# Patient Record
Sex: Male | Born: 1950 | Race: Black or African American | Hispanic: No | Marital: Single | Smoking: Never smoker
Health system: Southern US, Community
[De-identification: ages and names within clinical notes are randomized; demographics above are authoritative.]

## PROBLEM LIST (undated history)

## (undated) DIAGNOSIS — F79 Unspecified intellectual disabilities: Secondary | ICD-10-CM

## (undated) DIAGNOSIS — F209 Schizophrenia, unspecified: Secondary | ICD-10-CM

---

## 2011-04-20 DIAGNOSIS — J209 Acute bronchitis, unspecified: Secondary | ICD-10-CM | POA: Diagnosis not present

## 2011-04-24 DIAGNOSIS — F259 Schizoaffective disorder, unspecified: Secondary | ICD-10-CM | POA: Diagnosis not present

## 2011-05-08 DIAGNOSIS — J209 Acute bronchitis, unspecified: Secondary | ICD-10-CM | POA: Diagnosis not present

## 2011-05-18 DIAGNOSIS — H1130 Conjunctival hemorrhage, unspecified eye: Secondary | ICD-10-CM | POA: Diagnosis not present

## 2011-05-29 ENCOUNTER — Encounter (HOSPITAL_BASED_OUTPATIENT_CLINIC_OR_DEPARTMENT_OTHER): Payer: Self-pay | Admitting: Emergency Medicine

## 2011-05-29 ENCOUNTER — Emergency Department (HOSPITAL_BASED_OUTPATIENT_CLINIC_OR_DEPARTMENT_OTHER)
Admission: EM | Admit: 2011-05-29 | Discharge: 2011-05-29 | Disposition: A | Discharge status: Home / Self Care | Payer: Medicare Other | Attending: Emergency Medicine | Admitting: Emergency Medicine

## 2011-05-29 ENCOUNTER — Emergency Department (INDEPENDENT_AMBULATORY_CARE_PROVIDER_SITE_OTHER): Payer: Medicare Other

## 2011-05-29 DIAGNOSIS — R197 Diarrhea, unspecified: Secondary | ICD-10-CM | POA: Diagnosis not present

## 2011-05-29 DIAGNOSIS — E860 Dehydration: Secondary | ICD-10-CM

## 2011-05-29 DIAGNOSIS — R630 Anorexia: Secondary | ICD-10-CM | POA: Insufficient documentation

## 2011-05-29 DIAGNOSIS — F79 Unspecified intellectual disabilities: Secondary | ICD-10-CM | POA: Insufficient documentation

## 2011-05-29 DIAGNOSIS — F209 Schizophrenia, unspecified: Secondary | ICD-10-CM | POA: Diagnosis not present

## 2011-05-29 HISTORY — DX: Schizophrenia, unspecified: F20.9

## 2011-05-29 HISTORY — DX: Unspecified intellectual disabilities: F79

## 2011-05-29 LAB — COMPREHENSIVE METABOLIC PANEL
Albumin: 3.4 g/dL — ABNORMAL LOW (ref 3.5–5.2)
Alkaline Phosphatase: 80 U/L (ref 39–117)
BUN: 20 mg/dL (ref 6–23)
Potassium: 3.6 mEq/L (ref 3.5–5.1)
Sodium: 137 mEq/L (ref 135–145)
Total Protein: 7.1 g/dL (ref 6.0–8.3)

## 2011-05-29 LAB — URINE MICROSCOPIC-ADD ON

## 2011-05-29 LAB — URINALYSIS, ROUTINE W REFLEX MICROSCOPIC
Glucose, UA: NEGATIVE mg/dL
Ketones, ur: 15 mg/dL — AB
Leukocytes, UA: NEGATIVE
Specific Gravity, Urine: 1.034 — ABNORMAL HIGH (ref 1.005–1.030)
pH: 5.5 (ref 5.0–8.0)

## 2011-05-29 LAB — DIFFERENTIAL
Basophils Relative: 0 % (ref 0–1)
Eosinophils Absolute: 0.1 10*3/uL (ref 0.0–0.7)
Monocytes Relative: 7 % (ref 3–12)
Neutrophils Relative %: 83 % — ABNORMAL HIGH (ref 43–77)

## 2011-05-29 LAB — CBC
MCH: 32.3 pg (ref 26.0–34.0)
MCHC: 35.3 g/dL (ref 30.0–36.0)
Platelets: 154 10*3/uL (ref 150–400)

## 2011-05-29 MED ORDER — HALOPERIDOL LACTATE 5 MG/ML IJ SOLN
10.0000 mg | Freq: Once | INTRAMUSCULAR | Status: AC
  Administered 2011-05-29: 10 mg via INTRAMUSCULAR
  Filled 2011-05-29: qty 2

## 2011-05-29 MED ORDER — SODIUM CHLORIDE 0.9 % IV BOLUS (SEPSIS)
1000.0000 mL | Freq: Once | INTRAVENOUS | Status: AC
  Administered 2011-05-29: 1000 mL via INTRAVENOUS

## 2011-05-29 MED ORDER — LORAZEPAM 2 MG/ML IJ SOLN
2.0000 mg | Freq: Once | INTRAMUSCULAR | Status: AC
  Administered 2011-05-29: 2 mg via INTRAMUSCULAR
  Filled 2011-05-29: qty 1

## 2011-05-29 NOTE — ED Notes (Signed)
Eating mac and cheese. Drinking sprite

## 2011-05-29 NOTE — ED Notes (Signed)
Pt's caregiver reports pt has had a decreased appetite since yesterday; pt uncooperative in triage, but per caregiver, this is his baseline

## 2011-05-29 NOTE — Discharge Instructions (Signed)
Dehydration, Adult Dehydration is when you lose more fluids from the body than you take in. Vital organs like the kidneys, brain, and heart cannot function without a proper amount of fluids and salt. Any loss of fluids from the body can cause dehydration.  CAUSES   Vomiting.   Diarrhea.   Excessive sweating.   Excessive urine output.   Fever.  SYMPTOMS  Mild dehydration  Thirst.   Dry lips.   Slightly dry mouth.  Moderate dehydration  Very dry mouth.   Sunken eyes.   Skin does not bounce back quickly when lightly pinched and released.   Dark urine and decreased urine production.   Decreased tear production.   Headache.  Severe dehydration  Very dry mouth.   Extreme thirst.   Rapid, weak pulse (more than 100 beats per minute at rest).   Cold hands and feet.   Not able to sweat in spite of heat and temperature.   Rapid breathing.   Blue lips.   Confusion and lethargy.   Difficulty being awakened.   Minimal urine production.   No tears.  DIAGNOSIS  Your caregiver will diagnose dehydration based on your symptoms and your exam. Blood and urine tests will help confirm the diagnosis. The diagnostic evaluation should also identify the cause of dehydration. TREATMENT  Treatment of mild or moderate dehydration can often be done at home by increasing the amount of fluids that you drink. It is best to drink small amounts of fluid more often. Drinking too much at one time can make vomiting worse. Refer to the home care instructions below. Severe dehydration needs to be treated at the hospital where you will probably be given intravenous (IV) fluids that contain water and electrolytes. HOME CARE INSTRUCTIONS   Ask your caregiver about specific rehydration instructions.   Drink enough fluids to keep your urine clear or pale yellow.   Drink small amounts frequently if you have nausea and vomiting.   Eat as you normally do.   Avoid:   Foods or drinks high in  sugar.   Carbonated drinks.   Juice.   Extremely hot or cold fluids.   Drinks with caffeine.   Fatty, greasy foods.   Alcohol.   Tobacco.   Overeating.   Gelatin desserts.   Wash your hands well to avoid spreading bacteria and viruses.   Only take over-the-counter or prescription medicines for pain, discomfort, or fever as directed by your caregiver.   Ask your caregiver if you should continue all prescribed and over-the-counter medicines.   Keep all follow-up appointments with your caregiver.  SEEK MEDICAL CARE IF:  You have abdominal pain and it increases or stays in one area (localizes).   You have a rash, stiff neck, or severe headache.   You are irritable, sleepy, or difficult to awaken.   You are weak, dizzy, or extremely thirsty.  SEEK IMMEDIATE MEDICAL CARE IF:   You are unable to keep fluids down or you get worse despite treatment.   You have frequent episodes of vomiting or diarrhea.   You have blood or green matter (bile) in your vomit.   You have blood in your stool or your stool looks black and tarry.   You have not urinated in 6 to 8 hours, or you have only urinated a small amount of very dark urine.   You have a fever.   You faint.  MAKE SURE YOU:   Understand these instructions.   Will watch your condition.     Will get help right away if you are not doing well or get worse.  Document Released: 02/26/2005 Document Revised: 02/15/2011 Document Reviewed: 10/16/2010 [employer] Patient Information 2012 [employer]. 

## 2011-05-29 NOTE — ED Provider Notes (Signed)
History     CSN: [id number]  Arrival date & time 05/29/11  1613   First MD Initiated Contact with Patient 05/29/11 1707      Chief Complaint  Patient presents with  . Anorexia   Level 5 caveat baseline cognitive disfunction,   (Consider location/radiation/quality/duration/timing/severity/associated sxs/prior treatment) HPI  61yoM h/o schizophrenia, MR pw dec appetite. Per caregiver pt refusing to eat since yesterday. Only intermittently taking his medications. No other abnormal behavior, does not appear to be in pain. States that he has experienced 2-3 episodes of loose stool/diarrhea today. No vomiting. Does not appear to be in pain. No documented fever but "felt warm" earlier today per caregiver.  ED Notes, ED Provider Notes from 05/29/11 0000 to 05/29/11 16:32:07       Estelle June, RN 05/29/2011 16:30      Attempt to obtain VS by different staff members x 2 unsuccessful- charge nurse made aware         Estelle June, RN 05/29/2011 16:23      Pt's caregiver reports pt has had a decreased appetite since yesterday; pt uncooperative in triage, but per caregiver, this is his baseline    Past Medical History  Diagnosis Date  . Schizophrenia   . MR (mental retardation)     History reviewed. No pertinent past surgical history.  No family history on file.  History  Substance Use Topics  . Smoking status: Never Smoker   . Smokeless tobacco: Not on file  . Alcohol Use: No      Review of Systems  Unable to perform ROS: Psychiatric disorder   Unable to obtain   Allergies  Review of patient's allergies indicates no known allergies.  Home Medications   Current Outpatient Rx  Name Route Sig Dispense Refill  . BENZTROPINE MESYLATE 2 MG PO TABS Oral Take 2 mg by mouth daily.    Marland Kitchen HALOPERIDOL 5 MG PO TABS Oral Take 25 mg by mouth at bedtime.    Marland Kitchen LAMOTRIGINE 100 MG PO TABS Oral Take 100 mg by mouth 2 (two) times daily.    Marland Kitchen LAMOTRIGINE 25 MG PO TABS Oral Take  25-50 mg by mouth 2 (two) times daily. 1 in the morning and 2 at bedtime    . OMEPRAZOLE 20 MG PO CPDR Oral Take 20 mg by mouth daily.    Marland Kitchen OXCARBAZEPINE 300 MG PO TABS Oral Take 300-600 mg by mouth 2 (two) times daily. 1 tablet in the morning and 2 in the evening    . CALCIUM POLYCARBOPHIL 625 MG PO TABS Oral Take 625 mg by mouth daily.    Marland Kitchen POTASSIUM CHLORIDE CRYS ER 10 MEQ PO TBCR Oral Take 10 mEq by mouth every morning.    Marland Kitchen RISPERIDONE 2 MG PO TABS Oral Take 2 mg by mouth 2 (two) times daily. For agitation    . RISPERIDONE 3 MG PO TABS Oral Take 3 mg by mouth 2 (two) times daily.    . TRAZODONE HCL 100 MG PO TABS Oral Take 100 mg by mouth at bedtime.      BP 138/70  Pulse 91  Resp 20  SpO2 100%  Physical Exam  Constitutional: He appears well-developed.  HENT:       Mm dry, refusing to open mouth, clenching down No stridor No neck LAD  Eyes: Pupils are equal, round, and reactive to light.  Neck: Neck supple.  Cardiovascular: Normal rate, regular rhythm and normal heart sounds.   Pulmonary/Chest: Effort normal.  No respiratory distress. He has no wheezes. He has no rales.  Abdominal: Soft. He exhibits no distension. There is no tenderness. There is no rebound and no guarding.  Musculoskeletal: He exhibits no edema and no tenderness.  Lymphadenopathy:    He has no cervical adenopathy.  Skin: No rash noted.    ED Course  Procedures (including critical care time)  Labs Reviewed  CBC - Abnormal; Notable for the following:    RBC 3.93 (*)    Hemoglobin 12.7 (*)    HCT 36.0 (*)    All other components within normal limits  DIFFERENTIAL - Abnormal; Notable for the following:    Neutrophils Relative 83 (*)    Lymphocytes Relative 9 (*)    Lymphs Abs 0.5 (*)    All other components within normal limits  COMPREHENSIVE METABOLIC PANEL - Abnormal; Notable for the following:    Albumin 3.4 (*)    AST 40 (*)    GFR calc non Af Amer 65 (*)    GFR calc Af Amer 75 (*)    All other  components within normal limits  URINALYSIS, ROUTINE W REFLEX MICROSCOPIC - Abnormal; Notable for the following:    Color, Urine AMBER (*) BIOCHEMICALS MAY BE AFFECTED BY COLOR   Specific Gravity, Urine 1.034 (*)    Hgb urine dipstick MODERATE (*)    Bilirubin Urine SMALL (*)    Ketones, ur 15 (*)    Protein, ur 30 (*)    All other components within normal limits  URINE MICROSCOPIC-ADD ON   Dg Chest Portable 1 View  05/29/2011  *RADIOLOGY REPORT*  Clinical Data: Decreased appetite.  Diarrhea.  PORTABLE CHEST - 1 VIEW  Comparison: 10/09/2003  Findings: Exam detail diminished due to rotational artifact.  Heart size and mediastinal contours are normal.  No pleural effusion or pulmonary edema.  Diminished lung volumes.  IMPRESSION:  1.  No acute findings identified. 2.  Rotational artifact.  Original Report Authenticated By: Rosealee Albee, M.D.    1. Poor appetite   2. Diarrhea   3. Dehydration     MDM  Suboptimal ED evaluation. Pt agitated and hostile, not allowing for complete vitals. Given  Haldol IM and subsequent Ativan for sedation, which was minimal. Did allow for Labs to be drawn and for I/O cath urine, IVF. Labs reviewed and generally unremarkable. Hgb likely 2/2 I/O cath. Small bili but total bili unremarkable. Unable to obtain adequate view of posterior OP or TM b/l but pt in NAD. Tolerating PO prior to discharge. I do not feel that further sedation would benefit the patient at this time. Caregiver advised to f/u with his PMD. Given CXR results as well as lab results. Pt stable for discharge home.        Forbes Cellar, MD 05/30/11 [phone]

## 2011-05-29 NOTE — ED Notes (Signed)
Was able to clean stool from pt after he was incontinent. He is still too combative to start an IV and draw blood.

## 2011-05-29 NOTE — ED Notes (Signed)
Attempt to obtain VS by different staff members x 2 unsuccessful- charge nurse made aware

## 2011-05-29 NOTE — ED Notes (Signed)
Pt was able to take 2 sips of his crystal light drink. Pt is aggressive towards staff and only follows some direction. Removed his iv himself

## 2011-05-29 NOTE — ED Notes (Signed)
Pt is ambulatory to treatment area with caregiver. Very active. Becomes agitated if anyone moves toward him to attempt VS or other care.

## 2011-06-17 ENCOUNTER — Encounter (HOSPITAL_BASED_OUTPATIENT_CLINIC_OR_DEPARTMENT_OTHER): Payer: Self-pay | Admitting: Emergency Medicine

## 2011-06-17 ENCOUNTER — Emergency Department (INDEPENDENT_AMBULATORY_CARE_PROVIDER_SITE_OTHER): Payer: Medicare Other

## 2011-06-17 ENCOUNTER — Emergency Department (HOSPITAL_BASED_OUTPATIENT_CLINIC_OR_DEPARTMENT_OTHER)
Admission: EM | Admit: 2011-06-17 | Discharge: 2011-06-17 | Disposition: A | Discharge status: Home / Self Care | Payer: Medicare Other | Attending: Emergency Medicine | Admitting: Emergency Medicine

## 2011-06-17 DIAGNOSIS — F209 Schizophrenia, unspecified: Secondary | ICD-10-CM | POA: Insufficient documentation

## 2011-06-17 DIAGNOSIS — R05 Cough: Secondary | ICD-10-CM

## 2011-06-17 DIAGNOSIS — Z79899 Other long term (current) drug therapy: Secondary | ICD-10-CM | POA: Diagnosis not present

## 2011-06-17 DIAGNOSIS — R197 Diarrhea, unspecified: Secondary | ICD-10-CM | POA: Diagnosis not present

## 2011-06-17 DIAGNOSIS — F79 Unspecified intellectual disabilities: Secondary | ICD-10-CM | POA: Insufficient documentation

## 2011-06-17 DIAGNOSIS — J40 Bronchitis, not specified as acute or chronic: Secondary | ICD-10-CM

## 2011-06-17 DIAGNOSIS — R059 Cough, unspecified: Secondary | ICD-10-CM

## 2011-06-17 DIAGNOSIS — J209 Acute bronchitis, unspecified: Secondary | ICD-10-CM | POA: Diagnosis not present

## 2011-06-17 MED ORDER — AZITHROMYCIN 250 MG PO TABS: 250.0000 mg | ORAL_TABLET | Freq: Every day | ORAL | Status: AC

## 2011-06-17 MED ORDER — DIPHENOXYLATE-ATROPINE 2.5-0.025 MG PO TABS
1.0000 | ORAL_TABLET | Freq: Once | ORAL | Status: AC
  Administered 2011-06-17: 1 via ORAL
  Filled 2011-06-17: qty 1

## 2011-06-17 MED ORDER — DIPHENOXYLATE-ATROPINE 2.5-0.025 MG PO TABS: 1.0000 | ORAL_TABLET | Freq: Four times a day (QID) | ORAL | Status: AC | PRN

## 2011-06-17 NOTE — ED Notes (Signed)
Pt uncooperative, unable to obtain full set VS

## 2011-06-17 NOTE — Discharge Instructions (Signed)
Bronchitis Bronchitis is the body's way of reacting to injury and/or infection (inflammation) of the bronchi. Bronchi are the air tubes that extend from the windpipe into the lungs. If the inflammation becomes severe, it may cause shortness of breath. CAUSES  Inflammation may be caused by:  A virus.   Germs (bacteria).   Dust.   Allergens.   Pollutants and many other irritants.  The cells lining the bronchial tree are covered with tiny hairs (cilia). These constantly beat upward, away from the lungs, toward the mouth. This keeps the lungs free of pollutants. When these cells become too irritated and are unable to do their job, mucus begins to develop. This causes the characteristic cough of bronchitis. The cough clears the lungs when the cilia are unable to do their job. Without either of these protective mechanisms, the mucus would settle in the lungs. Then you would develop pneumonia. Smoking is a common cause of bronchitis and can contribute to pneumonia. Stopping this habit is the single most important thing you can do to help yourself. TREATMENT   Your caregiver may prescribe an antibiotic if the cough is caused by bacteria. Also, medicines that open up your airways make it easier to breathe. Your caregiver may also recommend or prescribe an expectorant. It will loosen the mucus to be coughed up. Only take over-the-counter or prescription medicines for pain, discomfort, or fever as directed by your caregiver.   Removing whatever causes the problem (smoking, for example) is critical to preventing the problem from getting worse.   Cough suppressants may be prescribed for relief of cough symptoms.   Inhaled medicines may be prescribed to help with symptoms now and to help prevent problems from returning.   For those with recurrent (chronic) bronchitis, there may be a need for steroid medicines.  SEEK IMMEDIATE MEDICAL CARE IF:   During treatment, you develop more pus-like mucus  (purulent sputum).   You have a fever.   Your baby is older than 3 months with a rectal temperature of 102 F (38.9 C) or higher.   Your baby is 3 months old or younger with a rectal temperature of 100.4 F (38 C) or higher.   You become progressively more ill.   You have increased difficulty breathing, wheezing, or shortness of breath.  It is necessary to seek immediate medical care if you are elderly or sick from any other disease. MAKE SURE YOU:   Understand these instructions.   Will watch your condition.   Will get help right away if you are not doing well or get worse.  Document Released: 02/26/2005 Document Revised: 02/15/2011 Document Reviewed: 01/06/2008 [employer] Patient Information 2012 [employer].Diarrhea Infections caused by germs (bacterial) or a virus commonly cause diarrhea. Your caregiver has determined that with time, rest and fluids, the diarrhea should improve. In general, eat normally while drinking more water than usual. Although water may prevent dehydration, it does not contain salt and minerals (electrolytes). Broths, weak tea without caffeine and oral rehydration solutions (ORS) replace fluids and electrolytes. Small amounts of fluids should be taken frequently. Large amounts at one time may not be tolerated. Plain water may be harmful in infants and the elderly. Oral rehydrating solutions (ORS) are available at pharmacies and grocery stores. ORS replace water and important electrolytes in proper proportions. Sports drinks are not as effective as ORS and may be harmful due to sugars worsening diarrhea.  ORS is especially recommended for use in children with diarrhea. As a general guideline for   children, replace any new fluid losses from diarrhea and/or vomiting with ORS as follows:   If your child weighs 22 pounds or under (10 kg or less), give 60-120 mL ( -  cup or 2 - 4 ounces) of ORS for each episode of diarrheal stool or vomiting episode.   If your  child weighs more than 22 pounds (more than 10 kgs), give 120-240 mL ( - 1 cup or 4 - 8 ounces) of ORS for each diarrheal stool or episode of vomiting.   While correcting for dehydration, children should eat normally. However, foods high in sugar should be avoided because this may worsen diarrhea. Large amounts of carbonated soft drinks, juice, gelatin desserts and other highly sugared drinks should be avoided.   After correction of dehydration, other liquids that are appealing to the child may be added. Children should drink small amounts of fluids frequently and fluids should be increased as tolerated. Children should drink enough fluids to keep urine clear or pale yellow.   Adults should eat normally while drinking more fluids than usual. Drink small amounts of fluids frequently and increase as tolerated. Drink enough fluids to keep urine clear or pale yellow. Broths, weak decaffeinated tea, lemon lime soft drinks (allowed to go flat) and ORS replace fluids and electrolytes.   Avoid:   Carbonated drinks.   Juice.   Extremely hot or cold fluids.   Caffeine drinks.   Fatty, greasy foods.   Alcohol.   Tobacco.   Too much intake of anything at one time.   Gelatin desserts.   Probiotics are active cultures of beneficial bacteria. They may lessen the amount and number of diarrheal stools in adults. Probiotics can be found in yogurt with active cultures and in supplements.   Wash hands well to avoid spreading bacteria and virus.   Anti-diarrheal medications are not recommended for infants and children.   Only take over-the-counter or prescription medicines for pain, discomfort or fever as directed by your caregiver. Do not give aspirin to children because it may cause Reye's Syndrome.   For adults, ask your caregiver if you should continue all prescribed and over-the-counter medicines.   If your caregiver has given you a follow-up appointment, it is very important to keep that  appointment. Not keeping the appointment could result in a chronic or permanent injury, and disability. If there is any problem keeping the appointment, you must call back to this facility for assistance.  SEEK IMMEDIATE MEDICAL CARE IF:   You or your child is unable to keep fluids down or other symptoms or problems become worse in spite of treatment.   Vomiting or diarrhea develops and becomes persistent.   There is vomiting of blood or bile (green material).   There is blood in the stool or the stools are black and tarry.   There is no urine output in 6-8 hours or there is only a small amount of very dark urine.   Abdominal pain develops, increases or localizes.   You have a fever.   Your baby is older than 3 months with a rectal temperature of 102 F (38.9 C) or higher.   Your baby is 3 months old or younger with a rectal temperature of 100.4 F (38 C) or higher.   You or your child develops excessive weakness, dizziness, fainting or extreme thirst.   You or your child develops a rash, stiff neck, severe headache or become irritable or sleepy and difficult to awaken.  MAKE SURE YOU:     Understand these instructions.   Will watch your condition.   Will get help right away if you are not doing well or get worse.  Document Released: 02/16/2002 Document Revised: 02/15/2011 Document Reviewed: 01/03/2009 [employer] Patient Information 2012 [employer]. 

## 2011-06-17 NOTE — ED Notes (Signed)
Pt caregiver reports pt has had 2 episodes of diarrhea this am

## 2011-06-17 NOTE — ED Provider Notes (Signed)
History     CSN: [id number]  Arrival date & time 06/17/11  0857   First MD Initiated Contact with Patient 06/17/11 0901      Chief Complaint  Patient presents with  . Diarrhea    (Consider location/radiation/quality/duration/timing/severity/associated sxs/prior treatment) HPI Comments: Has been on Lomotil in the past which has helped. Has also had a cold for the past 4 days. Level V caveat secondary to MR. History was obtained from the patient's caregiver.  Patient is a 61 y.o. male presenting with diarrhea. The history is provided by a caregiver. No language interpreter was used.  Diarrhea The primary symptoms include diarrhea. Primary symptoms do not include fever, fatigue or abdominal pain. The illness began today. The onset was gradual. The problem has been gradually worsening.  The diarrhea began today. The diarrhea is watery. The diarrhea occurs 2 to 4 times per day.  The illness does not include anorexia, constipation or back pain.    Past Medical History  Diagnosis Date  . Schizophrenia   . MR (mental retardation)     History reviewed. No pertinent past surgical history.  No family history on file.  History  Substance Use Topics  . Smoking status: Never Smoker   . Smokeless tobacco: Not on file  . Alcohol Use: No      Review of Systems  Unable to perform ROS: Psychiatric disorder  Constitutional: Negative for fever and fatigue.  HENT: Positive for congestion and rhinorrhea.   Respiratory: Positive for cough.   Gastrointestinal: Positive for diarrhea. Negative for abdominal pain, constipation and anorexia.  Musculoskeletal: Negative for back pain.    Allergies  Review of patient's allergies indicates no known allergies.  Home Medications   Current Outpatient Rx  Name Route Sig Dispense Refill  . AZITHROMYCIN 250 MG PO TABS Oral Take 1 tablet (250 mg total) by mouth daily. Take first 2 tablets together, then 1 every day until finished. 6 tablet 0  .  BENZTROPINE MESYLATE 2 MG PO TABS Oral Take 2 mg by mouth daily.    Marland Kitchen DIPHENOXYLATE-ATROPINE 2.5-0.025 MG PO TABS Oral Take 1 tablet by mouth 4 (four) times daily as needed for diarrhea or loose stools. 30 tablet 0  . HALOPERIDOL 5 MG PO TABS Oral Take 25 mg by mouth at bedtime.    Marland Kitchen LAMOTRIGINE 100 MG PO TABS Oral Take 100 mg by mouth 2 (two) times daily.    Marland Kitchen LAMOTRIGINE 25 MG PO TABS Oral Take 25-50 mg by mouth 2 (two) times daily. 1 in the morning and 2 at bedtime    . OMEPRAZOLE 20 MG PO CPDR Oral Take 20 mg by mouth daily.    Marland Kitchen OXCARBAZEPINE 300 MG PO TABS Oral Take 300-600 mg by mouth 2 (two) times daily. 1 tablet in the morning and 2 in the evening    . CALCIUM POLYCARBOPHIL 625 MG PO TABS Oral Take 625 mg by mouth daily.    Marland Kitchen POTASSIUM CHLORIDE CRYS ER 10 MEQ PO TBCR Oral Take 10 mEq by mouth every morning.    Marland Kitchen RISPERIDONE 2 MG PO TABS Oral Take 2 mg by mouth 2 (two) times daily. For agitation    . RISPERIDONE 3 MG PO TABS Oral Take 3 mg by mouth 2 (two) times daily.    . TRAZODONE HCL 100 MG PO TABS Oral Take 100 mg by mouth at bedtime.      BP 146/81  Resp 20  Physical Exam  Constitutional: He appears well-developed and  well-nourished. No distress.  HENT:  Head: Normocephalic and atraumatic.  Mouth/Throat: Oropharynx is clear and moist.  Eyes: Conjunctivae and EOM are normal. Pupils are equal, round, and reactive to light.  Neck: Normal range of motion. Neck supple.  Cardiovascular: Normal rate, regular rhythm, normal heart sounds and intact distal pulses.  Exam reveals no gallop and no friction rub.   No murmur heard. Pulmonary/Chest: Effort normal and breath sounds normal. No respiratory distress. He exhibits no tenderness.  Abdominal: Soft. Bowel sounds are normal. There is no tenderness.  Musculoskeletal: Normal range of motion. He exhibits no edema and no tenderness.  Neurological:       MR unable to obtain history.  Little cooperation  Skin: Skin is warm and dry. No  rash noted.    ED Course  Procedures (including critical care time)  Labs Reviewed - No data to display Dg Chest 1 View  06/17/2011  *RADIOLOGY REPORT*  Clinical Data: Cough, diarrhea  CHEST - 1 VIEW  Comparison: 05/29/2011  Findings: Cardiomediastinal silhouette is unremarkable.  No acute infiltrate or pleural effusion.  Mild perihilar and infrahilar increased bronchial markings suspicious for mild bronchitic changes.  IMPRESSION: No acute infiltrate or pulmonary edema.  Mild perihilar and infrahilar increased bronchial markings suspicious for bronchitic changes.  Original Report Authenticated By: Natasha Mead, M.D.     1. Bronchitis   2. Diarrhea       MDM  Bronchitis and diarrhea. Is no indication for laboratory testing at this time. Chest x-ray with no evidence of pneumonia. Will be placed on azithromycin per the caregivers request. Will be prescribed Lomotil for diarrhea. Instructed to followup with his primary care physician.        Dayton Bailiff, MD 06/17/11 [phone]

## 2011-07-31 DIAGNOSIS — K529 Noninfective gastroenteritis and colitis, unspecified: Secondary | ICD-10-CM | POA: Diagnosis not present

## 2011-08-07 DIAGNOSIS — F259 Schizoaffective disorder, unspecified: Secondary | ICD-10-CM | POA: Diagnosis not present

## 2011-08-08 DIAGNOSIS — K529 Noninfective gastroenteritis and colitis, unspecified: Secondary | ICD-10-CM | POA: Diagnosis not present

## 2011-10-22 DIAGNOSIS — F259 Schizoaffective disorder, unspecified: Secondary | ICD-10-CM | POA: Diagnosis not present

## 2011-11-08 DIAGNOSIS — H1130 Conjunctival hemorrhage, unspecified eye: Secondary | ICD-10-CM | POA: Diagnosis not present

## 2011-11-08 DIAGNOSIS — S01119A Laceration without foreign body of unspecified eyelid and periocular area, initial encounter: Secondary | ICD-10-CM | POA: Diagnosis not present

## 2011-11-23 DIAGNOSIS — A09 Infectious gastroenteritis and colitis, unspecified: Secondary | ICD-10-CM | POA: Diagnosis not present

## 2012-01-09 DIAGNOSIS — R5381 Other malaise: Secondary | ICD-10-CM | POA: Diagnosis not present

## 2012-01-09 DIAGNOSIS — R5383 Other fatigue: Secondary | ICD-10-CM | POA: Diagnosis not present

## 2012-01-11 ENCOUNTER — Emergency Department (HOSPITAL_BASED_OUTPATIENT_CLINIC_OR_DEPARTMENT_OTHER)
Admission: EM | Admit: 2012-01-11 | Discharge: 2012-01-12 | Disposition: A | Discharge status: Home / Self Care | Payer: Medicare Other | Attending: Emergency Medicine | Admitting: Emergency Medicine

## 2012-01-11 ENCOUNTER — Encounter (HOSPITAL_BASED_OUTPATIENT_CLINIC_OR_DEPARTMENT_OTHER): Payer: Self-pay | Admitting: *Deleted

## 2012-01-11 DIAGNOSIS — Z79899 Other long term (current) drug therapy: Secondary | ICD-10-CM | POA: Diagnosis not present

## 2012-01-11 DIAGNOSIS — N390 Urinary tract infection, site not specified: Secondary | ICD-10-CM | POA: Diagnosis not present

## 2012-01-11 DIAGNOSIS — R3911 Hesitancy of micturition: Secondary | ICD-10-CM | POA: Insufficient documentation

## 2012-01-11 DIAGNOSIS — R338 Other retention of urine: Secondary | ICD-10-CM | POA: Insufficient documentation

## 2012-01-11 DIAGNOSIS — R319 Hematuria, unspecified: Secondary | ICD-10-CM | POA: Insufficient documentation

## 2012-01-11 DIAGNOSIS — F209 Schizophrenia, unspecified: Secondary | ICD-10-CM | POA: Insufficient documentation

## 2012-01-11 DIAGNOSIS — F79 Unspecified intellectual disabilities: Secondary | ICD-10-CM | POA: Diagnosis not present

## 2012-01-11 DIAGNOSIS — R339 Retention of urine, unspecified: Secondary | ICD-10-CM | POA: Diagnosis not present

## 2012-01-11 MED ORDER — LORAZEPAM 2 MG/ML IJ SOLN
1.0000 mg | Freq: Once | INTRAMUSCULAR | Status: AC
  Administered 2012-01-11: 1 mg via INTRAMUSCULAR

## 2012-01-11 MED ORDER — LORAZEPAM 2 MG/ML IJ SOLN
INTRAMUSCULAR | Status: AC
  Filled 2012-01-11: qty 1

## 2012-01-11 NOTE — ED Provider Notes (Signed)
History     CSN: [id number]  Arrival date & time 01/11/12  2314   First MD Initiated Contact with Patient 01/11/12 2336      Chief Complaint  Patient presents with  . Urinary Retention    (Consider location/radiation/quality/duration/timing/severity/associated sxs/prior treatment) Patient is a 61 y.o. male presenting with dysuria. The history is provided by a caregiver. The history is limited by the condition of the patient. No language interpreter was used.  Dysuria  This is a new problem. The current episode started 12 to 24 hours ago. Episode frequency: unable to pass urine today. The problem has not changed since onset.There has been no fever. He is not sexually active. There is no history of pyelonephritis. Associated symptoms include hematuria and hesitancy. Associated symptoms comments: retention. He has tried nothing for the symptoms. His past medical history does not include kidney stones.  Caregiver states he seems in pain when he attempts to urinate and has not been urinating today  Past Medical History  Diagnosis Date  . Schizophrenia   . MR (mental retardation)     History reviewed. No pertinent past surgical history.  History reviewed. No pertinent family history.  History  Substance Use Topics  . Smoking status: Never Smoker   . Smokeless tobacco: Not on file  . Alcohol Use: No      Review of Systems  Unable to perform ROS Genitourinary: Positive for dysuria, hesitancy and hematuria.    Allergies  Review of patient's allergies indicates no known allergies.  Home Medications   Current Outpatient Rx  Name Route Sig Dispense Refill  . BENZTROPINE MESYLATE 2 MG PO TABS Oral Take 2 mg by mouth daily.    Marland Kitchen HALOPERIDOL 5 MG PO TABS Oral Take 25 mg by mouth at bedtime.    Marland Kitchen LAMOTRIGINE 100 MG PO TABS Oral Take 100 mg by mouth 2 (two) times daily.    Marland Kitchen LAMOTRIGINE 25 MG PO TABS Oral Take 25-50 mg by mouth 2 (two) times daily. 1 in the morning and 2 at  bedtime    . OMEPRAZOLE 20 MG PO CPDR Oral Take 20 mg by mouth daily.    Marland Kitchen OXCARBAZEPINE 300 MG PO TABS Oral Take 300-600 mg by mouth 2 (two) times daily. 1 tablet in the morning and 2 in the evening    . CALCIUM POLYCARBOPHIL 625 MG PO TABS Oral Take 625 mg by mouth daily.    Marland Kitchen POTASSIUM CHLORIDE CRYS ER 10 MEQ PO TBCR Oral Take 10 mEq by mouth every morning.    Marland Kitchen RISPERIDONE 2 MG PO TABS Oral Take 2 mg by mouth 2 (two) times daily. For agitation    . RISPERIDONE 3 MG PO TABS Oral Take 3 mg by mouth 2 (two) times daily.    . TRAZODONE HCL 100 MG PO TABS Oral Take 100 mg by mouth at bedtime.      There were no vitals taken for this visit.  Physical Exam  Constitutional: He appears well-developed and well-nourished. No distress.  HENT:  Head: Normocephalic and atraumatic.  Right Ear: External ear normal.  Left Ear: External ear normal.  Eyes: Conjunctivae normal are normal. Pupils are equal, round, and reactive to light.  Neck: No tracheal deviation present.  Cardiovascular: Normal rate, regular rhythm and intact distal pulses.   Pulmonary/Chest: Effort normal and breath sounds normal. He has no wheezes. He has no rales.  Abdominal: Soft. Bowel sounds are normal. He exhibits no distension.  Musculoskeletal: Normal range of  motion.  Neurological: He is alert.  Skin: Skin is warm and dry.    ED Course  Procedures (including critical care time)   Labs Reviewed  CBC WITH DIFFERENTIAL  BASIC METABOLIC PANEL  URINALYSIS, MICROSCOPIC ONLY  URINALYSIS, ROUTINE W REFLEX MICROSCOPIC  URINE CULTURE   No results found.   No diagnosis found.    MDM  Urinary retention, suspect UTI as cause.  Follow up with urology for catheter removal.  Antibiotics started.  Culture sent       Larya Charpentier K Cason Luffman-Rasch, MD 01/12/12 [phone]

## 2012-01-11 NOTE — ED Notes (Signed)
Pt is from group home , care giver states pt unable to urinate

## 2012-01-12 ENCOUNTER — Encounter (HOSPITAL_BASED_OUTPATIENT_CLINIC_OR_DEPARTMENT_OTHER): Payer: Self-pay | Admitting: Emergency Medicine

## 2012-01-12 LAB — URINALYSIS, MICROSCOPIC ONLY
Nitrite: NEGATIVE
Specific Gravity, Urine: 1.012 (ref 1.005–1.030)
Urobilinogen, UA: 1 mg/dL (ref 0.0–1.0)
pH: 8 (ref 5.0–8.0)

## 2012-01-12 LAB — CBC WITH DIFFERENTIAL/PLATELET
Basophils Absolute: 0 10*3/uL (ref 0.0–0.1)
Basophils Relative: 0 % (ref 0–1)
Eosinophils Absolute: 0 10*3/uL (ref 0.0–0.7)
Hemoglobin: 12.9 g/dL — ABNORMAL LOW (ref 13.0–17.0)
MCHC: 36 g/dL (ref 30.0–36.0)
Neutro Abs: 11.5 10*3/uL — ABNORMAL HIGH (ref 1.7–7.7)
Neutrophils Relative %: 85 % — ABNORMAL HIGH (ref 43–77)
Platelets: 130 10*3/uL — ABNORMAL LOW (ref 150–400)
RDW: 11.2 % — ABNORMAL LOW (ref 11.5–15.5)

## 2012-01-12 LAB — BASIC METABOLIC PANEL
Chloride: 100 mEq/L (ref 96–112)
GFR calc Af Amer: 83 mL/min — ABNORMAL LOW (ref >90)
GFR calc non Af Amer: 72 mL/min — ABNORMAL LOW (ref >90)
Potassium: 3.7 mEq/L (ref 3.5–5.1)
Sodium: 139 mEq/L (ref 135–145)

## 2012-01-12 MED ORDER — CIPROFLOXACIN HCL 500 MG PO TABS: 500.0000 mg | ORAL_TABLET | Freq: Two times a day (BID) | ORAL | Status: DC

## 2012-01-12 MED ORDER — LORAZEPAM 2 MG/ML IJ SOLN
1.0000 mg | Freq: Once | INTRAMUSCULAR | Status: AC
  Administered 2012-01-12: 1 mg via INTRAMUSCULAR
  Filled 2012-01-12: qty 1

## 2012-01-12 MED ORDER — HALOPERIDOL LACTATE 5 MG/ML IJ SOLN
5.0000 mg | Freq: Once | INTRAMUSCULAR | Status: AC
  Administered 2012-01-12: 5 mg via INTRAMUSCULAR
  Filled 2012-01-12: qty 1

## 2012-01-12 MED ORDER — HALOPERIDOL LACTATE 5 MG/ML IJ SOLN
INTRAMUSCULAR | Status: AC
  Administered 2012-01-12: 5 mg
  Filled 2012-01-12: qty 1

## 2012-01-12 NOTE — ED Notes (Signed)
POA called via by the caregiver concerning placing a foley in pt.Verbalized consent.

## 2012-01-12 NOTE — ED Notes (Signed)
14 f foley placed without difficulty, 10cc used to inflate balloon, straw colored urine with foul smell returned,

## 2012-01-14 ENCOUNTER — Emergency Department (HOSPITAL_COMMUNITY)
Admission: EM | Admit: 2012-01-14 | Discharge: 2012-01-14 | Disposition: A | Discharge status: Home / Self Care | Payer: Medicare Other | Attending: Emergency Medicine | Admitting: Emergency Medicine

## 2012-01-14 DIAGNOSIS — Z466 Encounter for fitting and adjustment of urinary device: Secondary | ICD-10-CM | POA: Insufficient documentation

## 2012-01-14 DIAGNOSIS — T83091A Other mechanical complication of indwelling urethral catheter, initial encounter: Secondary | ICD-10-CM | POA: Diagnosis not present

## 2012-01-14 DIAGNOSIS — F209 Schizophrenia, unspecified: Secondary | ICD-10-CM | POA: Insufficient documentation

## 2012-01-14 DIAGNOSIS — IMO0002 Reserved for concepts with insufficient information to code with codable children: Secondary | ICD-10-CM | POA: Diagnosis not present

## 2012-01-14 DIAGNOSIS — Z79899 Other long term (current) drug therapy: Secondary | ICD-10-CM | POA: Diagnosis not present

## 2012-01-14 DIAGNOSIS — F79 Unspecified intellectual disabilities: Secondary | ICD-10-CM | POA: Insufficient documentation

## 2012-01-14 DIAGNOSIS — F919 Conduct disorder, unspecified: Secondary | ICD-10-CM | POA: Insufficient documentation

## 2012-01-14 NOTE — ED Provider Notes (Signed)
History     CSN: [id number]  Arrival date & time 01/14/12  1356   First MD Initiated Contact with Patient 01/14/12 1548      No chief complaint on file.   (Consider location/radiation/quality/duration/timing/severity/associated sxs/prior treatment) The history is provided by the patient. No language interpreter was used.   61yo male with MR/schizophrenia here with caregiver ? Whether the patient pulled his catheter out.  States that another caregiver saw him pulling on the tube. Patient was given antibiotics 3 days ago and had a indwelling catheter placed because he was having urinary retention.  Has appointment this week with urology. Patient was sedated at [facility] med center to have catheter placed.  Patient is not cooperative today.  Culture shows > 100,000 and gram - rods.    Past Medical History  Diagnosis Date  . Schizophrenia   . MR (mental retardation)     No past surgical history on file.  No family history on file.  History  Substance Use Topics  . Smoking status: Never Smoker   . Smokeless tobacco: Not on file  . Alcohol Use: No      Review of Systems  Constitutional: Negative.   HENT: Negative.   Eyes: Negative.   Respiratory: Negative.   Cardiovascular: Negative.   Gastrointestinal: Negative.   Genitourinary:       Indwelling catheter with leg bag  Neurological: Negative.   Psychiatric/Behavioral: Positive for behavioral problems, decreased concentration and agitation.  All other systems reviewed and are negative.    Allergies  Review of patient's allergies indicates no known allergies.  Home Medications   Current Outpatient Rx  Name  Route  Sig  Dispense  Refill  . BENZTROPINE MESYLATE 2 MG PO TABS   Oral   Take 2 mg by mouth 2 (two) times daily.          Marland Kitchen CIPROFLOXACIN HCL 500 MG PO TABS   Oral   Take 500 mg by mouth 2 (two) times daily. Started on 01-12-12 for 7 days pt is on day 1 of therapy         . HALOPERIDOL 5 MG PO  TABS   Oral   Take 25 mg by mouth at bedtime.         Marland Kitchen LAMOTRIGINE 100 MG PO TABS   Oral   Take 100 mg by mouth 2 (two) times daily.         Marland Kitchen LAMOTRIGINE 25 MG PO TABS   Oral   Take 25-50 mg by mouth 2 (two) times daily. 1 in the morning and 2 at bedtime         . OMEPRAZOLE 20 MG PO CPDR   Oral   Take 20 mg by mouth daily.         Marland Kitchen OXCARBAZEPINE 300 MG PO TABS   Oral   Take 300-600 mg by mouth 2 (two) times daily. 1 tablet in the morning and 2 in the evening         . CALCIUM POLYCARBOPHIL 625 MG PO TABS   Oral   Take 625 mg by mouth daily.         Marland Kitchen POTASSIUM CHLORIDE CRYS ER 10 MEQ PO TBCR   Oral   Take 10 mEq by mouth every morning.         Marland Kitchen RISPERIDONE 2 MG PO TABS   Oral   Take 2 mg by mouth 2 (two) times daily. For agitation         .  RISPERIDONE 3 MG PO TABS   Oral   Take 3 mg by mouth 2 (two) times daily.         . TRAZODONE HCL 100 MG PO TABS   Oral   Take 100 mg by mouth at bedtime.           There were no vitals taken for this visit.  Physical Exam  Nursing note and vitals reviewed. Constitutional: He is oriented to person, place, and time. He appears well-developed and well-nourished.  HENT:  Head: Normocephalic.  Eyes: Conjunctivae normal and EOM are normal. Pupils are equal, round, and reactive to light.  Neck: Normal range of motion. Neck supple.  Cardiovascular: Normal rate.   Pulmonary/Chest: Effort normal.  Abdominal: Soft.  Genitourinary: Penis normal. No penile tenderness.       Indwelling catheter with leg bag in tact.  Patient difficult to work with aggitated.  Did not feel balloon in his penis.  Catheter draining normally.  Reattached to patients leg in ER.  Did not take water out of balloon due to aggitation to risky he would pull it out without sedation.    Musculoskeletal: Normal range of motion.  Neurological: He is alert and oriented to person, place, and time.  Skin: Skin is warm and dry.  Psychiatric:  His mood appears anxious. He is agitated and aggressive. Cognition and memory are impaired. He expresses impulsivity. He expresses no homicidal and no suicidal ideation. He expresses no suicidal plans and no homicidal plans. He is noncommunicative.       Aggitative and combative at times.  Calmed down after caregiver and I talked with him and I was able to examine indwelling foley.    ED Course  Procedures (including critical care time)  Labs Reviewed - No data to display No results found.   1. Foley catheter in place       MDM  Indwelling catheter check in the ER to see if it had been dislodged.  Difficult assessment without sedation due to MR/schizophrenia but no palpable balloon in penis and catheter is draining urine without difficulty.  Follow up with urology tomorrow.  Return if not draining or passing blood clots.          Remi Haggard, NP 01/14/12 1645

## 2012-01-14 NOTE — ED Notes (Signed)
During assessment of foley catheter pt became highly agitated and combative. MD notified. Attempt to move pt to the back to a private ED room where pt can lay down.

## 2012-01-14 NOTE — ED Provider Notes (Signed)
Medical screening examination/treatment/procedure(s) were performed by non-physician practitioner and as supervising physician I was immediately available for consultation/collaboration.   Quintarius Ferns, MD 01/14/12 2301 

## 2012-01-15 LAB — URINE CULTURE

## 2012-01-16 ENCOUNTER — Emergency Department (HOSPITAL_COMMUNITY)
Admission: EM | Admit: 2012-01-16 | Discharge: 2012-01-16 | Disposition: A | Discharge status: Home / Self Care | Payer: Medicare Other | Attending: Emergency Medicine | Admitting: Emergency Medicine

## 2012-01-16 ENCOUNTER — Encounter (HOSPITAL_COMMUNITY): Payer: Self-pay | Admitting: *Deleted

## 2012-01-16 DIAGNOSIS — F209 Schizophrenia, unspecified: Secondary | ICD-10-CM | POA: Diagnosis not present

## 2012-01-16 DIAGNOSIS — F79 Unspecified intellectual disabilities: Secondary | ICD-10-CM | POA: Diagnosis not present

## 2012-01-16 DIAGNOSIS — R310 Gross hematuria: Secondary | ICD-10-CM | POA: Diagnosis not present

## 2012-01-16 DIAGNOSIS — R195 Other fecal abnormalities: Secondary | ICD-10-CM | POA: Diagnosis not present

## 2012-01-16 DIAGNOSIS — R339 Retention of urine, unspecified: Secondary | ICD-10-CM | POA: Diagnosis not present

## 2012-01-16 DIAGNOSIS — N139 Obstructive and reflux uropathy, unspecified: Secondary | ICD-10-CM | POA: Insufficient documentation

## 2012-01-16 DIAGNOSIS — Z79899 Other long term (current) drug therapy: Secondary | ICD-10-CM | POA: Insufficient documentation

## 2012-01-16 DIAGNOSIS — R319 Hematuria, unspecified: Secondary | ICD-10-CM | POA: Diagnosis not present

## 2012-01-16 LAB — URINE MICROSCOPIC-ADD ON

## 2012-01-16 LAB — URINALYSIS, ROUTINE W REFLEX MICROSCOPIC
Nitrite: POSITIVE — AB
Specific Gravity, Urine: 1.031 — ABNORMAL HIGH (ref 1.005–1.030)
Urobilinogen, UA: 1 mg/dL (ref 0.0–1.0)

## 2012-01-16 NOTE — ED Provider Notes (Signed)
History     CSN: [id number]  Arrival date & time 01/16/12  1605   First MD Initiated Contact with Patient 01/16/12 1722      Chief Complaint  Patient presents with  . Hematuria   level V caveat for MR  (Consider location/radiation/quality/duration/timing/severity/associated sxs/prior treatment) HPI Antonio Montoya is a 61 y.o. male history of schizophrenia, mental retardation and most recently urinary retention due to obstruction, Foley catheter was placed in med center [facility] 4 days ago, he proceeded and pulling that out 2 days ago, and was re\re catheterize. He saw his urologist today. Patient presents today for blood in the catheter bag. Caregiver is concerned about blood in the bag. Patient is still taking ciprofloxacin for culture confirmed urinary tract infection.   Past Medical History  Diagnosis Date  . Schizophrenia   . MR (mental retardation)     History reviewed. No pertinent past surgical history.  History reviewed. No pertinent family history.  History  Substance Use Topics  . Smoking status: Never Smoker   . Smokeless tobacco: Not on file  . Alcohol Use: No      Review of Systems  level V caveat for MR  Allergies  Review of patient's allergies indicates no known allergies.  Home Medications   Current Outpatient Rx  Name  Route  Sig  Dispense  Refill  . BENZTROPINE MESYLATE 2 MG PO TABS   Oral   Take 2 mg by mouth 2 (two) times daily.          Marland Kitchen CIPROFLOXACIN HCL 500 MG PO TABS   Oral   Take 500 mg by mouth 2 (two) times daily. Started on 01-12-12 for 7 days pt is on day 1 of therapy         . HALOPERIDOL 5 MG PO TABS   Oral   Take 25 mg by mouth at bedtime.         Marland Kitchen LAMOTRIGINE 100 MG PO TABS   Oral   Take 100 mg by mouth 2 (two) times daily.         Marland Kitchen LAMOTRIGINE 25 MG PO TABS   Oral   Take 25-50 mg by mouth 2 (two) times daily. 1 in the morning and 2 at bedtime         . OMEPRAZOLE 20 MG PO CPDR   Oral   Take 20 mg by  mouth daily.         Marland Kitchen OXCARBAZEPINE 300 MG PO TABS   Oral   Take 300-600 mg by mouth 2 (two) times daily. 1 tablet in the morning and 2 in the evening         . CALCIUM POLYCARBOPHIL 625 MG PO TABS   Oral   Take 625 mg by mouth daily.         Marland Kitchen POTASSIUM CHLORIDE CRYS ER 10 MEQ PO TBCR   Oral   Take 10 mEq by mouth every morning.         Marland Kitchen RISPERIDONE 3 MG PO TABS   Oral   Take 3 mg by mouth 2 (two) times daily.         . TRAZODONE HCL 100 MG PO TABS   Oral   Take 100 mg by mouth at bedtime.           BP 140/68  Pulse 80  Temp 97.6 F (36.4 C) (Axillary)  Resp 18  Physical Exam  Nursing notes reviewed.  Electronic medical record reviewed. VITAL  SIGNS:   Filed Vitals:   01/16/12 1635 01/16/12 1721  BP: 140/68   Pulse: 104 80  Temp:  97.6 F (36.4 C)  TempSrc:  Axillary  Resp: 18    CONSTITUTIONAL: Awake, oriented, appears non-toxic HENT: Atraumatic, normocephalic, oral mucosa pink and moist, airway patent. Nares patent without drainage. External ears normal. EYES: Conjunctiva clear, EOMI, PERRLA NECK: Trachea midline, non-tender, supple CARDIOVASCULAR: Normal heart rate, Normal rhythm, No murmurs, rubs, gallops PULMONARY/CHEST: Clear to auscultation, no rhonchi, wheezes, or rales. Symmetrical breath sounds. Non-tender. ABDOMINAL: Non-distended, soft, non-tender - no rebound or guarding.  BS normal. GU: Uncircumcised male with Foley catheter in place. Foley bag is draining light brown urine. NEUROLOGIC: Non-focal, moving all four extremities, no gross sensory or motor deficits. EXTREMITIES: No clubbing, cyanosis, or edema SKIN: Warm, Dry, No erythema, No rash  ED Course  Procedures (including critical care time)   Labs Reviewed  URINALYSIS, ROUTINE W REFLEX MICROSCOPIC   No results found.   1. Obstructive uropathy   2. Foley catheter in place   3. Hematuria   4. MR (mental retardation)       MDM  Antonio Montoya is a 61 y.o. male  presenting with Foley bag in place for prostatic hypertrophy and obstructive uropathy. Patient has severe mental retardation history uncooperative with exam-he has been pulling at his Foley as well. I suspect that he's been dimpling his Foley causing some intermittent bleeding. There is no blood at the meatus at this time, there is no bright red blood in the Foley bag.  I told his caregiver to keep an eye on the Foley catheter drainage to make sure it is not stay persistently red the is not bleeding persistently and he is acting like himself. Patient may followup with urology as instructed. I did tell him that he may expect intermittent bleeding. She is to continue on ciprofloxacin.  I explained the diagnosis and have given explicit precautions to return to the ER including signs of illness such as fever, chills, not acting right, bright red blood that does not stop per Foley or any other new or worsening symptoms. The patient understands and accepts the medical plan as it's been dictated and I have answered their questions. Discharge instructions concerning home care and prescriptions have been given.  The patient is STABLE and is discharged to home in good condition.          Jones Skene, MD 01/16/12 2007

## 2012-01-16 NOTE — ED Notes (Signed)
Pt presents with caregiver. Pt has MR, has blood in foley. Caregiver noticed blood around 1pm today.

## 2012-01-16 NOTE — ED Notes (Signed)
Pt refused VS  

## 2012-01-16 NOTE — Progress Notes (Signed)
caregiver states pcp is in [place] [address] Myrene Galas & urologist mark ottelin Caregiver provided urology information These forms provided to EDP, [place]

## 2012-01-16 NOTE — ED Notes (Addendum)
Caregiver states that he does not drink adequate amounts of fluids. The redness in urine started yesterday doctor saw pt this morning but urine has gotten redder since then. This is his normal behavior and he has seen no change in that.

## 2012-01-16 NOTE — ED Notes (Signed)
Pt w/ severe MR, unable to sit without gross motor movement for d/c VS to be done. Pt alert and active, in no acute distress. D/c instructions reviewed w/ caregiver at bedside, caregiver denies any further questions or concerns at present.

## 2012-01-16 NOTE — ED Notes (Signed)
+  Urine. Patient treated with Cipro. Sensitive to same. Per protocol MD. °

## 2012-01-17 ENCOUNTER — Emergency Department (HOSPITAL_COMMUNITY)
Admission: EM | Admit: 2012-01-17 | Discharge: 2012-01-17 | Disposition: A | Discharge status: Home / Self Care | Payer: Medicare Other | Attending: Emergency Medicine | Admitting: Emergency Medicine

## 2012-01-17 ENCOUNTER — Encounter (HOSPITAL_COMMUNITY): Payer: Self-pay | Admitting: Emergency Medicine

## 2012-01-17 DIAGNOSIS — R195 Other fecal abnormalities: Secondary | ICD-10-CM

## 2012-01-17 DIAGNOSIS — F209 Schizophrenia, unspecified: Secondary | ICD-10-CM | POA: Insufficient documentation

## 2012-01-17 DIAGNOSIS — K921 Melena: Secondary | ICD-10-CM | POA: Insufficient documentation

## 2012-01-17 DIAGNOSIS — F79 Unspecified intellectual disabilities: Secondary | ICD-10-CM | POA: Insufficient documentation

## 2012-01-17 DIAGNOSIS — R58 Hemorrhage, not elsewhere classified: Secondary | ICD-10-CM | POA: Diagnosis not present

## 2012-01-17 DIAGNOSIS — R319 Hematuria, unspecified: Secondary | ICD-10-CM | POA: Diagnosis not present

## 2012-01-17 LAB — CBC WITH DIFFERENTIAL/PLATELET
Basophils Absolute: 0.1 10*3/uL (ref 0.0–0.1)
Eosinophils Absolute: 0.2 10*3/uL (ref 0.0–0.7)
Hemoglobin: 12 g/dL — ABNORMAL LOW (ref 13.0–17.0)
Lymphocytes Relative: 18 % (ref 12–46)
MCHC: 35.3 g/dL (ref 30.0–36.0)
Neutrophils Relative %: 72 % (ref 43–77)
Platelets: 176 10*3/uL (ref 150–400)
RDW: 11.7 % (ref 11.5–15.5)

## 2012-01-17 NOTE — ED Notes (Addendum)
Pt is highly agitated. Unable to get VS. Pt's caregiver at bedside for safety.

## 2012-01-17 NOTE — ED Provider Notes (Signed)
History     CSN: [id number]  Arrival date & time 01/17/12  0111   First MD Initiated Contact with Patient 01/17/12 0129      Chief Complaint  Patient presents with  . Hematuria    (Consider location/radiation/quality/duration/timing/severity/associated sxs/prior treatment) HPI Comments: 61 year old male with severe mental retardation who presents with recurrent hematuria. The caregiver who is with him at home states that he had to have a bowel movement, on the toilet he had some blood coming from around the Foley catheter. There was some blood in the toilet, this is now resolved. The patient is at his baseline with agitation and this is normal for him. According to the caregiver the patient was started on occasion for his prostate today at the urologist office in hopes of being able to take a Foley catheter out in several days. The patient has a leg bag.  Patient is a 61 y.o. male presenting with hematuria. The history is provided by the patient. History Limited By: Level V caveat applied secondary to severe mental retardation.  Hematuria    Past Medical History  Diagnosis Date  . Schizophrenia   . MR (mental retardation)     History reviewed. No pertinent past surgical history.  No family history on file.  History  Substance Use Topics  . Smoking status: Never Smoker   . Smokeless tobacco: Not on file  . Alcohol Use: No      Review of Systems  Unable to perform ROS: Psychiatric disorder  Genitourinary: Positive for hematuria.    Allergies  Review of patient's allergies indicates no known allergies.  Home Medications   Current Outpatient Rx  Name  Route  Sig  Dispense  Refill  . BENZTROPINE MESYLATE 2 MG PO TABS   Oral   Take 2 mg by mouth 2 (two) times daily.          Marland Kitchen CIPROFLOXACIN HCL 500 MG PO TABS   Oral   Take 500 mg by mouth 2 (two) times daily. Started on 01-12-12 for 7 days pt is on day 1 of therapy         . HALOPERIDOL 5 MG PO TABS   Oral   Take 25 mg by mouth at bedtime.         Marland Kitchen LAMOTRIGINE 100 MG PO TABS   Oral   Take 100 mg by mouth 2 (two) times daily.         Marland Kitchen LAMOTRIGINE 25 MG PO TABS   Oral   Take 25-50 mg by mouth 2 (two) times daily. 1 in the morning and 2 at bedtime         . OMEPRAZOLE 20 MG PO CPDR   Oral   Take 20 mg by mouth daily.         Marland Kitchen OXCARBAZEPINE 300 MG PO TABS   Oral   Take 300-600 mg by mouth 2 (two) times daily. 1 tablet in the morning and 2 in the evening         . CALCIUM POLYCARBOPHIL 625 MG PO TABS   Oral   Take 625 mg by mouth daily.         Marland Kitchen POTASSIUM CHLORIDE CRYS ER 10 MEQ PO TBCR   Oral   Take 10 mEq by mouth every morning.         Marland Kitchen RISPERIDONE 3 MG PO TABS   Oral   Take 3 mg by mouth 2 (two) times daily.         Marland Kitchen  TRAZODONE HCL 100 MG PO TABS   Oral   Take 100 mg by mouth at bedtime.           There were no vitals taken for this visit.  Physical Exam  Nursing note and vitals reviewed. Constitutional: He appears well-developed and well-nourished.  HENT:  Head: Normocephalic and atraumatic.  Mouth/Throat: Oropharynx is clear and moist. No oropharyngeal exudate.  Eyes: Conjunctivae normal and EOM are normal. Pupils are equal, round, and reactive to light. Right eye exhibits no discharge. Left eye exhibits no discharge. No scleral icterus.  Neck: Normal range of motion. Neck supple. No JVD present. No thyromegaly present.  Cardiovascular: Normal rate, regular rhythm, normal heart sounds and intact distal pulses.  Exam reveals no gallop and no friction rub.   No murmur heard. Pulmonary/Chest: Effort normal and breath sounds normal. No respiratory distress. He has no wheezes. He has no rales.  Abdominal: Soft. Bowel sounds are normal. He exhibits no distension and no mass. There is no tenderness.  Genitourinary:       Uncircumcised penis, Foley catheter in place, clear urine in the Foley bag. No blood at the urethral meatus. Rectal exam performed  with chaperone present, no stool in the rectal vault, no blood, no fissures.  Musculoskeletal: Normal range of motion. He exhibits no edema and no tenderness.  Lymphadenopathy:    He has no cervical adenopathy.  Neurological: He is alert. Coordination normal.  Skin: Skin is warm and dry. No rash noted. No erythema.  Psychiatric: He has a normal mood and affect. His behavior is normal.    ED Course  Procedures (including critical care time)  Labs Reviewed  CBC WITH DIFFERENTIAL - Abnormal; Notable for the following:    RBC 3.65 (*)     Hemoglobin 12.0 (*)     HCT 34.0 (*)     All other components within normal limits   No results found.   1. Occult blood positive stool       MDM  The patient is at his baseline mental status and agitation according to the caregiver. I have performed a Hemoccult on the rectal exam material which was a small amount of mucus in the rectal vault which was faint positive. He has no evidence of hematuria or blood at urethral meatus or trauma to the urethra. Urine is flowing without difficulty into the Foley catheter bag. Will recheck a CBC and compared to the labs drawn several days ago which at that time had an essentially normal hemoglobin. Otherwise patient still appears stable and can followup as outpatient    Hemoglobin level similar to prior, no indication for further workup tonight, referred back to primary care Dr. for repeat fecal occult testing, patient appears stable for discharge.     Vida Roller, MD 01/17/12 [phone]

## 2012-01-17 NOTE — ED Notes (Signed)
[id number]<AV> Expected date:01/17/12<BR> Expected time:12:48 AM<BR> Means of arrival:Ambulance<BR> Comments:<BR> Catheter problems; severe MR

## 2012-01-17 NOTE — ED Notes (Signed)
Per EMS, pt has hx of severe MR and lives in a group home with a full-time caregiver. Pt was seen in [facility] on 11/1 and 11/6 for UTI and hematuria. Pt's caregiver called EMS concerned that there appeared to be more blood in the urine and was concerned that the pt may have pulled his foley causing damage. Pt is highly agitated, kicking and pushing and unable to follow commands. Pt has tried to leave his room twice and was redirected. Sitter at bedside for safety

## 2012-01-18 LAB — URINE CULTURE

## 2012-01-19 NOTE — ED Notes (Signed)
+   Urine Chart sent to EDP office for review. 

## 2012-01-20 ENCOUNTER — Encounter (HOSPITAL_COMMUNITY): Payer: Self-pay | Admitting: Emergency Medicine

## 2012-01-20 ENCOUNTER — Emergency Department (HOSPITAL_COMMUNITY)
Admission: EM | Admit: 2012-01-20 | Discharge: 2012-01-20 | Disposition: A | Discharge status: Home / Self Care | Payer: Medicare Other | Attending: Emergency Medicine | Admitting: Emergency Medicine

## 2012-01-20 DIAGNOSIS — T83091A Other mechanical complication of indwelling urethral catheter, initial encounter: Secondary | ICD-10-CM | POA: Insufficient documentation

## 2012-01-20 DIAGNOSIS — T839XXA Unspecified complication of genitourinary prosthetic device, implant and graft, initial encounter: Secondary | ICD-10-CM

## 2012-01-20 DIAGNOSIS — Y849 Medical procedure, unspecified as the cause of abnormal reaction of the patient, or of later complication, without mention of misadventure at the time of the procedure: Secondary | ICD-10-CM | POA: Insufficient documentation

## 2012-01-20 DIAGNOSIS — T8389XA Other specified complication of genitourinary prosthetic devices, implants and grafts, initial encounter: Secondary | ICD-10-CM | POA: Diagnosis not present

## 2012-01-20 DIAGNOSIS — F209 Schizophrenia, unspecified: Secondary | ICD-10-CM | POA: Diagnosis not present

## 2012-01-20 DIAGNOSIS — Z79899 Other long term (current) drug therapy: Secondary | ICD-10-CM | POA: Diagnosis not present

## 2012-01-20 DIAGNOSIS — F79 Unspecified intellectual disabilities: Secondary | ICD-10-CM | POA: Diagnosis not present

## 2012-01-20 NOTE — ED Notes (Signed)
Pt will not allow vital signs to be taken

## 2012-01-20 NOTE — ED Provider Notes (Signed)
History     CSN: [id number]  Arrival date & time 01/20/12  1631   First MD Initiated Contact with Patient 01/20/12 1730      Chief Complaint  Patient presents with  . Urinary Retention    (Consider location/radiation/quality/duration/timing/severity/associated sxs/prior treatment) HPI Comments: [age] y.o. 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 Patient has recent history of urinary retention.  He has seen a urologist who started him on flomax.  Patient was to continue using the foley cath and leg bag for 7 days. Today is day 5/7.  His cartaker noticed that the leg bag had disconnected from the catheter and brought him in to have it reconnected. Patient has been making urine normally.  The caretaker denies that the patient has been running fevers or acting ill.  He has not noticed a foul odor of urine.      The history is provided by a caregiver. No language interpreter was used.    Past Medical History  Diagnosis Date  . Schizophrenia   . MR (mental retardation)     History reviewed. No pertinent past surgical history.  History reviewed. No pertinent family history.  History  Substance Use Topics  . Smoking status: Never Smoker   . Smokeless tobacco: Not on file  . Alcohol Use: No      Review of Systems  Unable to perform ROS   Allergies  Review of patient's allergies indicates no known allergies.  Home Medications   Current Outpatient Rx  Name  Route  Sig  Dispense  Refill  . BENZTROPINE MESYLATE 2 MG PO TABS   Oral   Take 2 mg by mouth 2 (two) times daily.          Marland Kitchen CIPROFLOXACIN HCL 500 MG PO TABS   Oral   Take 500 mg by mouth 2 (two) times daily. Started on 01-12-12 for 7 days pt is on day 1 of therapy         . HALOPERIDOL 5 MG PO TABS   Oral   Take 25 mg by mouth at bedtime.         Marland Kitchen LAMOTRIGINE 100 MG PO TABS  Oral   Take 100 mg by mouth 2 (two) times daily.         Marland Kitchen LAMOTRIGINE 25 MG PO TABS   Oral   Take 25-50 mg by mouth 2 (two) times daily. 1 in the morning and 2 at bedtime         . OMEPRAZOLE 20 MG PO CPDR   Oral   Take 20 mg by mouth daily.         Marland Kitchen OXCARBAZEPINE 300 MG PO TABS   Oral   Take 300-600 mg by mouth 2 (two) times daily. 1 tablet in the morning and 2 in the evening         . CALCIUM POLYCARBOPHIL 625 MG PO TABS   Oral   Take 625 mg by mouth daily.         Marland Kitchen POTASSIUM CHLORIDE CRYS ER 10 MEQ PO TBCR   Oral   Take 10 mEq by mouth every morning.         Marland Kitchen RISPERIDONE 3 MG PO TABS   Oral   Take 3 mg by mouth 2 (two) times daily.         Marland Kitchen  TAMSULOSIN HCL 0.4 MG PO CAPS   Oral   Take 0.4 mg by mouth daily after supper.         . TRAZODONE HCL 100 MG PO TABS   Oral   Take 100 mg by mouth at bedtime.           There were no vitals taken for this visit.  Physical Exam  Nursing note and vitals reviewed. Constitutional: No distress.       Patient with profound MR.  He is edentulous and appears to have tardive dyskinesia.   He does not communicate and appears agitated.  He tries to run out of the room several times and moves around the room frantically. He will not allow nursing staff to touch him.  HENT:  Head: Atraumatic.       Microcephalic   Eyes: Conjunctivae normal are normal.  Cardiovascular: Normal rate, regular rhythm and normal heart sounds.   Abdominal: Soft. Bowel sounds are normal. He exhibits no distension.  Genitourinary: Penis normal.       Foley catheter is in place.  Neurological: He is alert.  Skin: Skin is warm. He is not diaphoretic.  Psychiatric: He is agitated, aggressive, is hyperactive and combative. Cognition and memory are impaired. He is noncommunicative.    ED Course  Procedures (including critical care time)  Labs Reviewed - No data to display No results found.   1. Foley catheter problem       MDM    The patient's leg bag was successfully reattached.  He does not appear ill.  VSS.  He may be discharged.  Caregiver will follow instructions given by the urologist.  Patient may return if urinary retention persists or developes infection. FU with urology. BP 150/81  Pulse 88  Temp 97.8 F (36.6 C)  Resp 18         Arthor Captain, PA-C 01/21/12 1423

## 2012-01-20 NOTE — ED Notes (Addendum)
Pt pushing me away when trying to obtain vital signs and will not hold still. He is not following directions. Keeps taking off blood pressure cuff. He jumped off the bed and ran towards the wall.

## 2012-01-20 NOTE — ED Notes (Signed)
Patient has MR and the patient possibley pulled out cather and needs it replaced - leg bag is off

## 2012-01-20 NOTE — ED Provider Notes (Signed)
6:15 PM patient alert no distress after treatment in the emergency department. Sitting in bed resting comfortably  Doug Sou, MD 01/20/12 1820

## 2012-01-20 NOTE — ED Notes (Signed)
Chart returned from EDP office. Per [facility] PA-C, patient to continue current antibiotic regimen. Follow-up with urology as instructed. Return for worsening symptoms.

## 2012-01-21 DIAGNOSIS — K625 Hemorrhage of anus and rectum: Secondary | ICD-10-CM | POA: Diagnosis not present

## 2012-01-21 DIAGNOSIS — R319 Hematuria, unspecified: Secondary | ICD-10-CM | POA: Diagnosis not present

## 2012-01-21 DIAGNOSIS — F72 Severe intellectual disabilities: Secondary | ICD-10-CM | POA: Diagnosis not present

## 2012-01-22 NOTE — ED Provider Notes (Signed)
Medical screening examination/treatment/procedure(s) were conducted as a shared visit with non-physician practitioner(s) and myself.  I personally evaluated the patient during the encounter  Doug Sou, MD 01/22/12 [phone]

## 2012-01-23 ENCOUNTER — Encounter (HOSPITAL_COMMUNITY): Payer: Self-pay | Admitting: Emergency Medicine

## 2012-01-23 ENCOUNTER — Emergency Department (HOSPITAL_COMMUNITY)
Admission: EM | Admit: 2012-01-23 | Discharge: 2012-01-23 | Disposition: A | Discharge status: Home / Self Care | Payer: Medicare Other | Attending: Emergency Medicine | Admitting: Emergency Medicine

## 2012-01-23 DIAGNOSIS — Z79899 Other long term (current) drug therapy: Secondary | ICD-10-CM | POA: Diagnosis not present

## 2012-01-23 DIAGNOSIS — F209 Schizophrenia, unspecified: Secondary | ICD-10-CM | POA: Diagnosis not present

## 2012-01-23 DIAGNOSIS — Z466 Encounter for fitting and adjustment of urinary device: Secondary | ICD-10-CM | POA: Insufficient documentation

## 2012-01-23 DIAGNOSIS — F79 Unspecified intellectual disabilities: Secondary | ICD-10-CM | POA: Diagnosis not present

## 2012-01-23 DIAGNOSIS — T83091A Other mechanical complication of indwelling urethral catheter, initial encounter: Secondary | ICD-10-CM | POA: Diagnosis not present

## 2012-01-23 NOTE — ED Notes (Addendum)
Pt was seen at [facility] urology and was told that he could have his catheter removed.  Was sent here to have it removed.  Pt is MR and will not allow any vitals to be taken.  Pt is "aggressively loving".  When this writer gets near the pt, he reaches out, grabs my arm, and rubs his head all over my side.  Pt rips BP cuff off, refuses to let me put on pulse ox.

## 2012-01-23 NOTE — ED Provider Notes (Signed)
History     CSN: [id number]  Arrival date & time 01/23/12  1121   First MD Initiated Contact with Patient 01/23/12 1133      Chief Complaint  Patient presents with  . Foley Catheter Removal    Level 5 caveat due to mental retardation  (Consider location/radiation/quality/duration/timing/severity/associated sxs/prior treatment) The history is provided by the patient.  patient has a history of schizophrenia and mental retardation. He was seen in the ER a few days ago and had a Foley catheter placed for urinary obstruction. He comes back in for removal. Caregivers with the patient states that they talk to his urologist is told that they could remove it at his group home. The mother did not feel comfortable doing that and came in to have it removed here. he is on Flomax  Past Medical History  Diagnosis Date  . Schizophrenia   . MR (mental retardation)     No past surgical history on file.  No family history on file.  History  Substance Use Topics  . Smoking status: Never Smoker   . Smokeless tobacco: Not on file  . Alcohol Use: No      Review of Systems  Unable to perform ROS: Psychiatric disorder    Allergies  Review of patient's allergies indicates no known allergies.  Home Medications   Current Outpatient Rx  Name  Route  Sig  Dispense  Refill  . BENZTROPINE MESYLATE 2 MG PO TABS   Oral   Take 2 mg by mouth 2 (two) times daily.          Marland Kitchen CIPROFLOXACIN HCL 500 MG PO TABS   Oral   Take 500 mg by mouth 2 (two) times daily. 7 day course completed Friday         . HALOPERIDOL 5 MG PO TABS   Oral   Take 25 mg by mouth at bedtime.         Marland Kitchen LAMOTRIGINE 100 MG PO TABS   Oral   Take 100 mg by mouth 2 (two) times daily. 125mg  (100mg  tab and 25mg  tab) in the morning and 150mg  (100mg  tab and 2x 25mg  tab) in the evening         . LAMOTRIGINE 25 MG PO TABS   Oral   Take 25-50 mg by mouth 2 (two) times daily. 125mg  (100mg  tab and 25mg  tab) in the morning  and 150mg  (100mg  tab and 2x 25mg  tab) in the evening         . OMEPRAZOLE 20 MG PO CPDR   Oral   Take 20 mg by mouth every morning.          Marland Kitchen OXCARBAZEPINE 300 MG PO TABS   Oral   Take 300-600 mg by mouth 2 (two) times daily. 1 tablet in the morning and 2 in the evening         . CALCIUM POLYCARBOPHIL 625 MG PO TABS   Oral   Take 625 mg by mouth every morning.          Marland Kitchen POTASSIUM CHLORIDE CRYS ER 10 MEQ PO TBCR   Oral   Take 10 mEq by mouth every morning.         Marland Kitchen RISPERIDONE 3 MG PO TABS   Oral   Take 3 mg by mouth 2 (two) times daily.         Marland Kitchen TAMSULOSIN HCL 0.4 MG PO CAPS   Oral   Take 0.8 mg by mouth daily after  supper.          . TRAZODONE HCL 100 MG PO TABS   Oral   Take 100 mg by mouth at bedtime.           There were no vitals taken for this visit.  Physical Exam  Constitutional: He appears well-developed.       Patient would not allow vitals to be taken he is well-appearing  HENT:  Head: Normocephalic.  Cardiovascular: Normal rate.   Pulmonary/Chest: Effort normal.  Abdominal: There is no tenderness.  Genitourinary:       Foley catheter in place  Neurological: He is alert.       Patient is at his reported baseline  Skin: Skin is warm.    ED Course  Procedures (including critical care time)  Labs Reviewed - No data to display No results found.   1. Encounter for Foley catheter removal       MDM  Patient has some mental retardation and his baseline nonverbal. He came in for removal of his Foley catheter that was placed a few days ago. Foley catheters been removed. He has urinated on his own. He will followup with his urologist as needed.        Juliet Rude. Rubin Payor, MD 01/23/12 1430

## 2012-01-23 NOTE — ED Notes (Signed)
Unable to obtain pt's vitals due to combativeness and aggression from pt.  Dr pickering aware.  Pt was placed in soft restraints for the duration of the removal of his foley catheter.  Foley catheter and restraints removed.  Pt is alert and oriented and in no distress. Pt is calm.

## 2012-01-28 DIAGNOSIS — F259 Schizoaffective disorder, unspecified: Secondary | ICD-10-CM | POA: Diagnosis not present

## 2012-04-29 DIAGNOSIS — F259 Schizoaffective disorder, unspecified: Secondary | ICD-10-CM | POA: Diagnosis not present

## 2012-07-21 DIAGNOSIS — F259 Schizoaffective disorder, unspecified: Secondary | ICD-10-CM | POA: Diagnosis not present

## 2012-12-08 DIAGNOSIS — F259 Schizoaffective disorder, unspecified: Secondary | ICD-10-CM | POA: Diagnosis not present

## 2013-03-09 DIAGNOSIS — Z23 Encounter for immunization: Secondary | ICD-10-CM | POA: Diagnosis not present

## 2013-03-09 DIAGNOSIS — F259 Schizoaffective disorder, unspecified: Secondary | ICD-10-CM | POA: Diagnosis not present

## 2013-06-08 DIAGNOSIS — Z0000 Encounter for general adult medical examination without abnormal findings: Secondary | ICD-10-CM | POA: Diagnosis not present

## 2013-06-08 DIAGNOSIS — F259 Schizoaffective disorder, unspecified: Secondary | ICD-10-CM | POA: Diagnosis not present

## 2013-09-07 DIAGNOSIS — F259 Schizoaffective disorder, unspecified: Secondary | ICD-10-CM | POA: Diagnosis not present

## 2013-12-08 DIAGNOSIS — F259 Schizoaffective disorder, unspecified: Secondary | ICD-10-CM | POA: Diagnosis not present

## 2014-03-01 DIAGNOSIS — F250 Schizoaffective disorder, bipolar type: Secondary | ICD-10-CM | POA: Diagnosis not present

## 2014-06-07 DIAGNOSIS — F250 Schizoaffective disorder, bipolar type: Secondary | ICD-10-CM | POA: Diagnosis not present

## 2014-09-06 DIAGNOSIS — F250 Schizoaffective disorder, bipolar type: Secondary | ICD-10-CM | POA: Diagnosis not present

## 2014-11-17 DIAGNOSIS — Z0000 Encounter for general adult medical examination without abnormal findings: Secondary | ICD-10-CM | POA: Diagnosis not present

## 2014-11-17 DIAGNOSIS — Z6826 Body mass index (BMI) 26.0-26.9, adult: Secondary | ICD-10-CM | POA: Diagnosis not present

## 2014-12-07 DIAGNOSIS — F250 Schizoaffective disorder, bipolar type: Secondary | ICD-10-CM | POA: Diagnosis not present

## 2015-03-01 DIAGNOSIS — F250 Schizoaffective disorder, bipolar type: Secondary | ICD-10-CM | POA: Diagnosis not present

## 2015-05-31 DIAGNOSIS — F72 Severe intellectual disabilities: Secondary | ICD-10-CM | POA: Diagnosis not present

## 2015-05-31 DIAGNOSIS — R4589 Other symptoms and signs involving emotional state: Secondary | ICD-10-CM | POA: Diagnosis not present

## 2015-05-31 DIAGNOSIS — J019 Acute sinusitis, unspecified: Secondary | ICD-10-CM | POA: Diagnosis not present

## 2015-06-21 DIAGNOSIS — F250 Schizoaffective disorder, bipolar type: Secondary | ICD-10-CM | POA: Diagnosis not present

## 2015-08-12 DIAGNOSIS — R2232 Localized swelling, mass and lump, left upper limb: Secondary | ICD-10-CM | POA: Diagnosis not present

## 2015-08-22 DIAGNOSIS — F250 Schizoaffective disorder, bipolar type: Secondary | ICD-10-CM | POA: Diagnosis not present

## 2015-11-22 DIAGNOSIS — Z1389 Encounter for screening for other disorder: Secondary | ICD-10-CM | POA: Diagnosis not present

## 2015-11-22 DIAGNOSIS — F250 Schizoaffective disorder, bipolar type: Secondary | ICD-10-CM | POA: Diagnosis not present

## 2015-11-22 DIAGNOSIS — Z79899 Other long term (current) drug therapy: Secondary | ICD-10-CM | POA: Diagnosis not present

## 2015-11-22 DIAGNOSIS — F72 Severe intellectual disabilities: Secondary | ICD-10-CM | POA: Diagnosis not present

## 2015-11-22 DIAGNOSIS — I10 Essential (primary) hypertension: Secondary | ICD-10-CM | POA: Diagnosis not present

## 2015-11-22 DIAGNOSIS — R4589 Other symptoms and signs involving emotional state: Secondary | ICD-10-CM | POA: Diagnosis not present

## 2015-11-22 DIAGNOSIS — Z139 Encounter for screening, unspecified: Secondary | ICD-10-CM | POA: Diagnosis not present

## 2016-02-09 DIAGNOSIS — N400 Enlarged prostate without lower urinary tract symptoms: Secondary | ICD-10-CM | POA: Diagnosis not present

## 2016-03-08 DIAGNOSIS — S0083XA Contusion of other part of head, initial encounter: Secondary | ICD-10-CM | POA: Diagnosis not present

## 2016-04-01 ENCOUNTER — Emergency Department (HOSPITAL_BASED_OUTPATIENT_CLINIC_OR_DEPARTMENT_OTHER): Payer: Medicare Other

## 2016-04-01 ENCOUNTER — Encounter (HOSPITAL_BASED_OUTPATIENT_CLINIC_OR_DEPARTMENT_OTHER): Payer: Self-pay | Admitting: Emergency Medicine

## 2016-04-01 ENCOUNTER — Emergency Department (HOSPITAL_BASED_OUTPATIENT_CLINIC_OR_DEPARTMENT_OTHER)
Admission: EM | Admit: 2016-04-01 | Discharge: 2016-04-01 | Disposition: A | Discharge status: Home / Self Care | Payer: Medicare Other | Attending: Emergency Medicine | Admitting: Emergency Medicine

## 2016-04-01 DIAGNOSIS — Y999 Unspecified external cause status: Secondary | ICD-10-CM | POA: Diagnosis not present

## 2016-04-01 DIAGNOSIS — Y939 Activity, unspecified: Secondary | ICD-10-CM | POA: Insufficient documentation

## 2016-04-01 DIAGNOSIS — S0990XA Unspecified injury of head, initial encounter: Secondary | ICD-10-CM | POA: Diagnosis not present

## 2016-04-01 DIAGNOSIS — R7989 Other specified abnormal findings of blood chemistry: Secondary | ICD-10-CM

## 2016-04-01 DIAGNOSIS — W1809XA Striking against other object with subsequent fall, initial encounter: Secondary | ICD-10-CM | POA: Diagnosis not present

## 2016-04-01 DIAGNOSIS — F79 Unspecified intellectual disabilities: Secondary | ICD-10-CM | POA: Insufficient documentation

## 2016-04-01 DIAGNOSIS — Y929 Unspecified place or not applicable: Secondary | ICD-10-CM | POA: Diagnosis not present

## 2016-04-01 DIAGNOSIS — W19XXXA Unspecified fall, initial encounter: Secondary | ICD-10-CM

## 2016-04-01 LAB — COMPREHENSIVE METABOLIC PANEL
ALK PHOS: 112 U/L (ref 38–126)
ALT: 64 U/L — AB (ref 17–63)
ANION GAP: 5 (ref 5–15)
AST: 73 U/L — ABNORMAL HIGH (ref 15–41)
Albumin: 4.4 g/dL (ref 3.5–5.0)
BILIRUBIN TOTAL: 0.4 mg/dL (ref 0.3–1.2)
BUN: 32 mg/dL — ABNORMAL HIGH (ref 6–20)
CALCIUM: 10 mg/dL (ref 8.9–10.3)
CO2: 32 mmol/L (ref 22–32)
CREATININE: 1.41 mg/dL — AB (ref 0.61–1.24)
Chloride: 101 mmol/L (ref 101–111)
GFR, EST AFRICAN AMERICAN: 59 mL/min — AB (ref >60)
GFR, EST NON AFRICAN AMERICAN: 51 mL/min — AB (ref >60)
Glucose, Bld: 135 mg/dL — ABNORMAL HIGH (ref 65–99)
Potassium: 4.8 mmol/L (ref 3.5–5.1)
Sodium: 138 mmol/L (ref 135–145)
TOTAL PROTEIN: 7.6 g/dL (ref 6.5–8.1)

## 2016-04-01 LAB — CBC WITH DIFFERENTIAL/PLATELET
BASOS ABS: 0 10*3/uL (ref 0.0–0.1)
BASOS PCT: 0 %
Eosinophils Absolute: 0 10*3/uL (ref 0.0–0.7)
Eosinophils Relative: 0 %
HCT: 38.6 % — ABNORMAL LOW (ref 39.0–52.0)
HEMOGLOBIN: 13.6 g/dL (ref 13.0–17.0)
Lymphocytes Relative: 14 %
Lymphs Abs: 1 10*3/uL (ref 0.7–4.0)
MCH: 32.9 pg (ref 26.0–34.0)
MCHC: 35.2 g/dL (ref 30.0–36.0)
MCV: 93.2 fL (ref 78.0–100.0)
MONOS PCT: 5 %
Monocytes Absolute: 0.3 10*3/uL (ref 0.1–1.0)
NEUTROS ABS: 6 10*3/uL (ref 1.7–7.7)
NEUTROS PCT: 81 %
Platelets: 173 10*3/uL (ref 150–400)
RBC: 4.14 MIL/uL — ABNORMAL LOW (ref 4.22–5.81)
RDW: 11.5 % (ref 11.5–15.5)
WBC: 7.4 10*3/uL (ref 4.0–10.5)

## 2016-04-01 LAB — ETHANOL: Alcohol, Ethyl (B): <5 mg/dL (ref <5)

## 2016-04-01 MED ORDER — HALOPERIDOL LACTATE 5 MG/ML IJ SOLN
5.0000 mg | Freq: Once | INTRAMUSCULAR | Status: AC
  Administered 2016-04-01: 5 mg via INTRAMUSCULAR
  Filled 2016-04-01: qty 1

## 2016-04-01 MED ORDER — ZIPRASIDONE MESYLATE 20 MG IM SOLR
10.0000 mg | Freq: Once | INTRAMUSCULAR | Status: AC
  Administered 2016-04-01: 10 mg via INTRAMUSCULAR
  Filled 2016-04-01: qty 20

## 2016-04-01 NOTE — ED Notes (Signed)
Personal group home staff offering pt food/drink.

## 2016-04-01 NOTE — ED Notes (Signed)
No sedation noted, 4 person assist for blood draw d/t resistance. No changes.

## 2016-04-01 NOTE — ED Provider Notes (Addendum)
[facility] Provider Note   CSN: [id number] Arrival date & time: 04/01/16  1705  By signing my name below, I, [name], attest that this documentation has been prepared under the direction and in the presence of Rolan BuccoMelanie Chip Canepa, MD. Electronically Signed: [name], ED Scribe. 04/01/16. 5:51 PM.  History   Chief Complaint Chief Complaint  Patient presents with  . Fall   LEVEL V CAVEAT: HPI and ROS limited due to mental retardation  The history is provided by a caregiver. History limited by: mental retardation.    HPI Comments: Antonio Montoya is a 66 y.o. male with a h/o MR, DM (unmedicated) and Schizophrenia who presents to the Emergency Department brought in by caregiver because he had an episode this am where he stumbled and bumped his head on a door.  No LOC. Caretaker states that he hasn't noticed any more stumbling since that episode. He's been acting at baseline. He is agitated in the ED but the caretaker states he's always agitated when he is in healthcare centers. Per caregiver, he has not been sleeping well over the past several days and he has had increased bilateral ankle swelling as well.  No recent change in medications. He has not recently been c/o pain, per caregiver. Caregiver denies vomiting, diarrhea, cough, or any other associated symptoms.   Past Medical History:  Diagnosis Date  . MR (mental retardation)   . Schizophrenia (HCC)    There are no active problems to display for this patient.  History reviewed. No pertinent surgical history.  Home Medications    Prior to Admission medications   Medication Sig Start Date End Date Taking? Authorizing Provider  benztropine (COGENTIN) 2 MG tablet Take 2 mg by mouth 2 (two) times daily.    Yes Historical Provider, MD  haloperidol (HALDOL) 5 MG tablet Take 25 mg by mouth at bedtime.   Yes Historical Provider, MD  lamoTRIgine (LAMICTAL) 100 MG tablet Take 100 mg by mouth 2 (two) times  daily. 125mg  (100mg  tab and 25mg  tab) in the morning and 150mg  (100mg  tab and 2x 25mg  tab) in the evening   Yes Historical Provider, MD  lamoTRIgine (LAMICTAL) 25 MG tablet Take 25-50 mg by mouth 2 (two) times daily. 125mg  (100mg  tab and 25mg  tab) in the morning and 150mg  (100mg  tab and 2x 25mg  tab) in the evening   Yes Historical Provider, MD  omeprazole (PRILOSEC) 20 MG capsule Take 20 mg by mouth every morning.    Yes Historical Provider, MD  Oxcarbazepine (TRILEPTAL) 300 MG tablet Take 300-600 mg by mouth 2 (two) times daily. 1 tablet in the morning and 2 in the evening   Yes Historical Provider, MD  potassium chloride (K-DUR,KLOR-CON) 10 MEQ tablet Take 10 mEq by mouth every morning.   Yes Historical Provider, MD  risperiDONE (RISPERDAL) 3 MG tablet Take 3 mg by mouth 2 (two) times daily.   Yes Historical Provider, MD  traZODone (DESYREL) 100 MG tablet Take 100 mg by mouth at bedtime.   Yes Historical Provider, MD  ciprofloxacin (CIPRO) 500 MG tablet Take 500 mg by mouth 2 (two) times daily. 7 day course completed Friday 01/12/12   April Palumbo, MD  polycarbophil (FIBERCON) 625 MG tablet Take 625 mg by mouth every morning.     Historical Provider, MD  Tamsulosin HCl (FLOMAX) 0.4 MG CAPS Take 0.8 mg by mouth daily after supper.     Historical Provider, MD   Family History No family history on file.  Social History Social History  Substance Use Topics  . Smoking status: Never Smoker  . Smokeless tobacco: Never Used  . Alcohol use No   Allergies   Patient has no known allergies.  Review of Systems Review of Systems  Unable to perform ROS: Other (mental retardation)   Physical Exam Updated Vital Signs BP 115/70 (BP Location: Right Arm)   Physical Exam  Constitutional: He appears well-developed and well-nourished.  HENT:  Head: Normocephalic and atraumatic.  Eyes: Pupils are equal, round, and reactive to light.  Neck: Normal range of motion. Neck supple.  No pain to the cervical  thoracic or lumbosacral spine  Cardiovascular: Normal rate, regular rhythm and normal heart sounds.   Pulmonary/Chest: Effort normal and breath sounds normal. No respiratory distress. He has no wheezes. He has no rales. He exhibits no tenderness.  Abdominal: Soft. Bowel sounds are normal. There is no tenderness. There is no rebound and no guarding.  Musculoskeletal: Normal range of motion. He exhibits edema (Trace edema to the feet and ankles bilaterally. No erythema or warmth. No pain on palpation of the lower extremities.).  Lymphadenopathy:    He has no cervical adenopathy.  Neurological: He is alert.  Patient is nonverbal at baseline. He is very agitated and is wandering around the room. He seems to be ambulating normally. He's moving all extremities symmetrically. He has good grip strength bilaterally.  Skin: Skin is warm and dry. No rash noted.  Psychiatric: He has a normal mood and affect.   ED Treatments / Results  COORDINATION OF CARE: 5:50 PM-Discussed next steps with caregiver including CT Head, UA, and blood work. Caregiver verbalized understanding and is agreeable with the plan.   Labs (all labs ordered are listed, but only abnormal results are displayed) Labs Reviewed  COMPREHENSIVE METABOLIC PANEL - Abnormal; Notable for the following:       Result Value   Glucose, Bld 135 (*)    BUN 32 (*)    Creatinine, Ser 1.41 (*)    AST 73 (*)    ALT 64 (*)    GFR calc non Af Amer 51 (*)    GFR calc Af Amer 59 (*)    All other components within normal limits  CBC WITH DIFFERENTIAL/PLATELET - Abnormal; Notable for the following:    RBC 4.14 (*)    HCT 38.6 (*)    All other components within normal limits  ETHANOL  LAMOTRIGINE LEVEL   EKG  EKG Interpretation None      Radiology No results found.  Procedures Procedures   Medications Ordered in ED Medications  haloperidol lactate (HALDOL) injection 5 mg (5 mg Intramuscular Given 04/01/16 1756)  ziprasidone (GEODON)  injection 10 mg (10 mg Intramuscular Given 04/01/16 2020)    Initial Impression / Assessment and Plan / ED Course  I have reviewed the triage vital signs and the nursing notes.  Pertinent labs & imaging results that were available during my care of the patient were reviewed by me and considered in my medical decision making (see chart for details).     Patient presents after he had an episode where he stumbled this morning and hit his head on the door frame. The caretaker was worried that his sugar might be elevated or he might have some other medical issue going on as he is also has some slight swelling in his ankles. He is very agitated but his caretaker states this is baseline for him. He states he's at his normal mental  status. He hasn't noted any further episodes of stumbling. I don't see any obvious neurologic deficits. His labs are non-concerning other than he has mildly elevated glucose at 135. There is mild elevation in his LFTs and a mild elevation in his creatinine. He's afebrile. I initially ordered CT scans of his head and neck given his recent fall. However given his agitation we haven't been able to accomplish this. He was given some Haldol and Geodon with no change in behavior. I discussed this with the caretaker. I did advise him that we can try additional sedation but the caretaker doesn't feel that he needs the CT scans. I did advise him that we can't for sure rule out an intracranial hemorrhage. However it does seem that the fall was minor and he's at his baseline mental status.  The caretaker wants to take him back to his home and keep a close eye on him at home. He does advise that he will return if he has any change in behavior. I feel this is appropriate at this point. He is not on anticoagulants. I advised the caretaker to make sure the patient follows up with his primary care provider. Return precautions were given.  23:15 after discharge, I noted that a HR was not documented on  pt, but on listening to heart sounds during my initial physical exam, his HR appeared to be in the normal range.  Final Clinical Impressions(s) / ED Diagnoses   Final diagnoses:  Fall, initial encounter  Injury of head, initial encounter  Elevated serum creatinine   New Prescriptions New Prescriptions   No medications on file   I personally performed the services described in this documentation, which was scribed in my presence.  The recorded information has been reviewed and considered.     Rolan Bucco, MD 04/01/16 1610    Rolan Bucco, MD 04/01/16 9604

## 2016-04-01 NOTE — ED Notes (Signed)
EDP into room, pt restless, no changes.  

## 2016-04-01 NOTE — ED Notes (Addendum)
Pt remains alert, passively and reluctantly cooperative when not provoked, resistant to direction, medical care, interventions and touch. Multiple frequent attempts to force way out of room, tries to leave then returns to sitting on edge of bed. Aggression without malicious intent or follow thru.

## 2016-04-01 NOTE — ED Notes (Addendum)
Pt remains alert, no sedation effects noted with haldol, [facility], NAD, calm, personal group home staff remains with pt, updated on plan, given urinal, midline forehead wound/ scabbed abrasion, no active bleeding.

## 2016-04-01 NOTE — ED Notes (Addendum)
No changes, restless, sitting on edge of bed, watching TV.  Personal group home staff present, reports, "pt is at baseline, behaving per norm". Aware of need for urine, prompting initiated, pending void.

## 2016-04-01 NOTE — ED Notes (Addendum)
Pt agitated, aggressive with staff involvement, pushed EDP against wall with body, walked out of room, pt redirected by group home staff to return to room (successful).

## 2016-04-01 NOTE — ED Notes (Signed)
EDP into room, pt restless, no changes.

## 2016-04-01 NOTE — ED Triage Notes (Addendum)
Pt caretaker reports pt was having difficulty walking this morning. He advises he noticed an abrasion to the pt forehead so he may have fallen. Pt with hx of MR. Pt mental status is at baseline per caregiver. Pt becomes combative when anyone tries to touch him. Caregiver is also concerned that his feet are swollen.

## 2016-04-01 NOTE — ED Notes (Signed)
EDP discussed with pt and [facility] the results and d/c plan. No changes. Pt remains alert, active, NAD, calm & restless per norm ([facility] MAEx4, pacing & sitting.

## 2016-04-01 NOTE — ED Notes (Signed)
No changes, sedation NOT noted, sitting on edge of bed, watching TV, [facility]

## 2016-04-01 NOTE — ED Notes (Addendum)
Walked out of room down hall, re-directed back to room by personal group home staff.

## 2016-04-01 NOTE — ED Notes (Signed)
personal group home staff denies questions or needs

## 2016-04-01 NOTE — ED Notes (Signed)
Remains restless, sitting on edge of bed, watching TV, food eaten, remains resistant and reluctant.

## 2016-04-01 NOTE — ED Notes (Addendum)
Pt is still sitting on the side of the bed, becomes agitated when approached. MD made aware.

## 2016-04-02 ENCOUNTER — Other Ambulatory Visit (HOSPITAL_BASED_OUTPATIENT_CLINIC_OR_DEPARTMENT_OTHER): Payer: Medicare Other

## 2016-04-03 DIAGNOSIS — F250 Schizoaffective disorder, bipolar type: Secondary | ICD-10-CM | POA: Diagnosis not present

## 2016-04-03 LAB — LAMOTRIGINE LEVEL: LAMOTRIGINE LVL: 4.9 ug/mL (ref 2.0–20.0)

## 2016-04-15 ENCOUNTER — Emergency Department (HOSPITAL_BASED_OUTPATIENT_CLINIC_OR_DEPARTMENT_OTHER)
Admission: EM | Admit: 2016-04-15 | Discharge: 2016-04-15 | Disposition: A | Discharge status: Home / Self Care | Payer: Medicare Other | Attending: Emergency Medicine | Admitting: Emergency Medicine

## 2016-04-15 ENCOUNTER — Encounter (HOSPITAL_BASED_OUTPATIENT_CLINIC_OR_DEPARTMENT_OTHER): Payer: Self-pay

## 2016-04-15 DIAGNOSIS — H1132 Conjunctival hemorrhage, left eye: Secondary | ICD-10-CM | POA: Insufficient documentation

## 2016-04-15 DIAGNOSIS — H579 Unspecified disorder of eye and adnexa: Secondary | ICD-10-CM | POA: Diagnosis present

## 2016-04-15 NOTE — ED Provider Notes (Signed)
[facility] Provider [facility]   CSN: [id number] Arrival date & time: 04/15/16  2110  By signing my name below, I, Alyssa Grove, attest that this documentation has been prepared under the direction and in the presence of Renne Crigler, PA-C. Electronically Signed: Alyssa Grove, ED Scribe. 04/15/16. 10:01 PM.   History   Chief Complaint Chief Complaint  Patient presents with  . Eye Problem   History provided by: Friend. No language interpreter was used.   LEVEL 5 CAVEAT DUE TO MENTAL RETARDATION   HPI Comments: Vraj Denardo is a 66 y.o. male with PMHx of TBI who presents to the Emergency Department complaining of left eye redness onset today. Denies recent head trauma. No coughing, vomiting. No blood thinners.   Past Medical History:  Diagnosis Date  . MR (mental retardation)   . Schizophrenia (HCC)     There are no active problems to display for this patient.   History reviewed. No pertinent surgical history.     Home Medications    Prior to Admission medications   Medication Sig Start Date End Date Taking? Authorizing Provider  benztropine (COGENTIN) 2 MG tablet Take 2 mg by mouth 2 (two) times daily.     Historical Provider, MD  ciprofloxacin (CIPRO) 500 MG tablet Take 500 mg by mouth 2 (two) times daily. 7 day course completed Friday 01/12/12   April Palumbo, MD  haloperidol (HALDOL) 5 MG tablet Take 25 mg by mouth at bedtime.    Historical Provider, MD  lamoTRIgine (LAMICTAL) 100 MG tablet Take 100 mg by mouth 2 (two) times daily. 125mg  (100mg  tab and 25mg  tab) in the morning and 150mg  (100mg  tab and 2x 25mg  tab) in the evening    Historical Provider, MD  lamoTRIgine (LAMICTAL) 25 MG tablet Take 25-50 mg by mouth 2 (two) times daily. 125mg  (100mg  tab and 25mg  tab) in the morning and 150mg  (100mg  tab and 2x 25mg  tab) in the evening    Historical Provider, MD  omeprazole (PRILOSEC) 20 MG capsule Take 20 mg by mouth every morning.     Historical Provider, MD    Oxcarbazepine (TRILEPTAL) 300 MG tablet Take 300-600 mg by mouth 2 (two) times daily. 1 tablet in the morning and 2 in the evening    Historical Provider, MD  polycarbophil (FIBERCON) 625 MG tablet Take 625 mg by mouth every morning.     Historical Provider, MD  potassium chloride (K-DUR,KLOR-CON) 10 MEQ tablet Take 10 mEq by mouth every morning.    Historical Provider, MD  risperiDONE (RISPERDAL) 3 MG tablet Take 3 mg by mouth 2 (two) times daily.    Historical Provider, MD  Tamsulosin HCl (FLOMAX) 0.4 MG CAPS Take 0.8 mg by mouth daily after supper.     Historical Provider, MD  traZODone (DESYREL) 100 MG tablet Take 100 mg by mouth at bedtime.    Historical Provider, MD   Family History No family history on file.  Social History Social History  Substance Use Topics  . Smoking status: Never Smoker  . Smokeless tobacco: Never Used  . Alcohol use No   Allergies   Patient has no known allergies.  Review of Systems Review of Systems  Unable to perform ROS: Other     Physical Exam Updated Vital Signs BP (!) 136/108 (BP Location: Left Arm)   Pulse 91   Resp 20   Physical Exam  Eyes: Right conjunctiva is not injected. Right conjunctiva has no hemorrhage. Left conjunctiva is not injected. Left conjunctiva  has a hemorrhage.  Medial subconjunctival hemorrhage     ED Treatments / Results   Procedures Procedures (including critical care time)  Medications Ordered in ED Medications - No data to display   Initial Impression / Assessment and Plan / ED Course  I have reviewed the triage vital signs and the nursing notes.  Pertinent labs & imaging results that were available during my care of the patient were reviewed by me and considered in my medical decision making (see chart for details).     Patient seen and examined. H/o TBI/MR. Non-cooperative with exam. I am able to see the eye well Enough to identify the subconjunctival hemorrhage. Patient very difficult to redirect.  No other obvious acute medical issue at this time.  Patient discussed with Dr. Rennis ChrisJacobowitz who has seen patient.  Final Clinical Impressions(s) / ED Diagnoses   Final diagnoses:  Subconjunctival hemorrhage of left eye   Patient with likely spontaneous subconjunctival hemorrhage of the left eye. Not cooperative with exam. Do not feel that patient needs to be sedated for detailed exam given apparent minor symptoms. Cornea appears clear.  New Prescriptions New Prescriptions   No medications on file     Renne CriglerJoshua Dashaun Onstott, Cordelia Poche-C 04/15/16 2207    Doug SouSam Jacubowitz, MD 04/15/16 2248

## 2016-04-15 NOTE — ED Provider Notes (Signed)
Patient with tiny subconjunctival hemorrhage at left eye. No further treatment or diagnostic testing needed   Doug SouSam Eligio Angert, MD 04/15/16 2204

## 2016-04-15 NOTE — ED Notes (Signed)
Caregiver given d/c instructions as per chart. Verbalizes understanding. No questions.

## 2016-04-15 NOTE — Discharge Instructions (Signed)
Please read and follow all provided instructions.  Your diagnoses today include:  1. Subconjunctival hemorrhage of left eye    Tests performed today include:  Vital signs. See below for your results today.   Medications prescribed:   None  Take any prescribed medications only as directed.  Home care instructions:  Follow any educational materials contained in this packet.  Follow-up instructions: Please follow-up with your primary care doctor in the next 2-3 days for further evaluation of your symptoms if needed.  Return instructions:   Please return to the Emergency Department if you experience worsening symptoms.   Please return immediately if you develop severe pain, pus drainage, new change in vision, or fever.  Please return if you have any other emergent concerns.  Additional Information:  Your vital signs today were: BP (!) 136/108 (BP Location: Left Arm)    Pulse 91    Resp 20  If your blood pressure (BP) was elevated above 135/85 this visit, please have this repeated by your doctor within one month. ---------------

## 2016-04-27 ENCOUNTER — Emergency Department (HOSPITAL_BASED_OUTPATIENT_CLINIC_OR_DEPARTMENT_OTHER)
Admission: EM | Admit: 2016-04-27 | Discharge: 2016-04-27 | Disposition: A | Discharge status: Home / Self Care | Payer: Medicare Other | Attending: Emergency Medicine | Admitting: Emergency Medicine

## 2016-04-27 ENCOUNTER — Emergency Department (HOSPITAL_BASED_OUTPATIENT_CLINIC_OR_DEPARTMENT_OTHER): Payer: Medicare Other

## 2016-04-27 ENCOUNTER — Encounter (HOSPITAL_BASED_OUTPATIENT_CLINIC_OR_DEPARTMENT_OTHER): Payer: Self-pay

## 2016-04-27 DIAGNOSIS — I6782 Cerebral ischemia: Secondary | ICD-10-CM | POA: Diagnosis not present

## 2016-04-27 DIAGNOSIS — R269 Unspecified abnormalities of gait and mobility: Secondary | ICD-10-CM | POA: Diagnosis present

## 2016-04-27 DIAGNOSIS — Z87820 Personal history of traumatic brain injury: Secondary | ICD-10-CM | POA: Diagnosis not present

## 2016-04-27 DIAGNOSIS — R2681 Unsteadiness on feet: Secondary | ICD-10-CM | POA: Diagnosis not present

## 2016-04-27 DIAGNOSIS — R2689 Other abnormalities of gait and mobility: Secondary | ICD-10-CM | POA: Diagnosis not present

## 2016-04-27 LAB — BASIC METABOLIC PANEL
ANION GAP: 4 — AB (ref 5–15)
BUN: 31 mg/dL — ABNORMAL HIGH (ref 6–20)
CHLORIDE: 107 mmol/L (ref 101–111)
CO2: 32 mmol/L (ref 22–32)
Calcium: 9.9 mg/dL (ref 8.9–10.3)
Creatinine, Ser: 1.28 mg/dL — ABNORMAL HIGH (ref 0.61–1.24)
GFR calc Af Amer: >60 mL/min (ref >60)
GFR calc non Af Amer: 58 mL/min — ABNORMAL LOW (ref >60)
Glucose, Bld: 78 mg/dL (ref 65–99)
POTASSIUM: 4.8 mmol/L (ref 3.5–5.1)
Sodium: 143 mmol/L (ref 135–145)

## 2016-04-27 LAB — CBC WITH DIFFERENTIAL/PLATELET
Basophils Absolute: 0 10*3/uL (ref 0.0–0.1)
Basophils Relative: 0 %
Eosinophils Absolute: 0 10*3/uL (ref 0.0–0.7)
Eosinophils Relative: 0 %
HEMATOCRIT: 34.3 % — AB (ref 39.0–52.0)
HEMOGLOBIN: 12.1 g/dL — AB (ref 13.0–17.0)
LYMPHS ABS: 0.8 10*3/uL (ref 0.7–4.0)
Lymphocytes Relative: 14 %
MCH: 33.1 pg (ref 26.0–34.0)
MCHC: 35.3 g/dL (ref 30.0–36.0)
MCV: 93.7 fL (ref 78.0–100.0)
MONOS PCT: 4 %
Monocytes Absolute: 0.2 10*3/uL (ref 0.1–1.0)
NEUTROS ABS: 4.7 10*3/uL (ref 1.7–7.7)
NEUTROS PCT: 82 %
Platelets: 132 10*3/uL — ABNORMAL LOW (ref 150–400)
RBC: 3.66 MIL/uL — AB (ref 4.22–5.81)
RDW: 12.7 % (ref 11.5–15.5)
WBC: 5.7 10*3/uL (ref 4.0–10.5)

## 2016-04-27 NOTE — Discharge Instructions (Signed)
Continue your medications as before.  Follow-up with your primary care doctor next week to discuss your medications and possible further workup.

## 2016-04-27 NOTE — ED Notes (Signed)
Patient transported to CT 

## 2016-04-27 NOTE — ED Triage Notes (Addendum)
Per caretaker pt with "off balance" gait x today-pt seen for same in Jan-pt nonverbal-moving about in w/c-swinging legs from side to side-will not follow commands for VS-baseline per caretaker

## 2016-04-27 NOTE — ED Provider Notes (Signed)
[facility]   CSN: [id number] Arrival date & time: 04/27/16  2137  By signing my name below, I, Modena Jansky, attest that this documentation has been prepared under the direction and in the presence of Geoffery Lyons, MD. Electronically Signed: Modena Jansky, Scribe. 04/27/2016. 9:49 PM.  History   Chief Complaint Chief Complaint  Patient presents with  . Gait Problem   The history is provided by a caregiver and medical records. No language interpreter was used.   LEVEL 5 CAVEAT DUE TO NONVERBAL PT  HPI Comments: Antonio Montoya is a 66 y.o. male with a PMHx of mental retardation and schizophrenia who presents to the Emergency Department complaining of a gait problem that started today. He was seen in the ED on 04/15/16 for left eye concern and 04/01/16 for a fall/head injury. His caregiver at assisted living states that he seems off-balance today. He is nonverbal but able to ambulate without problems at baseline. He denies any decreased appetite.     PCP: Shayne Alken, MD (Inactive)  Past Medical History:  Diagnosis Date  . MR (mental retardation)   . Schizophrenia (HCC)     There are no active problems to display for this patient.   History reviewed. No pertinent surgical history.     Home Medications    Prior to Admission medications   Medication Sig Start Date End Date Taking? Authorizing Provider  benztropine (COGENTIN) 2 MG tablet Take 2 mg by mouth 2 (two) times daily.     Historical Provider, MD  ciprofloxacin (CIPRO) 500 MG tablet Take 500 mg by mouth 2 (two) times daily. 7 day course completed Friday 01/12/12   April Palumbo, MD  haloperidol (HALDOL) 5 MG tablet Take 25 mg by mouth at bedtime.    Historical Provider, MD  lamoTRIgine (LAMICTAL) 100 MG tablet Take 100 mg by mouth 2 (two) times daily.  (  tab and  tab) in the morning and  (  tab and 2x  tab) in the evening    Historical Provider, MD  lamoTRIgine  (LAMICTAL) 25 MG tablet Take 25-50 mg by mouth 2 (two) times daily.  (  tab and  tab) in the morning and  (  tab and 2x  tab) in the evening    Historical Provider, MD  omeprazole (PRILOSEC) 20 MG capsule Take 20 mg by mouth every morning.     Historical Provider, MD  Oxcarbazepine (TRILEPTAL) 300 MG tablet Take 300-600 mg by mouth 2 (two) times daily. 1 tablet in the morning and 2 in the evening    Historical Provider, MD  polycarbophil (FIBERCON) 625 MG tablet Take 625 mg by mouth every morning.     Historical Provider, MD  potassium chloride (K-DUR,KLOR-CON) 10 MEQ tablet Take 10 mEq by mouth every morning.    Historical Provider, MD  risperiDONE (RISPERDAL) 3 MG tablet Take 3 mg by mouth 2 (two) times daily.    Historical Provider, MD  Tamsulosin HCl (FLOMAX) 0.4 MG CAPS Take 0.8 mg by mouth daily after supper.     Historical Provider, MD  traZODone (DESYREL) 100 MG tablet Take 100 mg by mouth at bedtime.    Historical Provider, MD    Family History No family history on file.  Social History Social History  Substance Use Topics  . Smoking status: Never Smoker  . Smokeless tobacco: Never Used  . Alcohol use No     Allergies   Patient has no known allergies.   Review of Systems Review of Systems  Unable to perform ROS: Patient nonverbal     Physical Exam Updated Vital Signs BP 114/85 (BP Location: Right Arm)   Physical Exam  Constitutional: He appears well-developed and well-nourished. No distress.  HENT:  Head: Normocephalic and atraumatic.  Eyes: Conjunctivae and EOM are normal.  Neck: Normal range of motion. Neck supple.  Cardiovascular: Normal rate, regular rhythm, normal heart sounds and intact distal pulses.   Pulmonary/Chest: Effort normal and breath sounds normal. No respiratory distress.  Abdominal: Soft. He exhibits no distension. There is no tenderness.  Musculoskeletal: Normal range of motion.  Neurological: He is alert.  Patient  is awake and alert. He is difficult to examine secondary to his baseline traumatic brain injury and mental retardation. Does move all 4 extremities.  Skin: Skin is warm and dry.  Psychiatric: He has a normal mood and affect. Judgment normal.  Nursing note and vitals reviewed.    ED Treatments / Results  DIAGNOSTIC STUDIES: Oxygen Saturation is 99% on RA, normal by my interpretation.    COORDINATION OF CARE: 9:53 PM- Pt advised of plan for treatment and pt agrees.  Labs (all labs ordered are listed, but only abnormal results are displayed) Labs Reviewed  BASIC METABOLIC PANEL  CBC WITH DIFFERENTIAL/PLATELET    EKG  EKG Interpretation None       Radiology No results found.  Procedures Procedures (including critical care time)  Medications Ordered in ED Medications - No data to display   Initial Impression / Assessment and Plan / ED Course  I have reviewed the triage vital signs and the nursing notes.  Pertinent labs & imaging results that were available during my care of the patient were reviewed by me and considered in my medical decision making (see chart for details).  Laboratory studies are unremarkable and head CT shows no obvious abnormality. Patient's change in stability may be related to medications. I've advised the caretaker to have him follow-up next week with his primary Dr. I see nothing tonight that appears emergent and requires inpatient care.  Final Clinical Impressions(s) / ED Diagnoses   Final diagnoses:  None    New Prescriptions New Prescriptions   No medications on file   I personally performed the services described in this documentation, which was scribed in my presence. The recorded information has been reviewed and is accurate.        Geoffery Lyons, MD 04/27/16 [phone]

## 2016-04-30 ENCOUNTER — Encounter (HOSPITAL_BASED_OUTPATIENT_CLINIC_OR_DEPARTMENT_OTHER): Payer: Self-pay | Admitting: *Deleted

## 2016-04-30 DIAGNOSIS — N401 Enlarged prostate with lower urinary tract symptoms: Secondary | ICD-10-CM

## 2016-04-30 DIAGNOSIS — R451 Restlessness and agitation: Secondary | ICD-10-CM | POA: Diagnosis not present

## 2016-04-30 DIAGNOSIS — R339 Retention of urine, unspecified: Secondary | ICD-10-CM | POA: Diagnosis not present

## 2016-04-30 DIAGNOSIS — Z79899 Other long term (current) drug therapy: Secondary | ICD-10-CM

## 2016-04-30 DIAGNOSIS — I6782 Cerebral ischemia: Secondary | ICD-10-CM | POA: Diagnosis not present

## 2016-04-30 DIAGNOSIS — N179 Acute kidney failure, unspecified: Secondary | ICD-10-CM | POA: Diagnosis not present

## 2016-04-30 DIAGNOSIS — M7989 Other specified soft tissue disorders: Secondary | ICD-10-CM | POA: Diagnosis not present

## 2016-04-30 DIAGNOSIS — R3129 Other microscopic hematuria: Secondary | ICD-10-CM | POA: Diagnosis not present

## 2016-04-30 DIAGNOSIS — R4182 Altered mental status, unspecified: Secondary | ICD-10-CM | POA: Diagnosis not present

## 2016-04-30 DIAGNOSIS — S6992XA Unspecified injury of left wrist, hand and finger(s), initial encounter: Secondary | ICD-10-CM | POA: Diagnosis not present

## 2016-04-30 DIAGNOSIS — D696 Thrombocytopenia, unspecified: Secondary | ICD-10-CM | POA: Diagnosis not present

## 2016-04-30 DIAGNOSIS — N138 Other obstructive and reflux uropathy: Secondary | ICD-10-CM | POA: Diagnosis not present

## 2016-04-30 DIAGNOSIS — L89322 Pressure ulcer of left buttock, stage 2: Secondary | ICD-10-CM | POA: Diagnosis not present

## 2016-04-30 DIAGNOSIS — Z6825 Body mass index (BMI) 25.0-25.9, adult: Secondary | ICD-10-CM | POA: Diagnosis not present

## 2016-04-30 DIAGNOSIS — R39198 Other difficulties with micturition: Secondary | ICD-10-CM | POA: Diagnosis not present

## 2016-04-30 DIAGNOSIS — L03114 Cellulitis of left upper limb: Secondary | ICD-10-CM | POA: Diagnosis not present

## 2016-04-30 DIAGNOSIS — Z8659 Personal history of other mental and behavioral disorders: Secondary | ICD-10-CM | POA: Diagnosis not present

## 2016-04-30 DIAGNOSIS — E11649 Type 2 diabetes mellitus with hypoglycemia without coma: Secondary | ICD-10-CM | POA: Diagnosis not present

## 2016-04-30 NOTE — ED Triage Notes (Signed)
PT has had difficulty urinating since last night "only goes a little bit, not a lot" per caregiver. Seen at PCP today and told he did not have an infection, had "traces of blood" he may have a kidney stone and told to push fluids. Caregiver says he is still not urinating like he should. No vomiting, diarrhea, or fevers.

## 2016-05-01 ENCOUNTER — Emergency Department (HOSPITAL_BASED_OUTPATIENT_CLINIC_OR_DEPARTMENT_OTHER): Payer: Medicare Other

## 2016-05-01 ENCOUNTER — Inpatient Hospital Stay (HOSPITAL_BASED_OUTPATIENT_CLINIC_OR_DEPARTMENT_OTHER)
Admission: EM | Admit: 2016-05-01 | Discharge: 2016-05-24 | DRG: 713 | Disposition: A | Discharge status: Home / Self Care | Payer: Medicare Other | Attending: Family Medicine | Admitting: Family Medicine

## 2016-05-01 ENCOUNTER — Encounter (HOSPITAL_BASED_OUTPATIENT_CLINIC_OR_DEPARTMENT_OTHER): Payer: Self-pay

## 2016-05-01 ENCOUNTER — Emergency Department (HOSPITAL_BASED_OUTPATIENT_CLINIC_OR_DEPARTMENT_OTHER)
Admission: EM | Admit: 2016-05-01 | Discharge: 2016-05-01 | Disposition: A | Discharge status: Home / Self Care | Payer: Medicare Other | Source: Home / Self Care | Attending: Emergency Medicine | Admitting: Emergency Medicine

## 2016-05-01 DIAGNOSIS — N138 Other obstructive and reflux uropathy: Secondary | ICD-10-CM | POA: Diagnosis present

## 2016-05-01 DIAGNOSIS — I6782 Cerebral ischemia: Secondary | ICD-10-CM | POA: Diagnosis not present

## 2016-05-01 DIAGNOSIS — N401 Enlarged prostate with lower urinary tract symptoms: Secondary | ICD-10-CM

## 2016-05-01 DIAGNOSIS — R4182 Altered mental status, unspecified: Secondary | ICD-10-CM

## 2016-05-01 DIAGNOSIS — R451 Restlessness and agitation: Secondary | ICD-10-CM | POA: Diagnosis present

## 2016-05-01 DIAGNOSIS — X838XXA Intentional self-harm by other specified means, initial encounter: Secondary | ICD-10-CM

## 2016-05-01 DIAGNOSIS — R001 Bradycardia, unspecified: Secondary | ICD-10-CM | POA: Diagnosis present

## 2016-05-01 DIAGNOSIS — Z781 Physical restraint status: Secondary | ICD-10-CM

## 2016-05-01 DIAGNOSIS — L03114 Cellulitis of left upper limb: Secondary | ICD-10-CM | POA: Diagnosis not present

## 2016-05-01 DIAGNOSIS — S6992XA Unspecified injury of left wrist, hand and finger(s), initial encounter: Secondary | ICD-10-CM | POA: Diagnosis not present

## 2016-05-01 DIAGNOSIS — L8990 Pressure ulcer of unspecified site, unspecified stage: Secondary | ICD-10-CM | POA: Insufficient documentation

## 2016-05-01 DIAGNOSIS — N179 Acute kidney failure, unspecified: Secondary | ICD-10-CM | POA: Diagnosis not present

## 2016-05-01 DIAGNOSIS — L03119 Cellulitis of unspecified part of limb: Secondary | ICD-10-CM

## 2016-05-01 DIAGNOSIS — Z8659 Personal history of other mental and behavioral disorders: Secondary | ICD-10-CM | POA: Diagnosis not present

## 2016-05-01 DIAGNOSIS — T68XXXA Hypothermia, initial encounter: Secondary | ICD-10-CM | POA: Diagnosis present

## 2016-05-01 DIAGNOSIS — A419 Sepsis, unspecified organism: Secondary | ICD-10-CM

## 2016-05-01 DIAGNOSIS — F99 Mental disorder, not otherwise specified: Secondary | ICD-10-CM | POA: Diagnosis not present

## 2016-05-01 DIAGNOSIS — R339 Retention of urine, unspecified: Secondary | ICD-10-CM

## 2016-05-01 DIAGNOSIS — R410 Disorientation, unspecified: Secondary | ICD-10-CM

## 2016-05-01 DIAGNOSIS — E11649 Type 2 diabetes mellitus with hypoglycemia without coma: Secondary | ICD-10-CM | POA: Diagnosis not present

## 2016-05-01 DIAGNOSIS — F209 Schizophrenia, unspecified: Secondary | ICD-10-CM | POA: Diagnosis present

## 2016-05-01 DIAGNOSIS — R7989 Other specified abnormal findings of blood chemistry: Secondary | ICD-10-CM

## 2016-05-01 DIAGNOSIS — E876 Hypokalemia: Secondary | ICD-10-CM | POA: Diagnosis present

## 2016-05-01 DIAGNOSIS — T43505A Adverse effect of unspecified antipsychotics and neuroleptics, initial encounter: Secondary | ICD-10-CM | POA: Diagnosis present

## 2016-05-01 DIAGNOSIS — R680 Hypothermia, not associated with low environmental temperature: Secondary | ICD-10-CM | POA: Diagnosis present

## 2016-05-01 DIAGNOSIS — F79 Unspecified intellectual disabilities: Secondary | ICD-10-CM | POA: Diagnosis present

## 2016-05-01 DIAGNOSIS — R338 Other retention of urine: Principal | ICD-10-CM

## 2016-05-01 DIAGNOSIS — R8271 Bacteriuria: Secondary | ICD-10-CM | POA: Diagnosis present

## 2016-05-01 DIAGNOSIS — M7989 Other specified soft tissue disorders: Secondary | ICD-10-CM | POA: Diagnosis not present

## 2016-05-01 DIAGNOSIS — D696 Thrombocytopenia, unspecified: Secondary | ICD-10-CM

## 2016-05-01 DIAGNOSIS — S06890S Other specified intracranial injury without loss of consciousness, sequela: Secondary | ICD-10-CM

## 2016-05-01 DIAGNOSIS — N281 Cyst of kidney, acquired: Secondary | ICD-10-CM | POA: Diagnosis present

## 2016-05-01 DIAGNOSIS — R945 Abnormal results of liver function studies: Secondary | ICD-10-CM

## 2016-05-01 DIAGNOSIS — L89322 Pressure ulcer of left buttock, stage 2: Secondary | ICD-10-CM | POA: Diagnosis present

## 2016-05-01 DIAGNOSIS — Z79899 Other long term (current) drug therapy: Secondary | ICD-10-CM

## 2016-05-01 DIAGNOSIS — S6982XA Other specified injuries of left wrist, hand and finger(s), initial encounter: Secondary | ICD-10-CM | POA: Diagnosis present

## 2016-05-01 DIAGNOSIS — R319 Hematuria, unspecified: Secondary | ICD-10-CM | POA: Diagnosis present

## 2016-05-01 LAB — BASIC METABOLIC PANEL
Anion gap: 6 (ref 5–15)
Anion gap: 9 (ref 5–15)
BUN: 36 mg/dL — AB (ref 6–20)
BUN: 44 mg/dL — AB (ref 6–20)
CALCIUM: 10.2 mg/dL (ref 8.9–10.3)
CHLORIDE: 105 mmol/L (ref 101–111)
CO2: 30 mmol/L (ref 22–32)
CO2: 31 mmol/L (ref 22–32)
CREATININE: 1.31 mg/dL — AB (ref 0.61–1.24)
CREATININE: 1.41 mg/dL — AB (ref 0.61–1.24)
Calcium: 9.5 mg/dL (ref 8.9–10.3)
Chloride: 99 mmol/L — ABNORMAL LOW (ref 101–111)
GFR calc Af Amer: 59 mL/min — ABNORMAL LOW (ref >60)
GFR calc non Af Amer: 51 mL/min — ABNORMAL LOW (ref >60)
GFR calc non Af Amer: 56 mL/min — ABNORMAL LOW (ref >60)
GLUCOSE: 64 mg/dL — AB (ref 65–99)
Glucose, Bld: 100 mg/dL — ABNORMAL HIGH (ref 65–99)
POTASSIUM: 3.9 mmol/L (ref 3.5–5.1)
Potassium: 4.3 mmol/L (ref 3.5–5.1)
SODIUM: 138 mmol/L (ref 135–145)
SODIUM: 142 mmol/L (ref 135–145)

## 2016-05-01 LAB — CBC WITH DIFFERENTIAL/PLATELET
BASOS ABS: 0 10*3/uL (ref 0.0–0.1)
BASOS PCT: 0 %
Band Neutrophils: 3 %
Eosinophils Absolute: 0 10*3/uL (ref 0.0–0.7)
Eosinophils Relative: 0 %
HEMATOCRIT: 30.1 % — AB (ref 39.0–52.0)
Hemoglobin: 10.5 g/dL — ABNORMAL LOW (ref 13.0–17.0)
LYMPHS ABS: 0.3 10*3/uL — AB (ref 0.7–4.0)
Lymphocytes Relative: 3 %
MCH: 32.7 pg (ref 26.0–34.0)
MCHC: 34.9 g/dL (ref 30.0–36.0)
MCV: 93.8 fL (ref 78.0–100.0)
MONO ABS: 0.6 10*3/uL (ref 0.1–1.0)
Monocytes Relative: 6 %
NEUTROS ABS: 9.9 10*3/uL — AB (ref 1.7–7.7)
NEUTROS PCT: 88 %
PLATELETS: 78 10*3/uL — AB (ref 150–400)
RBC: 3.21 MIL/uL — ABNORMAL LOW (ref 4.22–5.81)
RDW: 12.6 % (ref 11.5–15.5)
WBC: 10.8 10*3/uL — ABNORMAL HIGH (ref 4.0–10.5)

## 2016-05-01 LAB — URINALYSIS, ROUTINE W REFLEX MICROSCOPIC
BILIRUBIN URINE: NEGATIVE
Glucose, UA: NEGATIVE mg/dL
Hgb urine dipstick: NEGATIVE
KETONES UR: NEGATIVE mg/dL
Leukocytes, UA: NEGATIVE
NITRITE: NEGATIVE
PH: 7 (ref 5.0–8.0)
Protein, ur: NEGATIVE mg/dL
Specific Gravity, Urine: 1.015 (ref 1.005–1.030)

## 2016-05-01 LAB — AMMONIA: Ammonia: 24 umol/L (ref 9–35)

## 2016-05-01 LAB — I-STAT CG4 LACTIC ACID, ED: Lactic Acid, Venous: 0.94 mmol/L (ref 0.5–1.9)

## 2016-05-01 MED ORDER — LORAZEPAM 2 MG/ML IJ SOLN
2.0000 mg | Freq: Once | INTRAMUSCULAR | Status: AC
  Administered 2016-05-01: 2 mg via INTRAMUSCULAR
  Filled 2016-05-01: qty 1

## 2016-05-01 MED ORDER — SODIUM CHLORIDE 0.9 % IV BOLUS (SEPSIS)
500.0000 mL | Freq: Once | INTRAVENOUS | Status: AC
  Administered 2016-05-01: 500 mL via INTRAVENOUS

## 2016-05-01 MED ORDER — CLINDAMYCIN PHOSPHATE 600 MG/50ML IV SOLN
600.0000 mg | Freq: Once | INTRAVENOUS | Status: AC
  Administered 2016-05-01: 600 mg via INTRAVENOUS
  Filled 2016-05-01: qty 50

## 2016-05-01 MED ORDER — TAMSULOSIN HCL 0.4 MG PO CAPS: 0.8000 mg | ORAL_CAPSULE | Freq: Every day | ORAL | 0 refills | Status: DC

## 2016-05-01 MED ORDER — DIPHENHYDRAMINE HCL 50 MG/ML IJ SOLN
50.0000 mg | Freq: Once | INTRAMUSCULAR | Status: AC
  Administered 2016-05-01: 50 mg via INTRAVENOUS
  Filled 2016-05-01: qty 1

## 2016-05-01 MED ORDER — HALOPERIDOL LACTATE 5 MG/ML IJ SOLN
INTRAMUSCULAR | Status: AC
  Administered 2016-05-01: 7.5 mg
  Filled 2016-05-01: qty 2

## 2016-05-01 MED ORDER — ZIPRASIDONE MESYLATE 20 MG IM SOLR
10.0000 mg | Freq: Once | INTRAMUSCULAR | Status: AC
  Administered 2016-05-01: 10 mg via INTRAMUSCULAR
  Filled 2016-05-01: qty 20

## 2016-05-01 NOTE — ED Notes (Signed)
Patient is unable to comprehend treatment and type of care that is needed at this time. The patient is kicking and moving arms in a manner that could hurt staff members. Patient mentally not cooperative no physically cooperative with care. The caregiver at the bedside unable to control the patient. EDP aware and medication orders received. 1 RN and 2 EMts at the bedside and meds given per orders.

## 2016-05-01 NOTE — ED Notes (Signed)
Patient is sleeping and more cooperative with care. Placed on cardiac monitor post medication administration

## 2016-05-01 NOTE — ED Provider Notes (Signed)
[facility]   CSN: [id number] Arrival date & time: 04/30/16  2147  By signing my name below, I, [name], attest that this documentation has been prepared under the direction and in the presence of Kei Langhorst, MD. Electronically Signed: [name], ED Scribe. 05/01/16. 12:25 AM.  History   Chief Complaint No chief complaint on file.  LEVEL V CAVEAT: HPI and ROS limited due to MR  The history is provided by medical records and a caregiver. The history is limited by the condition of the patient (MR). No language interpreter was used.  Dysuria   This is a new problem. The current episode started 12 to 24 hours ago. The problem occurs every urination. The problem has not changed since onset.There has been no fever. He is not sexually active. Associated symptoms include hesitancy. He has tried nothing for the symptoms. His past medical history does not include kidney stones.   HPI Comments: Antonio Montoya is a 66 y.o. male w/ a h/o MR, who presents to the Emergency Department with his caregiver who is c/o inability urinating beginning 18 hours ago. Per caregiver, pt has been unable to void urine for approximately 18 hours. He was seen by his PCP today for same, and at that time his caregiver was informed that he did not have a UTI but had "traces of blood". Caregiver denies any vomiting, diarrhea, or fever.   Past Medical History:  Diagnosis Date  . MR (mental retardation)   . Schizophrenia (HCC)     There are no active problems to display for this patient.   History reviewed. No pertinent surgical history.     Home Medications    Prior to Admission medications   Medication Sig Start Date End Date Taking? Authorizing Provider  benztropine (COGENTIN) 2 MG tablet Take 2 mg by mouth 2 (two) times daily.     Historical Provider, MD  ciprofloxacin (CIPRO) 500 MG tablet Take 500 mg by mouth 2 (two) times daily. 7 day course completed  Friday 01/12/12   Caliph Borowiak, MD  haloperidol (HALDOL) 5 MG tablet Take 25 mg by mouth at bedtime.    Historical Provider, MD  lamoTRIgine (LAMICTAL) 100 MG tablet Take 100 mg by mouth 2 (two) times daily. 125mg  (100mg  tab and 25mg  tab) in the morning and 150mg  (100mg  tab and 2x 25mg  tab) in the evening    Historical Provider, MD  lamoTRIgine (LAMICTAL) 25 MG tablet Take 25-50 mg by mouth 2 (two) times daily. 125mg  (100mg  tab and 25mg  tab) in the morning and 150mg  (100mg  tab and 2x 25mg  tab) in the evening    Historical Provider, MD  omeprazole (PRILOSEC) 20 MG capsule Take 20 mg by mouth every morning.     Historical Provider, MD  Oxcarbazepine (TRILEPTAL) 300 MG tablet Take 300-600 mg by mouth 2 (two) times daily. 1 tablet in the morning and 2 in the evening    Historical Provider, MD  polycarbophil (FIBERCON) 625 MG tablet Take 625 mg by mouth every morning.     Historical Provider, MD  potassium chloride (K-DUR,KLOR-CON) 10 MEQ tablet Take 10 mEq by mouth every morning.    Historical Provider, MD  risperiDONE (RISPERDAL) 3 MG tablet Take 3 mg by mouth 2 (two) times daily.    Historical Provider, MD  Tamsulosin HCl (FLOMAX) 0.4 MG CAPS Take 0.8 mg by mouth daily after supper.     Historical Provider, MD  traZODone (DESYREL) 100 MG tablet Take 100  mg by mouth at bedtime.    Historical Provider, MD    Family History No family history on file.  Social History Social History  Substance Use Topics  . Smoking status: Never Smoker  . Smokeless tobacco: Never Used  . Alcohol use No     Allergies   Patient has no known allergies.   Review of Systems Review of Systems  Unable to perform ROS: Other (MR)  Constitutional: Negative for fever.  Genitourinary: Positive for difficulty urinating, dysuria and hesitancy.     Physical Exam Updated Vital Signs BP (!) 166/132 Comment: pt is moving all around, resisting against staff during triage assesment  Pulse 63   Resp 18   SpO2 100%    Physical Exam  Constitutional: He appears well-developed and well-nourished.  HENT:  Head: Normocephalic and atraumatic.  Mouth/Throat: Oropharynx is clear and moist. No oropharyngeal exudate.  Eyes: Conjunctivae and EOM are normal. Pupils are equal, round, and reactive to light. Right eye exhibits no discharge. Left eye exhibits no discharge. No scleral icterus.  Neck: Normal range of motion. Neck supple. No JVD present. No tracheal deviation present.  Trachea is midline. No stridor or carotid bruits.  Cardiovascular: Normal rate, regular rhythm, normal heart sounds and intact distal pulses.   No murmur heard. Pulmonary/Chest: Effort normal and breath sounds normal. No stridor. No respiratory distress. He has no wheezes. He has no rales.  Lungs CTA bilaterally.  Abdominal: Soft. Bowel sounds are normal. He exhibits no distension. There is no tenderness. There is no rebound and no guarding.  Genitourinary:  Genitourinary Comments: Catheter is draining clear fluid on exam into leg bag.   Musculoskeletal: Normal range of motion. He exhibits no edema or tenderness.  All compartments are soft. No palpable cords.   Lymphadenopathy:    He has no cervical adenopathy.  Neurological: He is alert. He has normal reflexes. He displays normal reflexes.  Skin: Skin is warm and dry. Capillary refill takes less than 2 seconds.  Psychiatric:  Unable to assess.   Nursing note and vitals reviewed.  ED Treatments / Results   Vitals:   05/01/16 0045 05/01/16 0137  BP:  107/68  Pulse: (!) 58 (!) 54  Resp:  18   DIAGNOSTIC STUDIES: Oxygen Saturation is 100% on RA, normal by my interpretation.   Labs Results for orders placed or performed during the hospital encounter of 05/01/16  Basic metabolic panel  Result Value Ref Range   Sodium 138 135 - 145 mmol/L   Potassium 4.3 3.5 - 5.1 mmol/L   Chloride 99 (L) 101 - 111 mmol/L   CO2 30 22 - 32 mmol/L   Glucose, Bld 100 (H) 65 - 99 mg/dL   BUN  36 (H) 6 - 20 mg/dL   Creatinine, Ser 1.61 (H) 0.61 - 1.24 mg/dL   Calcium 09.6 8.9 - 04.5 mg/dL   GFR calc non Af Amer 56 (L) >60 mL/min   GFR calc Af Amer >60 >60 mL/min   Anion gap 9 5 - 15  Urinalysis, Routine w reflex microscopic  Result Value Ref Range   Color, Urine YELLOW YELLOW   APPearance CLOUDY (A) CLEAR   Specific Gravity, Urine 1.015 1.005 - 1.030   pH 7.0 5.0 - 8.0   Glucose, UA NEGATIVE NEGATIVE mg/dL   Hgb urine dipstick NEGATIVE NEGATIVE   Bilirubin Urine NEGATIVE NEGATIVE   Ketones, ur NEGATIVE NEGATIVE mg/dL   Protein, ur NEGATIVE NEGATIVE mg/dL   Nitrite NEGATIVE NEGATIVE  Leukocytes, UA NEGATIVE NEGATIVE   Ct Head Wo Contrast  Result Date: 04/27/2016 CLINICAL DATA:  Initial evaluation for balance issues. EXAM: CT HEAD WITHOUT CONTRAST TECHNIQUE: Contiguous axial images were obtained from the base of the skull through the vertex without intravenous contrast. COMPARISON:  Prior CT 10/22/2003. FINDINGS: Brain: Study limited by patient positioning. Atrophy with mild chronic small vessel ischemic disease. No acute intracranial hemorrhage. No evidence for acute large vessel territory infarct. No mass lesion, midline shift or mass effect. No hydrocephalus. No extra-axial fluid collection. Vascular: No hyperdense vessel. Skull: Scalp soft tissues demonstrate no acute abnormality. Calvarium intact. Sinuses/Orbits: Globes and orbital soft tissues within normal limits. Paranasal sinuses and mastoid air cells are clear. IMPRESSION: 1. No acute intracranial process. 2. Generalized age-related cerebral atrophy with mild chronic small vessel ischemic disease. Electronically Signed   By: Rise Mu M.D.   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   Follow up with urology in 7 days for voiding trial.All questions answered to patient's satisfaction. Based on history and exam patient has been appropriately medically screened and emergency conditions excluded. Patient is stable for discharge at this time. Strict return precautions given for  intractable vomiting, weakness, fever, no drainage from the catheter or any further problems or concerns  New Prescriptions New Prescriptions   No medications on file   I personally performed the services described in this documentation, which was scribed in my presence. The recorded information has been reviewed and is accurate.       Cy Blamer, MD 05/01/16 [id number]

## 2016-05-01 NOTE — ED Provider Notes (Signed)
[facility] Provider Note   CSN: [id number] Arrival date & time: 05/01/16  1350    By signing my name below, I, Freida Busman, attest that this documentation has been prepared under the direction and in the presence of Arthor Captain, PA-C. Electronically Signed: Freida Busman, Scribe. 05/01/2016. 4:37 PM.  History   Chief Complaint Chief Complaint  Patient presents with  . Hematuria    LEVEL 5 CAVEAT DUE TO MR   The history is provided by a caregiver. No language interpreter was used.    HPI Comments:  Antonio Montoya is a 66 y.o. male with a h/o MR and schizophrenia,  who presents to the Emergency Department with caregiver for hematuria beginning s/p foley catheterization last night. The cath was placed secondary to urinary retention.  His caregiver states the pt was trying to pull out the catheter PTA. Also notes pt has become increasingly more difficulty to care for in recent weeks. Caregiver states pt recently had a fit and broke several items including a glass ~ 3 days ago and has had swelling to the left hand today.   Past Medical History:  Diagnosis Date  . MR (mental retardation)   . Schizophrenia [facility])     Patient Active Problem List   Diagnosis Date Noted  . Pressure injury of skin 05/02/2016  . Acute delirium 05/01/2016    History reviewed. No pertinent surgical history.     Home Medications    Prior to Admission medications   Medication Sig Start Date End Date Taking? Authorizing Provider  benztropine (COGENTIN) 2 MG tablet Take 2 mg by mouth 2 (two) times daily.     Historical Provider, MD  ciprofloxacin (CIPRO) 500 MG tablet Take 500 mg by mouth 2 (two) times daily. 7 day course completed Friday 01/12/12   April Palumbo, MD  haloperidol (HALDOL) 5 MG tablet Take 25 mg by mouth at bedtime.    Historical Provider, MD  lamoTRIgine (LAMICTAL) 100 MG tablet Take 100 mg by mouth 2 (two) times daily. 125mg  (100mg  tab and 25mg  tab) in the morning  and 150mg  (100mg  tab and 2x 25mg  tab) in the evening    Historical Provider, MD  lamoTRIgine (LAMICTAL) 25 MG tablet Take 25-50 mg by mouth 2 (two) times daily. 125mg  (100mg  tab and 25mg  tab) in the morning and 150mg  (100mg  tab and 2x 25mg  tab) in the evening    Historical Provider, MD  omeprazole (PRILOSEC) 20 MG capsule Take 20 mg by mouth every morning.     Historical Provider, MD  Oxcarbazepine (TRILEPTAL) 300 MG tablet Take 300-600 mg by mouth 2 (two) times daily. 1 tablet in the morning and 2 in the evening    Historical Provider, MD  polycarbophil (FIBERCON) 625 MG tablet Take 625 mg by mouth every morning.     Historical Provider, MD  potassium chloride (K-DUR,KLOR-CON) 10 MEQ tablet Take 10 mEq by mouth every morning.    Historical Provider, MD  risperiDONE (RISPERDAL) 3 MG tablet Take 3 mg by mouth 2 (two) times daily.    Historical Provider, MD  tamsulosin (FLOMAX) 0.4 MG CAPS capsule Take 2 capsules (0.8 mg total) by mouth daily. 05/01/16   April Palumbo, MD  traZODone (DESYREL) 100 MG tablet Take 100 mg by mouth at bedtime.    Historical Provider, MD    Family History No family history on file.  Social History Social History  Substance Use Topics  . Smoking status: Never Smoker  . Smokeless tobacco: Never Used  .  Alcohol use No     Allergies   Patient has no known allergies.   Review of Systems Review of Systems  Reason unable to perform ROS: MR.     Physical Exam Updated Vital Signs BP 91/55   Pulse (!) 46   Temp (!) 95.7 F (35.4 C) (Rectal)   Resp 14   SpO2 (!) 88%   Physical Exam  Constitutional: He appears well-nourished. No distress.  HENT:  Head: Normocephalic.  Eyes: Conjunctivae are normal. Pupils are equal, round, and reactive to light.  Neck: Normal range of motion. Neck supple.  Cardiovascular: Normal rate.   Pulmonary/Chest: Effort normal.  Abdominal: He exhibits no distension.  Genitourinary:  Genitourinary Comments: Foley in place.  Chaperone (scribe) was present for exam which was performed with no discomfort or complications.   Musculoskeletal: Normal range of motion.  Neurological: He is alert.  Skin: Skin is warm and dry.  mulitple bruises in various states of healing over the patients body.  Left hand with swelling, erythema. Large cut in the webbing between the 1st and second fingers. Imaging shows no fractures or  Retained bodies  Nursing note and vitals reviewed.    ED Treatments / Results  DIAGNOSTIC STUDIES:  Oxygen Saturation is 100% on RA, normal by my interpretation.    4:39 PM Discussed plan with caregiver at bedside and he agreed.   Labs (all labs ordered are listed, but only abnormal results are displayed) Labs Reviewed  CBC WITH DIFFERENTIAL/PLATELET - Abnormal; Notable for the following:       Result Value   WBC 10.8 (*)    RBC 3.21 (*)    Hemoglobin 10.5 (*)    HCT 30.1 (*)    Platelets 78 (*)    Neutro Abs 9.9 (*)    Lymphs Abs 0.3 (*)    All other components within normal limits  BASIC METABOLIC PANEL - Abnormal; Notable for the following:    Glucose, Bld 64 (*)    BUN 44 (*)    Creatinine, Ser 1.41 (*)    GFR calc non Af Amer 51 (*)    GFR calc Af Amer 59 (*)    All other components within normal limits  CULTURE, BLOOD (ROUTINE X 2)  CULTURE, BLOOD (ROUTINE X 2)  AMMONIA  I-STAT CG4 LACTIC ACID, ED  I-STAT CG4 LACTIC ACID, ED  CBG MONITORING, ED    EKG  EKG Interpretation  Date/Time:  Tuesday May 01 2016 17:59:37 EST Ventricular Rate:  66 PR Interval:    QRS Duration: 110 QT Interval:  434 QTC Calculation: 455 R Axis:   54 Text Interpretation:  Sinus rhythm RSR' in V1 or V2, probably normal variant Borderline repolarization abnormality Confirmed by [facility] LLCAVILAND MD, JULIE ([id number]) on 05/02/2016 11:26:22 AM       Radiology Ct Head Wo Contrast  Result Date: 05/01/2016 CLINICAL DATA:  Patient is agitated with delirium. History of schizophrenia and mental  retardation. EXAM: CT HEAD WITHOUT CONTRAST TECHNIQUE: Contiguous axial images were obtained from the base of the skull through the vertex without intravenous contrast. COMPARISON:  04/27/2016. FINDINGS: Brain: No evidence of acute infarction, hemorrhage, hydrocephalus, extra-axial collection or mass lesion/mass effect. Vascular: No hyperdense vessel or unexpected calcification. Skull: Normal. Negative for fracture or focal lesion. Sinuses/Orbits: Globes orbits are unremarkable. Mild right maxillary sinus mucosal thickening. Sinuses otherwise essentially clear. Clear mastoid air cells. Other: None. IMPRESSION: 1. No acute intracranial abnormalities. No change from the prior head CT. Electronically Signed  By: Amie Portland M.D.   On: 05/01/2016 19:55   Dg Hand Complete Left  Result Date: 05/01/2016 CLINICAL DATA:  LT hand injury x 3 days ago with swelling on today. Caregiver states pt recently had a fit and broke several items including a glass. Pt has HX: Mental Retardation and Schizophrenia. Pt was combative prior to xray and was sedated in order to get xrays. 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 EXAM: LEFT HAND - COMPLETE 3+ VIEW COMPARISON:  None. FINDINGS: No fracture or dislocation. 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 There is narrowing of the radiocarpal joint between the radius and scaphoid, with mild subchondral sclerosis. Soft tissue swelling is noted most evident dorsally. No soft tissue air or radiopaque foreign body. IMPRESSION: 1. No acute fracture.  No dislocation. 2. 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 3. Nonspecific soft tissue swelling most evident dorsally. Electronically Signed   By: Amie Portland M.D.   On: 05/01/2016 18:14    Procedures Procedures (including critical care time)  Medications Ordered in ED Medications  tamsulosin  (FLOMAX) capsule 0.8 mg (0.8 mg Oral Given 05/03/16 0936)  lamoTRIgine (LAMICTAL) tablet 100 mg (100 mg Oral Given 05/03/16 2202)  pantoprazole (PROTONIX) EC tablet 40 mg (40 mg Oral Given 05/03/16 0937)  polycarbophil (FIBERCON) tablet 625 mg (625 mg Oral Given 05/03/16 0936)  potassium chloride (K-DUR,KLOR-CON) CR tablet 10 mEq (10 mEq Oral Given 05/03/16 0936)  traZODone (DESYREL) tablet 100 mg (100 mg Oral Given 05/03/16 2300)  acetaminophen (TYLENOL) tablet 650 mg (not administered)    Or  acetaminophen (TYLENOL) suppository 650 mg (not administered)  ondansetron (ZOFRAN) tablet 4 mg (not administered)    Or  ondansetron (ZOFRAN) injection 4 mg (not administered)  0.9 %  sodium chloride infusion ( Intravenous New Bag/Given 05/02/16 0858)  lamoTRIgine (LAMICTAL) tablet 50 mg (50 mg Oral Given 05/03/16 2201)  piperacillin-tazobactam (ZOSYN) IVPB 3.375 g (3.375 g Intravenous Given 05/04/16 0526)  LORazepam (ATIVAN) injection 1 mg (1 mg Intravenous Given 05/03/16 1805)  feeding supplement (ENSURE ENLIVE) (ENSURE ENLIVE) liquid 237 mL (237 mLs Oral Not Given 05/03/16 1930)  Oxcarbazepine (TRILEPTAL) tablet 300 mg (300 mg Oral Given 05/03/16 2200)  haloperidol (HALDOL) tablet 15 mg (15 mg Oral Given 05/03/16 2204)  benztropine (COGENTIN) tablet 1 mg (not administered)  LORazepam (ATIVAN) tablet 1 mg (1 mg Oral Given 05/03/16 2200)  ziprasidone (GEODON) injection 10 mg (10 mg Intramuscular Given 05/01/16 1650)  LORazepam (ATIVAN) injection 2 mg (2 mg Intramuscular Given 05/01/16 1650)  diphenhydrAMINE (BENADRYL) injection 50 mg (50 mg Intravenous Given 05/01/16 1650)  sodium chloride 0.9 % bolus 500 mL (0 mLs Intravenous Stopped 05/02/16 0010)  clindamycin (CLEOCIN) IVPB 600 mg (0 mg Intravenous Stopped 05/02/16 0010)  piperacillin-tazobactam (ZOSYN) IVPB 3.375 g (3.375 g Intravenous Given 05/02/16 0523)  vancomycin (VANCOCIN) IVPB 1000 mg/200 mL premix (1,000 mg Intravenous Given 05/02/16 0645)  haloperidol  lactate (HALDOL) injection 2 mg (2 mg Intravenous Given 05/02/16 0506)  haloperidol lactate (HALDOL) 5 MG/ML injection (  Duplicate 05/02/16 0515)  sodium chloride 0.9 % injection (10 mLs  Given by Other 05/03/16 1415)  iopamidol (ISOVUE-300) 61 % injection (  Contrast Given 05/03/16 1415)  iopamidol (ISOVUE-300) 61 % injection 100 mL (100 mLs Intravenous Contrast Given 05/03/16 1411)  dextrose 50 % solution (25 mLs  Given 05/04/16 9147)     Initial Impression / Assessment and Plan /  ED Course  I have reviewed the triage vital signs and the nursing notes.  Pertinent labs & imaging results that were available during my care of the patient were reviewed by me and considered in my medical decision making (see chart for details).  Clinical Course as of May 02 138  Wed May 02, 2016  0134 Patient was desaturating- I believe secondary to position and macroglossia- turned on side and 02 sats improved. During the course of his stay he has come to fit sepsis criteria. Blood cultures are now being taken after administration of clindamycin. Repeat lactic acid is fine. His rectal temp is 94. He will receive warm fluids and a bair hugger. His blossoming sepsis has occurred at the time of arrival of his transport. The patient is stable for transport.  [AH]    Clinical Course User Index [AH] Arthor Captain, PA-C    I personally performed the services described in this documentation, which was scribed in my presence. The recorded information has been reviewed and is accurate.     Final Clinical Impressions(s) / ED Diagnoses   Final diagnoses:  Altered mental status, unspecified altered mental status type  Agitation requiring sedation protocol  Cellulitis of hand  AKI (acute kidney injury) [facility])    New Prescriptions New Prescriptions   No medications on file      Arthor Captain, PA-C 05/04/16 0819    Linwood Dibbles, MD 05/07/16 [phone]

## 2016-05-01 NOTE — ED Triage Notes (Signed)
Was here recently with foley placed, patient tried to pull out foley and now has blood in foley.

## 2016-05-01 NOTE — Progress Notes (Signed)
[facility].  History of severe MR and schizophrenia.  Lives in a group home.  Has demonstrated agitation and combative behaviors recently, now exacerbated by placement of foley catheter last night for acute urinary retention.  Patient lives in a group home and has been brought back to the ED by caregivers because he cannot remain in the group home in this state.  ED attending to start IV clindamycin for a left hand cellulitis (xray does not show retained foreign body or acute injury).  Foley catheter remains in place.  U/A was normal last night.  Head CT also negative for acute abnormality.  Case discussed with Dr. Toniann FailKakrakandy prior to acceptance.  Will need Behavioral Health consult once medically cleared.

## 2016-05-02 ENCOUNTER — Encounter (HOSPITAL_COMMUNITY): Payer: Self-pay | Admitting: Internal Medicine

## 2016-05-02 ENCOUNTER — Inpatient Hospital Stay (HOSPITAL_COMMUNITY): Payer: Medicare Other

## 2016-05-02 ENCOUNTER — Observation Stay (HOSPITAL_BASED_OUTPATIENT_CLINIC_OR_DEPARTMENT_OTHER): Payer: Medicare Other

## 2016-05-02 DIAGNOSIS — A419 Sepsis, unspecified organism: Secondary | ICD-10-CM | POA: Diagnosis not present

## 2016-05-02 DIAGNOSIS — D649 Anemia, unspecified: Secondary | ICD-10-CM | POA: Diagnosis not present

## 2016-05-02 DIAGNOSIS — T68XXXA Hypothermia, initial encounter: Secondary | ICD-10-CM

## 2016-05-02 DIAGNOSIS — R410 Disorientation, unspecified: Secondary | ICD-10-CM | POA: Diagnosis not present

## 2016-05-02 DIAGNOSIS — L8942 Pressure ulcer of contiguous site of back, buttock and hip, stage 2: Secondary | ICD-10-CM

## 2016-05-02 DIAGNOSIS — R319 Hematuria, unspecified: Secondary | ICD-10-CM | POA: Diagnosis not present

## 2016-05-02 DIAGNOSIS — E876 Hypokalemia: Secondary | ICD-10-CM | POA: Diagnosis present

## 2016-05-02 DIAGNOSIS — R339 Retention of urine, unspecified: Secondary | ICD-10-CM | POA: Diagnosis not present

## 2016-05-02 DIAGNOSIS — N138 Other obstructive and reflux uropathy: Secondary | ICD-10-CM | POA: Diagnosis not present

## 2016-05-02 DIAGNOSIS — L8990 Pressure ulcer of unspecified site, unspecified stage: Secondary | ICD-10-CM | POA: Insufficient documentation

## 2016-05-02 DIAGNOSIS — R7989 Other specified abnormal findings of blood chemistry: Secondary | ICD-10-CM | POA: Diagnosis not present

## 2016-05-02 DIAGNOSIS — R451 Restlessness and agitation: Secondary | ICD-10-CM | POA: Diagnosis present

## 2016-05-02 DIAGNOSIS — L03119 Cellulitis of unspecified part of limb: Secondary | ICD-10-CM | POA: Diagnosis not present

## 2016-05-02 DIAGNOSIS — N401 Enlarged prostate with lower urinary tract symptoms: Secondary | ICD-10-CM | POA: Diagnosis not present

## 2016-05-02 DIAGNOSIS — R8271 Bacteriuria: Secondary | ICD-10-CM | POA: Diagnosis present

## 2016-05-02 DIAGNOSIS — R680 Hypothermia, not associated with low environmental temperature: Secondary | ICD-10-CM | POA: Diagnosis present

## 2016-05-02 DIAGNOSIS — Z781 Physical restraint status: Secondary | ICD-10-CM | POA: Diagnosis not present

## 2016-05-02 DIAGNOSIS — F209 Schizophrenia, unspecified: Secondary | ICD-10-CM | POA: Diagnosis not present

## 2016-05-02 DIAGNOSIS — F203 Undifferentiated schizophrenia: Secondary | ICD-10-CM | POA: Diagnosis not present

## 2016-05-02 DIAGNOSIS — E11649 Type 2 diabetes mellitus with hypoglycemia without coma: Secondary | ICD-10-CM | POA: Diagnosis not present

## 2016-05-02 DIAGNOSIS — S6982XA Other specified injuries of left wrist, hand and finger(s), initial encounter: Secondary | ICD-10-CM | POA: Diagnosis not present

## 2016-05-02 DIAGNOSIS — S06890S Other specified intracranial injury without loss of consciousness, sequela: Secondary | ICD-10-CM | POA: Diagnosis not present

## 2016-05-02 DIAGNOSIS — R001 Bradycardia, unspecified: Secondary | ICD-10-CM | POA: Diagnosis present

## 2016-05-02 DIAGNOSIS — R338 Other retention of urine: Secondary | ICD-10-CM | POA: Diagnosis not present

## 2016-05-02 DIAGNOSIS — N390 Urinary tract infection, site not specified: Secondary | ICD-10-CM | POA: Diagnosis not present

## 2016-05-02 DIAGNOSIS — L89322 Pressure ulcer of left buttock, stage 2: Secondary | ICD-10-CM | POA: Diagnosis not present

## 2016-05-02 DIAGNOSIS — X838XXA Intentional self-harm by other specified means, initial encounter: Secondary | ICD-10-CM | POA: Diagnosis not present

## 2016-05-02 DIAGNOSIS — R945 Abnormal results of liver function studies: Secondary | ICD-10-CM

## 2016-05-02 DIAGNOSIS — K219 Gastro-esophageal reflux disease without esophagitis: Secondary | ICD-10-CM | POA: Diagnosis not present

## 2016-05-02 DIAGNOSIS — D696 Thrombocytopenia, unspecified: Secondary | ICD-10-CM | POA: Diagnosis not present

## 2016-05-02 DIAGNOSIS — N179 Acute kidney failure, unspecified: Secondary | ICD-10-CM | POA: Diagnosis not present

## 2016-05-02 DIAGNOSIS — T43505A Adverse effect of unspecified antipsychotics and neuroleptics, initial encounter: Secondary | ICD-10-CM | POA: Diagnosis present

## 2016-05-02 DIAGNOSIS — F79 Unspecified intellectual disabilities: Secondary | ICD-10-CM | POA: Diagnosis present

## 2016-05-02 DIAGNOSIS — N281 Cyst of kidney, acquired: Secondary | ICD-10-CM | POA: Diagnosis present

## 2016-05-02 DIAGNOSIS — Z79899 Other long term (current) drug therapy: Secondary | ICD-10-CM | POA: Diagnosis not present

## 2016-05-02 LAB — COMPREHENSIVE METABOLIC PANEL
ALBUMIN: 3.1 g/dL — AB (ref 3.5–5.0)
ALBUMIN: 2.9 g/dL — AB (ref 3.5–5.0)
ALK PHOS: 92 U/L (ref 38–126)
ALT: 84 U/L — ABNORMAL HIGH (ref 17–63)
ALT: 77 U/L — ABNORMAL HIGH (ref 17–63)
ANION GAP: 5 (ref 5–15)
ANION GAP: 3 — AB (ref 5–15)
AST: 136 U/L — AB (ref 15–41)
AST: 133 U/L — ABNORMAL HIGH (ref 15–41)
Alkaline Phosphatase: 88 U/L (ref 38–126)
BILIRUBIN TOTAL: 0.6 mg/dL (ref 0.3–1.2)
BUN: 44 mg/dL — AB (ref 6–20)
BUN: 46 mg/dL — ABNORMAL HIGH (ref 6–20)
CALCIUM: 9.6 mg/dL (ref 8.9–10.3)
CHLORIDE: 111 mmol/L (ref 101–111)
CO2: 28 mmol/L (ref 22–32)
CO2: 29 mmol/L (ref 22–32)
Calcium: 9.3 mg/dL (ref 8.9–10.3)
Chloride: 110 mmol/L (ref 101–111)
Creatinine, Ser: 1.23 mg/dL (ref 0.61–1.24)
Creatinine, Ser: 1.39 mg/dL — ABNORMAL HIGH (ref 0.61–1.24)
GFR calc Af Amer: >60 mL/min (ref >60)
GFR calc non Af Amer: >60 mL/min (ref >60)
GFR calc non Af Amer: 52 mL/min — ABNORMAL LOW (ref >60)
GLUCOSE: 89 mg/dL (ref 65–99)
Glucose, Bld: 66 mg/dL (ref 65–99)
POTASSIUM: 4.1 mmol/L (ref 3.5–5.1)
Potassium: 4.2 mmol/L (ref 3.5–5.1)
SODIUM: 143 mmol/L (ref 135–145)
SODIUM: 143 mmol/L (ref 135–145)
TOTAL PROTEIN: 5.9 g/dL — AB (ref 6.5–8.1)
Total Bilirubin: 0.9 mg/dL (ref 0.3–1.2)
Total Protein: 5.6 g/dL — ABNORMAL LOW (ref 6.5–8.1)

## 2016-05-02 LAB — CBC WITH DIFFERENTIAL/PLATELET
BASOS ABS: 0 10*3/uL (ref 0.0–0.1)
BASOS PCT: 0 %
EOS ABS: 0 10*3/uL (ref 0.0–0.7)
Eosinophils Relative: 0 %
HEMATOCRIT: 31.2 % — AB (ref 39.0–52.0)
HEMOGLOBIN: 10.8 g/dL — AB (ref 13.0–17.0)
Lymphocytes Relative: 7 %
Lymphs Abs: 0.9 10*3/uL (ref 0.7–4.0)
MCH: 31.9 pg (ref 26.0–34.0)
MCHC: 34.6 g/dL (ref 30.0–36.0)
MCV: 92 fL (ref 78.0–100.0)
Monocytes Absolute: 0.5 10*3/uL (ref 0.1–1.0)
Monocytes Relative: 4 %
NEUTROS ABS: 11.4 10*3/uL — AB (ref 1.7–7.7)
NEUTROS PCT: 89 %
Platelets: 79 10*3/uL — ABNORMAL LOW (ref 150–400)
RBC: 3.39 MIL/uL — AB (ref 4.22–5.81)
RDW: 13.5 % (ref 11.5–15.5)
WBC: 12.8 10*3/uL — AB (ref 4.0–10.5)

## 2016-05-02 LAB — GLUCOSE, CAPILLARY
GLUCOSE-CAPILLARY: 82 mg/dL (ref 65–99)
Glucose-Capillary: 59 mg/dL — ABNORMAL LOW (ref 65–99)
Glucose-Capillary: 74 mg/dL (ref 65–99)
Glucose-Capillary: 79 mg/dL (ref 65–99)

## 2016-05-02 LAB — APTT: aPTT: 30 seconds (ref 24–36)

## 2016-05-02 LAB — BASIC METABOLIC PANEL
ANION GAP: 5 (ref 5–15)
BUN: 44 mg/dL — ABNORMAL HIGH (ref 6–20)
CALCIUM: 9.5 mg/dL (ref 8.9–10.3)
CO2: 30 mmol/L (ref 22–32)
CREATININE: 1.29 mg/dL — AB (ref 0.61–1.24)
Chloride: 109 mmol/L (ref 101–111)
GFR, EST NON AFRICAN AMERICAN: 57 mL/min — AB (ref >60)
GLUCOSE: 72 mg/dL (ref 65–99)
Potassium: 4.3 mmol/L (ref 3.5–5.1)
Sodium: 144 mmol/L (ref 135–145)

## 2016-05-02 LAB — PROTIME-INR
INR: 1.01
PROTHROMBIN TIME: 13.3 s (ref 11.4–15.2)

## 2016-05-02 LAB — MAGNESIUM: MAGNESIUM: 2.4 mg/dL (ref 1.7–2.4)

## 2016-05-02 LAB — PROCALCITONIN: Procalcitonin: 1.09 ng/mL

## 2016-05-02 LAB — I-STAT CG4 LACTIC ACID, ED: Lactic Acid, Venous: 0.81 mmol/L (ref 0.5–1.9)

## 2016-05-02 LAB — TSH: TSH: 1.788 u[IU]/mL (ref 0.350–4.500)

## 2016-05-02 LAB — MRSA PCR SCREENING: MRSA BY PCR: NEGATIVE

## 2016-05-02 LAB — LACTIC ACID, PLASMA: Lactic Acid, Venous: 1.2 mmol/L (ref 0.5–1.9)

## 2016-05-02 LAB — CBG MONITORING, ED: Glucose-Capillary: 74 mg/dL (ref 65–99)

## 2016-05-02 LAB — CORTISOL: CORTISOL PLASMA: 19.8 ug/dL

## 2016-05-02 MED ORDER — ONDANSETRON HCL 4 MG/2ML IJ SOLN: 4.0000 mg | Freq: Four times a day (QID) | INTRAMUSCULAR | Status: DC | PRN

## 2016-05-02 MED ORDER — LAMOTRIGINE 25 MG PO TABS
25.0000 mg | ORAL_TABLET | Freq: Every day | ORAL | Status: DC
  Administered 2016-05-02 – 2016-05-03 (×2): 25 mg via ORAL
  Filled 2016-05-02 (×2): qty 1

## 2016-05-02 MED ORDER — ENSURE ENLIVE PO LIQD
237.0000 mL | ORAL | Status: DC
  Administered 2016-05-05 – 2016-05-06 (×2): 237 mL via ORAL

## 2016-05-02 MED ORDER — VANCOMYCIN HCL IN DEXTROSE 1-5 GM/200ML-% IV SOLN
1000.0000 mg | Freq: Once | INTRAVENOUS | Status: AC
  Administered 2016-05-02: 1000 mg via INTRAVENOUS
  Filled 2016-05-02: qty 200

## 2016-05-02 MED ORDER — LAMOTRIGINE 100 MG PO TABS
100.0000 mg | ORAL_TABLET | Freq: Two times a day (BID) | ORAL | Status: DC
  Administered 2016-05-02 – 2016-05-24 (×41): 100 mg via ORAL
  Filled 2016-05-02 (×43): qty 1

## 2016-05-02 MED ORDER — HALOPERIDOL 5 MG PO TABS
25.0000 mg | ORAL_TABLET | Freq: Every day | ORAL | Status: DC
  Administered 2016-05-02: 25 mg via ORAL
  Filled 2016-05-02 (×2): qty 5

## 2016-05-02 MED ORDER — PIPERACILLIN-TAZOBACTAM 3.375 G IVPB 30 MIN
3.3750 g | Freq: Once | INTRAVENOUS | Status: AC
  Administered 2016-05-02: 3.375 g via INTRAVENOUS
  Filled 2016-05-02 (×2): qty 50

## 2016-05-02 MED ORDER — LORAZEPAM 2 MG/ML IJ SOLN
1.0000 mg | Freq: Four times a day (QID) | INTRAMUSCULAR | Status: DC | PRN
  Administered 2016-05-02 – 2016-05-16 (×25): 1 mg via INTRAVENOUS
  Filled 2016-05-02 (×30): qty 1

## 2016-05-02 MED ORDER — OXCARBAZEPINE 300 MG PO TABS
300.0000 mg | ORAL_TABLET | Freq: Every day | ORAL | Status: DC
  Filled 2016-05-02: qty 1

## 2016-05-02 MED ORDER — OXCARBAZEPINE 300 MG PO TABS: 600.0000 mg | ORAL_TABLET | Freq: Every day | ORAL | Status: DC

## 2016-05-02 MED ORDER — PIPERACILLIN-TAZOBACTAM 3.375 G IVPB
3.3750 g | Freq: Three times a day (TID) | INTRAVENOUS | Status: DC
  Administered 2016-05-02 – 2016-05-04 (×6): 3.375 g via INTRAVENOUS
  Filled 2016-05-02 (×6): qty 50

## 2016-05-02 MED ORDER — BENZTROPINE MESYLATE 0.5 MG PO TABS
2.0000 mg | ORAL_TABLET | Freq: Two times a day (BID) | ORAL | Status: DC
  Administered 2016-05-02: 2 mg via ORAL
  Filled 2016-05-02: qty 4

## 2016-05-02 MED ORDER — LAMOTRIGINE 25 MG PO TABS
50.0000 mg | ORAL_TABLET | Freq: Every day | ORAL | Status: DC
  Administered 2016-05-02 – 2016-05-04 (×3): 50 mg via ORAL
  Filled 2016-05-02 (×3): qty 2

## 2016-05-02 MED ORDER — ACETAMINOPHEN 650 MG RE SUPP: 650.0000 mg | Freq: Four times a day (QID) | RECTAL | Status: DC | PRN

## 2016-05-02 MED ORDER — ONDANSETRON HCL 4 MG PO TABS: 4.0000 mg | ORAL_TABLET | Freq: Four times a day (QID) | ORAL | Status: DC | PRN

## 2016-05-02 MED ORDER — CALCIUM POLYCARBOPHIL 625 MG PO TABS
625.0000 mg | ORAL_TABLET | Freq: Every morning | ORAL | Status: DC
  Administered 2016-05-02 – 2016-05-24 (×22): 625 mg via ORAL
  Filled 2016-05-02 (×23): qty 1

## 2016-05-02 MED ORDER — TRAZODONE HCL 50 MG PO TABS
100.0000 mg | ORAL_TABLET | Freq: Every day | ORAL | Status: DC
  Administered 2016-05-02 – 2016-05-03 (×2): 100 mg via ORAL
  Filled 2016-05-02 (×2): qty 2

## 2016-05-02 MED ORDER — VANCOMYCIN HCL IN DEXTROSE 750-5 MG/150ML-% IV SOLN
750.0000 mg | Freq: Two times a day (BID) | INTRAVENOUS | Status: DC
  Administered 2016-05-02: 750 mg via INTRAVENOUS
  Filled 2016-05-02 (×2): qty 150

## 2016-05-02 MED ORDER — POTASSIUM CHLORIDE CRYS ER 10 MEQ PO TBCR
10.0000 meq | EXTENDED_RELEASE_TABLET | Freq: Every morning | ORAL | Status: DC
  Administered 2016-05-02 – 2016-05-24 (×22): 10 meq via ORAL
  Filled 2016-05-02 (×22): qty 1

## 2016-05-02 MED ORDER — RISPERIDONE 1 MG PO TABS
3.0000 mg | ORAL_TABLET | Freq: Two times a day (BID) | ORAL | Status: DC
  Administered 2016-05-02: 3 mg via ORAL
  Filled 2016-05-02: qty 3

## 2016-05-02 MED ORDER — HALOPERIDOL LACTATE 5 MG/ML IJ SOLN
2.0000 mg | Freq: Once | INTRAMUSCULAR | Status: AC
  Administered 2016-05-02: 2 mg via INTRAVENOUS

## 2016-05-02 MED ORDER — PANTOPRAZOLE SODIUM 40 MG PO TBEC
40.0000 mg | DELAYED_RELEASE_TABLET | Freq: Every day | ORAL | Status: DC
  Administered 2016-05-02 – 2016-05-24 (×22): 40 mg via ORAL
  Filled 2016-05-02 (×22): qty 1

## 2016-05-02 MED ORDER — TAMSULOSIN HCL 0.4 MG PO CAPS
0.8000 mg | ORAL_CAPSULE | Freq: Every day | ORAL | Status: DC
  Administered 2016-05-02 – 2016-05-15 (×13): 0.8 mg via ORAL
  Filled 2016-05-02 (×14): qty 2

## 2016-05-02 MED ORDER — BENZTROPINE MESYLATE 0.5 MG PO TABS
1.0000 mg | ORAL_TABLET | Freq: Two times a day (BID) | ORAL | Status: DC
  Administered 2016-05-02 – 2016-05-03 (×2): 1 mg via ORAL
  Filled 2016-05-02 (×2): qty 2

## 2016-05-02 MED ORDER — ACETAMINOPHEN 325 MG PO TABS
650.0000 mg | ORAL_TABLET | Freq: Four times a day (QID) | ORAL | Status: DC | PRN
  Administered 2016-05-17 – 2016-05-18 (×2): 650 mg via ORAL
  Filled 2016-05-02 (×2): qty 2

## 2016-05-02 MED ORDER — RISPERIDONE 2 MG PO TBDP
2.0000 mg | ORAL_TABLET | Freq: Two times a day (BID) | ORAL | Status: DC | PRN
  Administered 2016-05-02 (×2): 2 mg via ORAL
  Filled 2016-05-02 (×3): qty 1

## 2016-05-02 MED ORDER — OXCARBAZEPINE 300 MG PO TABS
300.0000 mg | ORAL_TABLET | Freq: Two times a day (BID) | ORAL | Status: DC
  Administered 2016-05-02 – 2016-05-05 (×6): 300 mg via ORAL
  Filled 2016-05-02 (×6): qty 1

## 2016-05-02 MED ORDER — HALOPERIDOL LACTATE 5 MG/ML IJ SOLN
INTRAMUSCULAR | Status: AC
  Filled 2016-05-02: qty 1

## 2016-05-02 MED ORDER — SODIUM CHLORIDE 0.9 % IV SOLN
INTRAVENOUS | Status: AC
  Administered 2016-05-02 (×2): via INTRAVENOUS

## 2016-05-02 NOTE — Progress Notes (Signed)
Bladder scanned done and showed <2800ml. Patient is currently calm and resting. Will continue to monitor.

## 2016-05-02 NOTE — Progress Notes (Signed)
Pharmacy Antibiotic Note  Antonio Montoya is a 66 y.o. male c/o urinary retention admitted on 05/01/2016 with sepsis.  Pharmacy has been consulted for zosyn/vancomycin dosing.  Plan: Zosyn 3.375g IV q8h (4 hour infusion).  Vancomycin 1 Gm x1 then 750 mg IV q12h  VT=15-20 mg/L Daily Scr     Temp (24hrs), Avg:94.3 F (34.6 C), Min:93.3 F (34.1 C), Max:95.7 F (35.4 C)   Recent Labs Lab 04/27/16 2218 05/01/16 0020 05/01/16 1930 05/01/16 1942 05/02/16 0127  WBC 5.7  --  10.8*  --   --   CREATININE 1.28* 1.31* 1.41*  --   --   LATICACIDVEN  --   --   --  0.94 0.81    CrCl cannot be calculated (Unknown ideal weight.).    No Known Allergies  Antimicrobials this admission: 2/21 zosyn >>  2/21 vancomycin >>  2/21 clindamycin >> x1 ED Dose adjustments this admission:   Microbiology results:  BCx:   UCx:    Sputum:    MRSA PCR:   Thank you for allowing pharmacy to be a part of this patient's care.  Lorenza EvangelistGreen, Shimika Ames R 05/02/2016 4:24 AM

## 2016-05-02 NOTE — ED Notes (Signed)
Report called to Diannia RuderKara, [facility]

## 2016-05-02 NOTE — Care Management Note (Signed)
Case Management Note  Patient Details  Name: Antonio Montoya MRN: [id number] Date of Birth: 11/10/1950  Subjective/Objective:    Hypothermia and hypotension, phm of MR and Schizophrenia from group home.                Action/Plan: n being followed by Medicare Date:  May 02, 2016 Chart reviewed for concurrent status and case management needs. Will continue to follow patient progress. Discharge Planning: following for needs Expected discharge date: [id number] Marcelle SmilingRhonda Davis, BSN, [place]3, [place]   [phone]   Expected Discharge Date:                  Expected Discharge Plan:  Group Home  In-House Referral:  Clinical Social Work  Discharge planning Services     Post Acute Care Choice:    Choice offered to:     DME Arranged:    DME Agency:     HH Arranged:    HH Agency:     Status of Service:  Completed, signed off  If discussed at [employer] Meetings, dates discussed:    Additional Comments:  Golda AcreDavis, Rhonda Lynn, RN 05/02/2016, 9:33 AM

## 2016-05-02 NOTE — H&P (Addendum)
History and Physical    Antonio Montoya KGM:[id number] DOB: 1950-07-14 DOA: 05/01/2016  PCP: Shayne AlkenEGE,HILMI, MD (Inactive)  Patient coming from: Group home.  Chief Complaint: Agitation.  HPI: Antonio Montoya is a 66 y.o. male with mental retardation and schizophrenia had come to the ER 2 days ago for urinary retention and had Foley catheter placed and sent back to group home. Patient was found to be increasingly agitated. Patient also had hurt his left hand in trying to hit something. Patient was brought back to the ER because of increasing difficulty in managing with agitation. In the ER patient had to be sedated with Ativan.   ED Course: In the ER patient is found to have hypothermia and also because of the agitation was given Ativan. Since patient will need higher level of care patient will need to get psychiatry consult for his worsening mental status patient is being admitted. However since patient is having hypothermia with streaking of his both upper extremity concerning for cellulitis and swelling of the left hand patient will be admitted to medical service first. On my exam at the bedside in stepdown unit patient is presently sleeping after getting sedation. Both upper extremity has some streaking going up from both hands to the arms.  Review of Systems: As per HPI, rest all negative.   Past Medical History:  Diagnosis Date  . MR (mental retardation)   . Schizophrenia (HCC)     History reviewed. No pertinent surgical history.   reports that he has never smoked. He has never used smokeless tobacco. He reports that he does not drink alcohol or use drugs.  No Known Allergies  Family History  Problem Relation Age of Onset  . Family history unknown: Yes    Prior to Admission medications   Medication Sig Start Date End Date Taking? Authorizing Provider  benztropine (COGENTIN) 2 MG tablet Take 2 mg by mouth 2 (two) times daily.     Historical Provider, MD  ciprofloxacin (CIPRO) 500  MG tablet Take 500 mg by mouth 2 (two) times daily. 7 day course completed Friday 01/12/12   April Palumbo, MD  haloperidol (HALDOL) 5 MG tablet Take 25 mg by mouth at bedtime.    Historical Provider, MD  lamoTRIgine (LAMICTAL) 100 MG tablet Take 100 mg by mouth 2 (two) times daily. 125mg  (100mg  tab and 25mg  tab) in the morning and 150mg  (100mg  tab and 2x 25mg  tab) in the evening    Historical Provider, MD  lamoTRIgine (LAMICTAL) 25 MG tablet Take 25-50 mg by mouth 2 (two) times daily. 125mg  (100mg  tab and 25mg  tab) in the morning and 150mg  (100mg  tab and 2x 25mg  tab) in the evening    Historical Provider, MD  omeprazole (PRILOSEC) 20 MG capsule Take 20 mg by mouth every morning.     Historical Provider, MD  Oxcarbazepine (TRILEPTAL) 300 MG tablet Take 300-600 mg by mouth 2 (two) times daily. 1 tablet in the morning and 2 in the evening    Historical Provider, MD  polycarbophil (FIBERCON) 625 MG tablet Take 625 mg by mouth every morning.     Historical Provider, MD  potassium chloride (K-DUR,KLOR-CON) 10 MEQ tablet Take 10 mEq by mouth every morning.    Historical Provider, MD  risperiDONE (RISPERDAL) 3 MG tablet Take 3 mg by mouth 2 (two) times daily.    Historical Provider, MD  tamsulosin (FLOMAX) 0.4 MG CAPS capsule Take 2 capsules (0.8 mg total) by mouth daily. 05/01/16   April Palumbo, MD  traZODone (DESYREL) 100 MG tablet Take 100 mg by mouth at bedtime.    Historical Provider, MD    Physical Exam: Vitals:   05/02/16 0030 05/02/16 0130 05/02/16 0149 05/02/16 0300  BP: 91/55 (!) 89/54  104/71  Pulse: (!) 46 (!) 45  (!) 56  Resp: 14 15  14   Temp:   (!) 94 F (34.4 C) (!) 93.3 F (34.1 C)  TempSrc:   Rectal Rectal  SpO2: (!) 88% 96%  100%      Constitutional: Moderately built and nourished. Vitals:   05/02/16 0030 05/02/16 0130 05/02/16 0149 05/02/16 0300  BP: 91/55 (!) 89/54  104/71  Pulse: (!) 46 (!) 45  (!) 56  Resp: 14 15  14   Temp:   (!) 94 F (34.4 C) (!) 93.3 F (34.1 C)    TempSrc:   Rectal Rectal  SpO2: (!) 88% 96%  100%   Eyes: Anicteric no pallor. ENMT: No discharge from the ears eyes nose and mouth. Neck: No mass felt. No neck rigidity. Respiratory: No rhonchi or crepitations. Cardiovascular: S1-S2 heard no murmurs appreciated. Abdomen: Soft nontender bowel sounds present. Musculoskeletal: Erythematous streaking of the both upper extremities. Left hand swollen. Skin: Erythematous streaking of the both upper extremities. Neurologic: Patient is sedated at this time. Not able to do full neurological assessment. Psychiatric: Patient is sleepy at this time after receiving sedation.   Labs on Admission: I have personally reviewed following labs and imaging studies  CBC:  Recent Labs Lab 04/27/16 2218 05/01/16 1930  WBC 5.7 10.8*  NEUTROABS 4.7 9.9*  HGB 12.1* 10.5*  HCT 34.3* 30.1*  MCV 93.7 93.8  PLT 132* 78*   Basic Metabolic Panel:  Recent Labs Lab 04/27/16 2218 05/01/16 0020 05/01/16 1930  NA 143 138 142  K 4.8 4.3 3.9  CL 107 99* 105  CO2 32 30 31  GLUCOSE 78 100* 64*  BUN 31* 36* 44*  CREATININE 1.28* 1.31* 1.41*  CALCIUM 9.9 10.2 9.5   GFR: CrCl cannot be calculated (Unknown ideal weight.). Liver Function Tests: No results for input(s): AST, ALT, ALKPHOS, BILITOT, PROT, ALBUMIN in the last 168 hours. No results for input(s): LIPASE, AMYLASE in the last 168 hours.  Recent Labs Lab 05/01/16 1930  AMMONIA 24   Coagulation Profile: No results for input(s): INR, PROTIME in the last 168 hours. Cardiac Enzymes: No results for input(s): CKTOTAL, CKMB, CKMBINDEX, TROPONINI in the last 168 hours. BNP (last 3 results) No results for input(s): PROBNP in the last 8760 hours. HbA1C: No results for input(s): HGBA1C in the last 72 hours. CBG:  Recent Labs Lab 05/02/16 0109  GLUCAP 74   Lipid Profile: No results for input(s): CHOL, HDL, LDLCALC, TRIG, CHOLHDL, LDLDIRECT in the last 72 hours. Thyroid Function Tests: No  results for input(s): TSH, T4TOTAL, FREET4, T3FREE, THYROIDAB in the last 72 hours. Anemia Panel: No results for input(s): VITAMINB12, FOLATE, FERRITIN, TIBC, IRON, RETICCTPCT in the last 72 hours. Urine analysis:    Component Value Date/Time   COLORURINE YELLOW 05/01/2016 0025   APPEARANCEUR CLOUDY (A) 05/01/2016 0025   LABSPEC 1.015 05/01/2016 0025   PHURINE 7.0 05/01/2016 0025   GLUCOSEU NEGATIVE 05/01/2016 0025   HGBUR NEGATIVE 05/01/2016 0025   BILIRUBINUR NEGATIVE 05/01/2016 0025   KETONESUR NEGATIVE 05/01/2016 0025   PROTEINUR NEGATIVE 05/01/2016 0025   UROBILINOGEN 1.0 01/16/2012 1957   NITRITE NEGATIVE 05/01/2016 0025   LEUKOCYTESUR NEGATIVE 05/01/2016 0025   Sepsis Labs: @LABRCNTIP (procalcitonin:4,lacticidven:4) )No results found for this or  any previous visit (from the past 240 hour(s)).   Radiological Exams on Admission: Ct Head Wo Contrast  Result Date: 05/01/2016 CLINICAL DATA:  Patient is agitated with delirium. History of schizophrenia and mental retardation. EXAM: CT HEAD WITHOUT CONTRAST TECHNIQUE: Contiguous axial images were obtained from the base of the skull through the vertex without intravenous contrast. COMPARISON:  04/27/2016. FINDINGS: Brain: No evidence of acute infarction, hemorrhage, hydrocephalus, extra-axial collection or mass lesion/mass effect. Vascular: No hyperdense vessel or unexpected calcification. Skull: Normal. Negative for fracture or focal lesion. Sinuses/Orbits: Globes orbits are unremarkable. Mild right maxillary sinus mucosal thickening. Sinuses otherwise essentially clear. Clear mastoid air cells. Other: None. IMPRESSION: 1. No acute intracranial abnormalities. No change from the prior head CT. Electronically Signed   By: Amie Portland M.D.   On: 05/01/2016 19:55   Dg Hand Complete Left  Result Date: 05/01/2016 CLINICAL DATA:  LT hand injury x 3 days ago with swelling on today. Caregiver states pt recently had a fit and broke several items  including a glass. Pt has HX: Mental Retardation and Schizophrenia. Pt was combative prior to xray and was sedated in order to get xrays. 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 EXAM: LEFT HAND - COMPLETE 3+ VIEW COMPARISON:  None. FINDINGS: No fracture or dislocation. 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 There is narrowing of the radiocarpal joint between the radius and scaphoid, with mild subchondral sclerosis. Soft tissue swelling is noted most evident dorsally. No soft tissue air or radiopaque foreign body. IMPRESSION: 1. No acute fracture.  No dislocation. 2. 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 3. Nonspecific soft tissue swelling most evident dorsally. Electronically Signed   By: Amie Portland M.D.   On: 05/01/2016 18:14     Assessment/Plan Principal Problem:   Acute delirium Active Problems:   Pressure injury of skin   Cellulitis of hand   Hypothermia    1. Acute delirium - cause not clear. Could be due to the insertion of Foley catheter for urinary retention. Patient also was hypothermic not sure if patient is getting septic. Patient did receive 1 dose of Ativan. Patient is mildly bradycardic and respiratory rate is around 12-14/m. If patient becomes more agitated will give when necessary Haldol. Consult psychiatrist in a.m. 2. Hypothermia with cellulitis of the upper extremities concerning for developing sepsis - for now I have ordered blood cultures lactate procalcitonin levels TSH cortisol levels. I have placed patient on empiric antibiotics. X-ray of the left hand does not show any fracture. Patient is on warming blankets. 3. Schizophrenia - will try to continue home medication if patient is able to swallow. 4. Urinary retention with Foley catheter placement - will need urology follow-up. 5. Thrombocytopenia - patient has been  thrombocytopenic previously. Closely follow CBC for any further worsening.   DVT prophylaxis: SCDs. Code Status: Full code.  Family Communication: No family at the bedside.  Disposition Plan: Back to group home if patient becomes more alert and awake.  Consults called: None.  Admission status: Inpatient.    Eduard Clos MD [facility] Pager [phone].  If 7PM-7AM, please contact night-coverage [email or web address] Password [email or web address]  05/02/2016, 4:08 AM

## 2016-05-02 NOTE — Progress Notes (Signed)
Patient has had multiple episodes of agitation and agressive behavior throughout day shift.  Patient kicks, attempts to hit head on bedrails and repeatly tries to get out of bed.  It takes the  Clinical research associatewriter and staff x 2 to calm and comfort patient.  Patient would benefit from a actual sitter when awake.

## 2016-05-02 NOTE — Consult Note (Signed)
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 Thurmond Butts Patient Identification: Antonio Montoya MRN:  [id number] Principal Diagnosis: Acute delirium Diagnosis:   Patient Active Problem List   Diagnosis Date Noted  . Pressure injury of skin [L89.90] 05/02/2016  . Cellulitis of hand [F74.944] 05/02/2016  . Hypothermia [T68.XXXA] 05/02/2016  . Acute delirium [R41.0] 05/01/2016    Total Time spent with patient: 1 hour  Subjective:   Antonio Montoya is a 66 y.o. male patient admitted with urinary retention and agitation.  HPI:  Antonio Montoya is a 66 y.o. male, seen, chart reviewed. Patient is a resident of group home with mental retardation and schizophrenia, Admitted to [facility] intensive care unit for increased symptoms of agitation secondary to urinary retention and required Foley catheterization. Patient is a resident of group home and has no contact with staff from the group home and family members. Patient is currently lying down on his bed and seems to be sedated with his medication. Patient is seen, chart reviewed and case discussed with the staff RN. Review of medication indicated patient has been taking Haldol 25 mg at bedtime, risperidone M tablets 2 mg twice daily, immediate release 3 mg 3 times daily, trazodone 100 mg at bedtime, lamotrigine 100 mg twice daily and also 25 mg twice daily. Patient was not able to provide any history during this evaluation. I have tried to contact his sister on phone without sepsis today.  Medical history: Patient is a 66 years old malehad come to the ER 2 days ago for urinary retention and had Foley catheter placed and sent back to group home. Patient was found to be increasingly agitated. Patient also had hurt his left hand in trying to hit something. Patient was brought back to the ER because of increasing difficulty in managing with agitation. In the ER  patient had to be sedated with Ativan.   ED Course: In the ER patient is found to have hypothermia and also because of the agitation was given Ativan. Since patient will need higher level of care patient will need to get psychiatry consult for his worsening mental status patient is being admitted. However since patient is having hypothermia with streaking of his both upper extremity concerning for cellulitis and swelling of the left hand patient will be admitted to medical service first. On my exam at the bedside in stepdown unit patient is presently sleeping after getting sedation. Both upper extremity has some streaking going up from both hands to the arms.  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 MR (mental retardation)   . Schizophrenia ([place])    History reviewed. No pertinent surgical history. Family History:  Family History  Problem Relation Age of Onset  . Family history unknown: Yes   Family Psychiatric  History: Noncontributory Social History:  History  Alcohol Use No     History  Drug Use No    Social History   Social History  . Marital status: Single    Spouse name: N/A  . Number of children: N/A  . Years of education: N/A   Social History Main Topics  . Smoking status: Never Smoker  . Smokeless tobacco: Never Used  .  Alcohol use No  . Drug use: No  . Sexual activity: No   Other Topics Concern  . None   Social History Narrative  . None   Additional Social History:    Allergies:  No Known Allergies  Labs:  Results for orders placed or performed during the hospital encounter of 05/01/16 (from the past 48 hour(s))  CBC with Differential/Platelet     Status: Abnormal   Collection Time: 05/01/16  7:30 PM  Result Value Ref Range   WBC 10.8 (H) 4.0 - 10.5  K/uL   RBC 3.21 (L) 4.22 - 5.81 MIL/uL   Hemoglobin 10.5 (L) 13.0 - 17.0 g/dL   HCT 30.1 (L) 39.0 - 52.0 %   MCV 93.8 78.0 - 100.0 fL   MCH 32.7 26.0 - 34.0 pg   MCHC 34.9 30.0 - 36.0 g/dL   RDW 12.6 11.5 - 15.5 %   Platelets 78 (L) 150 - 400 K/uL    Comment: PLATELET COUNT CONFIRMED BY SMEAR SPECIMEN CHECKED FOR CLOTS REPEATED TO VERIFY    Neutrophils Relative % 88 %   Lymphocytes Relative 3 %   Monocytes Relative 6 %   Eosinophils Relative 0 %   Basophils Relative 0 %   Band Neutrophils 3 %   Neutro Abs 9.9 (H) 1.7 - 7.7 K/uL   Lymphs Abs 0.3 (L) 0.7 - 4.0 K/uL   Monocytes Absolute 0.6 0.1 - 1.0 K/uL   Eosinophils Absolute 0.0 0.0 - 0.7 K/uL   Basophils Absolute 0.0 0.0 - 0.1 K/uL   Smear Review PLATELET COUNT CONFIRMED BY SMEAR   Ammonia     Status: None   Collection Time: 05/01/16  7:30 PM  Result Value Ref Range   Ammonia 24 9 - 35 umol/L  Basic metabolic panel     Status: Abnormal   Collection Time: 05/01/16  7:30 PM  Result Value Ref Range   Sodium 142 135 - 145 mmol/L   Potassium 3.9 3.5 - 5.1 mmol/L   Chloride 105 101 - 111 mmol/L   CO2 31 22 - 32 mmol/L   Glucose, Bld 64 (L) 65 - 99 mg/dL   BUN 44 (H) 6 - 20 mg/dL   Creatinine, Ser 1.41 (H) 0.61 - 1.24 mg/dL   Calcium 9.5 8.9 - 10.3 mg/dL   GFR calc non Af Amer 51 (L) >60 mL/min   GFR calc Af Amer 59 (L) >60 mL/min    Comment: (NOTE) The eGFR has been calculated using the CKD EPI equation. This calculation has not been validated in all clinical situations. eGFR's persistently <60 mL/min signify possible Chronic Kidney Disease.    Anion gap 6 5 - 15  I-Stat CG4 Lactic Acid, ED     Status: None   Collection Time: 05/01/16  7:42 PM  Result Value Ref Range   Lactic Acid, Venous 0.94 0.5 - 1.9 mmol/L  CBG monitoring, ED     Status: None   Collection Time: 05/02/16  1:09 AM  Result Value Ref Range   Glucose-Capillary 74 65 - 99 mg/dL  I-Stat CG4 Lactic Acid, ED     Status: None   Collection Time: 05/02/16   1:27 AM  Result Value Ref Range   Lactic Acid, Venous 0.81 0.5 - 1.9 mmol/L  MRSA PCR Screening     Status: None   Collection Time: 05/02/16  3:08 AM  Result Value Ref Range   MRSA by PCR NEGATIVE NEGATIVE    Comment:  The GeneXpert MRSA Assay (FDA approved for NASAL specimens only), is one component of a comprehensive MRSA colonization surveillance program. It is not intended to diagnose MRSA infection nor to guide or monitor treatment for MRSA infections.   CBC with Differential     Status: Abnormal   Collection Time: 05/02/16  8:19 AM  Result Value Ref Range   WBC 12.8 (H) 4.0 - 10.5 K/uL   RBC 3.39 (L) 4.22 - 5.81 MIL/uL   Hemoglobin 10.8 (L) 13.0 - 17.0 g/dL   HCT 31.2 (L) 39.0 - 52.0 %   MCV 92.0 78.0 - 100.0 fL   MCH 31.9 26.0 - 34.0 pg   MCHC 34.6 30.0 - 36.0 g/dL   RDW 13.5 11.5 - 15.5 %   Platelets 79 (L) 150 - 400 K/uL    Comment: SPECIMEN CHECKED FOR CLOTS REPEATED TO VERIFY PLATELET COUNT CONFIRMED BY SMEAR    Neutrophils Relative % 89 %   Neutro Abs 11.4 (H) 1.7 - 7.7 K/uL   Lymphocytes Relative 7 %   Lymphs Abs 0.9 0.7 - 4.0 K/uL   Monocytes Relative 4 %   Monocytes Absolute 0.5 0.1 - 1.0 K/uL   Eosinophils Relative 0 %   Eosinophils Absolute 0.0 0.0 - 0.7 K/uL   Basophils Relative 0 %   Basophils Absolute 0.0 0.0 - 0.1 K/uL  Comprehensive metabolic panel     Status: Abnormal   Collection Time: 05/02/16  8:19 AM  Result Value Ref Range   Sodium 143 135 - 145 mmol/L   Potassium 4.2 3.5 - 5.1 mmol/L   Chloride 110 101 - 111 mmol/L   CO2 28 22 - 32 mmol/L   Glucose, Bld 66 65 - 99 mg/dL   BUN 44 (H) 6 - 20 mg/dL   Creatinine, Ser 1.23 0.61 - 1.24 mg/dL   Calcium 9.6 8.9 - 10.3 mg/dL   Total Protein 5.9 (L) 6.5 - 8.1 g/dL   Albumin 3.1 (L) 3.5 - 5.0 g/dL   AST 136 (H) 15 - 41 U/L   ALT 84 (H) 17 - 63 U/L   Alkaline Phosphatase 92 38 - 126 U/L   Total Bilirubin 0.6 0.3 - 1.2 mg/dL   GFR calc non Af Amer >60 >60 mL/min   GFR calc Af Amer  >60 >60 mL/min    Comment: (NOTE) The eGFR has been calculated using the CKD EPI equation. This calculation has not been validated in all clinical situations. eGFR's persistently <60 mL/min signify possible Chronic Kidney Disease.    Anion gap 5 5 - 15  Lactic acid, plasma     Status: None   Collection Time: 05/02/16  8:19 AM  Result Value Ref Range   Lactic Acid, Venous 1.2 0.5 - 1.9 mmol/L  Procalcitonin     Status: None   Collection Time: 05/02/16  8:19 AM  Result Value Ref Range   Procalcitonin 1.09 ng/mL    Comment:        Interpretation: PCT > 0.5 ng/mL and <= 2 ng/mL: Systemic infection (sepsis) is possible, but other conditions are known to elevate PCT as well. (NOTE)         ICU PCT Algorithm               Non ICU PCT Algorithm    ----------------------------     ------------------------------         PCT < 0.25 ng/mL  PCT < 0.1 ng/mL     Stopping of antibiotics            Stopping of antibiotics       strongly encouraged.               strongly encouraged.    ----------------------------     ------------------------------       PCT level decrease by               PCT < 0.25 ng/mL       >= 80% from peak PCT       OR PCT 0.25 - 0.5 ng/mL          Stopping of antibiotics                                             encouraged.     Stopping of antibiotics           encouraged.    ----------------------------     ------------------------------       PCT level decrease by              PCT >= 0.25 ng/mL       < 80% from peak PCT        AND PCT >= 0.5 ng/mL             Continuing antibiotics                                              encouraged.       Continuing antibiotics            encouraged.    ----------------------------     ------------------------------     PCT level increase compared          PCT > 0.5 ng/mL         with peak PCT AND          PCT >= 0.5 ng/mL             Escalation of antibiotics                                           strongly encouraged.      Escalation of antibiotics        strongly encouraged.   Protime-INR     Status: None   Collection Time: 05/02/16  8:19 AM  Result Value Ref Range   Prothrombin Time 13.3 11.4 - 15.2 seconds   INR 1.01   APTT     Status: None   Collection Time: 05/02/16  8:19 AM  Result Value Ref Range   aPTT 30 24 - 36 seconds  Magnesium     Status: None   Collection Time: 05/02/16  8:19 AM  Result Value Ref Range   Magnesium 2.4 1.7 - 2.4 mg/dL  TSH     Status: None   Collection Time: 05/02/16  8:19 AM  Result Value Ref Range   TSH 1.788 0.350 - 4.500 uIU/mL    Comment: Performed by a 3rd Generation assay with a functional sensitivity of <=0.01 uIU/mL.  Cortisol  Status: None   Collection Time: 05/02/16  8:19 AM  Result Value Ref Range   Cortisol, Plasma 19.8 ug/dL    Comment: (NOTE) AM    6.7 - 22.6 ug/dL PM   <10.0       ug/dL Performed at [facility] [address] [place], [place] [address]   Basic metabolic panel     Status: Abnormal   Collection Time: 05/02/16  8:19 AM  Result Value Ref Range   Sodium 144 135 - 145 mmol/L   Potassium 4.3 3.5 - 5.1 mmol/L   Chloride 109 101 - 111 mmol/L   CO2 30 22 - 32 mmol/L   Glucose, Bld 72 65 - 99 mg/dL   BUN 44 (H) 6 - 20 mg/dL   Creatinine, Ser 1.29 (H) 0.61 - 1.24 mg/dL   Calcium 9.5 8.9 - 10.3 mg/dL   GFR calc non Af Amer 57 (L) >60 mL/min   GFR calc Af Amer >60 >60 mL/min    Comment: (NOTE) The eGFR has been calculated using the CKD EPI equation. This calculation has not been validated in all clinical situations. eGFR's persistently <60 mL/min signify possible Chronic Kidney Disease.    Anion gap 5 5 - 15  Glucose, capillary     Status: Abnormal   Collection Time: 05/02/16  8:29 AM  Result Value Ref Range   Glucose-Capillary 59 (L) 65 - 99 mg/dL   Comment 1 Notify RN    Comment 2 Document in Chart   Glucose, capillary     Status: None   Collection Time: 05/02/16  9:13 AM  Result Value  Ref Range   Glucose-Capillary 74 65 - 99 mg/dL  Glucose, capillary     Status: None   Collection Time: 05/02/16 12:21 PM  Result Value Ref Range   Glucose-Capillary 79 65 - 99 mg/dL   Comment 1 Notify RN    Comment 2 Document in Chart     Current Facility-Administered Medications  Medication Dose Route Frequency Provider Last Rate Last Dose  . 0.9 %  sodium chloride infusion   Intravenous Continuous Rise Patience, MD 100 mL/hr at 05/02/16 0858    . acetaminophen (TYLENOL) tablet 650 mg  650 mg Oral Q6H PRN Rise Patience, MD       Or  . acetaminophen (TYLENOL) suppository 650 mg  650 mg Rectal Q6H PRN Rise Patience, MD      . benztropine (COGENTIN) tablet 1 mg  1 mg Oral BID Debbe Odea, MD      . feeding supplement (ENSURE ENLIVE) (ENSURE ENLIVE) liquid 237 mL  237 mL Oral Q24H [name], MD      . haloperidol (HALDOL) tablet 25 mg  25 mg Oral QHS Rise Patience, MD      . lamoTRIgine (LAMICTAL) tablet 100 mg  100 mg Oral BID Rise Patience, MD   100 mg at 05/02/16 5809  . lamoTRIgine (LAMICTAL) tablet 25 mg  25 mg Oral Daily Rise Patience, MD   25 mg at 05/02/16 9833  . lamoTRIgine (LAMICTAL) tablet 50 mg  50 mg Oral QHS Rise Patience, MD      . LORazepam (ATIVAN) injection 1 mg  1 mg Intravenous Q6H PRN Debbe Odea, MD      . ondansetron (ZOFRAN) tablet 4 mg  4 mg Oral Q6H PRN Rise Patience, MD       Or  . ondansetron [facility]) injection 4 mg  4 mg  Intravenous Q6H PRN Rise Patience, MD      . Oxcarbazepine (TRILEPTAL) tablet 300 mg  300 mg Oral BID Debbe Odea, MD      . pantoprazole (PROTONIX) EC tablet 40 mg  40 mg Oral Daily Rise Patience, MD   40 mg at 05/02/16 8563  . piperacillin-tazobactam (ZOSYN) IVPB 3.375 g  3.375 g Intravenous Q8H Dorrene German, RPH      . polycarbophil (FIBERCON) tablet 625 mg  625 mg Oral q morning - 10a Rise Patience, MD   625 mg at 05/02/16 1497  . potassium chloride  (K-DUR,KLOR-CON) CR tablet 10 mEq  10 mEq Oral q morning - 10a Rise Patience, MD   10 mEq at 05/02/16 0263  . risperiDONE (RISPERDAL M-TABS) disintegrating tablet 2 mg  2 mg Oral BID PRN Debbe Odea, MD   2 mg at 05/02/16 1406  . tamsulosin (FLOMAX) capsule 0.8 mg  0.8 mg Oral Daily Rise Patience, MD   0.8 mg at 05/02/16 0926  . traZODone (DESYREL) tablet 100 mg  100 mg Oral QHS Rise Patience, MD      . vancomycin (VANCOCIN) IVPB 750 mg/150 ml premix  750 mg Intravenous Q12H Dorrene German, [facility]        Musculoskeletal: Strength & Muscle Tone: decreased Gait & Station: unable to stand Patient leans: N/A  Psychiatric Specialty Exam: Physical Exam  Skin: Rash noted.    ROS unable to obtain secondary to altered mental status and excessive sedation.   Blood pressure (!) 93/47, pulse 69, temperature 97.3 F (36.3 C), temperature source Axillary, resp. rate 15, height 6' (1.829 m), weight 71 kg (156 lb 8.4 oz), SpO2 (!) 1 %.Body mass index is 21.23 kg/m.  General Appearance: Casual  Eye Contact:  NA  Speech:  NA  Volume:  na  Mood:  NA  Affect:  NA  Thought Process:  NA  Orientation:  NA  Thought Content:  NA  Suicidal Thoughts:  na  Homicidal Thoughts:  NA  Memory:  NA  Judgement:  NA  Insight:  NA  Psychomotor Activity:  NA  Concentration:  NA  Recall:  NA  Fund of Knowledge:  NA  Language:  NA  Akathisia:  NA  Handed:  NA  AIMS (if indicated):     Assets:  Others:  tbd  ADL's:  Impaired  Cognition:  NA  Sleep:        Treatment Plan Summary: 66 years old male with the known history of mental retardation and schizophrenia and a resident of group home presented with the increased agitation secondary to urinary retention. Patient needed medication to sedate him or calm him down.  Patient has been taking higher dose of antipsychotic medication Haldol and risperidone along with the mood stabilizers Trileptal and Lamictal.  Case discussed with unit LCSW  to contact the group home, patient family members and possibly primary providers regarding his medication management changes'  Urinary retention can be a common side effect of the antipsychotic anticholinergic effects.   Patient needed to be monitored for the EKG for possible QTC prolongation.  If patient urinary retention not resolved, his antipsychotic medication may need to be reduced which need to be balanced with the increased agitation and possible aggressive behaviors  Patient has legal guardian   Patient will be followed up tomorrow for the venous assessment if he is able to stay awake and communicate with [place].  Daily contact with patient to assess  and evaluate symptoms and progress in treatment and Medication management   Appreciate psychiatric consultation and follow up as clinically required Please contact [phone] or [phone] if needs further assistance  Disposition: Supportive therapy provided about ongoing stressors.  Ambrose Finland, MD 05/02/2016 2:18 PM

## 2016-05-02 NOTE — Progress Notes (Signed)
eLink Physician-Brief Progress Note Patient Name: Antonio PiggsCharlie Montoya DOB: June 22, 1950 MRN: [id number]   Date of Service  05/02/2016  HPI/Events of Note  Delirium - Not PCCM patient. Hx of MR. Head CT Scan negative. QTc interval = 0.434.  eICU Interventions  Will order: 1. Haldol 2 mg IV X 1 now. 2. Further management per primary team.      Intervention Category Major Interventions: Delirium, psychosis, severe agitation - evaluation and management  Bethanny Toelle Eugene 05/02/2016, 5:02 AM

## 2016-05-02 NOTE — Progress Notes (Addendum)
PROGRESS NOTE    Antonio Montoya   WUJ:[id number]  DOB: [birth year]  DOA: 05/01/2016 PCP: Shayne AlkenEGE,HILMI, MD (Inactive)   Brief Narrative:  Antonio Montoya is a 66 y.o. male with DM diet controlled, mental retardation and schizophrenia had come to the ER 2 days ago for urinary retention (bladder scan showed > 1 L urine and caretaker stated he had not urinated in 18 hrs) and had Foley catheter placed and sent back to group home.  Patient is unable to give history- per ER HPI, he came if in for hematuria after pulling on his foley cath. He has beem increasingly agitated for a few wks per caregiver. Caregiver stated pt recently had a fit and broke several items including a glass ~ 3 days prior to admission & had swelling to the left hand on the day of admission . P  In the ER patient had to be sedated with Ativan. He was found to have hypothermia with temp of 95.7.  This is the 5th visit to the ER since Jan of this year.  1/21- stumbled, fell, abrasion to forehead 2/4- left subconjunctival hemorrhage 2/16- off balance- head CT ok  2/16- U retention  Subjective: Confused - does not answer questions.   Assessment & Plan:   Principal Problem:   Acute delirium - agitation may be increased from baseline due to foley cath- he is on numerous antipsychotics and now IV Ativan is being given- his group home does not want him back -as the foley may be the cause of his increased agitation,  I have asked RN to pull foley and check to see if he urinates - - have checked with RN this afternoon- despite pulling foley, behavior has not changed- behavior goes from severe agitation to sleeping- PRN Ativan and Risperdal being used - psych consulted for assistance - follow  QTc  Active Problems:   Pressure injury of skin - left buttock- stage 2  Swelling of hand - xray does not show fracture - swelling due to traumatic injury (based on H and P) vs cellulitis- vs gout??  --  given Clinda last night and zosyn  & vanc this AM -  Continue for now     Hypothermia - due to sepsis ? - even if he has cellulitis of left hand, it is not significant enough to cause hypothermia - blood cultures negative, UA negative - agree with checking cortisol and thyroid functions which are normal - suspect poor thermoregulation due to antipsychotics-   Elevated LFTs - obtain CT abd/pelvis to look for source of infection- if no infection, then likely secondary to antipsychotics  Thrombocytopenia - noted on 2/16 and has progressed- medication related??   U retention - cont Flomax- foley taken out  - < 200 cc in bladder thus far- RN to continue to follow- I and O cath PRN - cont IVF to ensure he has good urine output- close I and O   DVT prophylaxis: SCDs Code Status: Full code Family Communication:  Disposition Plan: follow in SDU due to hypothermia Consultants:   psych Procedures:    Antimicrobials:  Anti-infectives    Start     Dose/Rate Route Frequency Ordered Stop   05/02/16 2000  vancomycin (VANCOCIN) IVPB 750 mg/150 ml premix     750 mg 150 mL/hr over 60 Minutes Intravenous Every 12 hours 05/02/16 0643     05/02/16 1400  piperacillin-tazobactam (ZOSYN) IVPB 3.375 g     3.375 g 12.5 mL/hr over 240 Minutes Intravenous Every  8 hours 05/02/16 0532     05/02/16 0415  piperacillin-tazobactam (ZOSYN) IVPB 3.375 g     3.375 g 100 mL/hr over 30 Minutes Intravenous  Once 05/02/16 0407 05/02/16 0553   05/02/16 0415  vancomycin (VANCOCIN) IVPB 1000 mg/200 mL premix     1,000 mg 200 mL/hr over 60 Minutes Intravenous  Once 05/02/16 0407 05/02/16 0745   05/01/16 2200  clindamycin (CLEOCIN) IVPB 600 mg     600 mg 100 mL/hr over 30 Minutes Intravenous  Once 05/01/16 2148 05/02/16 0010       Objective: Vitals:   05/02/16 0841 05/02/16 0926 05/02/16 1100 05/02/16 1200  BP: (!) 117/97 (!) 94/48 (!) 93/47   Pulse:      Resp: 14 17 15    Temp: (!) 96.1 F (35.6 C)   97.3 F (36.3 C)  TempSrc: Rectal    Axillary  SpO2: 99% 100% (!) 1%   Weight:      Height:        Intake/Output Summary (Last 24 hours) at 05/02/16 1549 Last data filed at 05/02/16 1030  Gross per 24 hour  Intake              610 ml  Output              950 ml  Net             -340 ml   Filed Weights   05/02/16 0640  Weight: 71 kg (156 lb 8.4 oz)    Examination: General exam: Appears restless, nonverbal, does not follow commands HEENT: PERRLA, oral mucosa moist, no sclera icterus or thrush Respiratory system: Clear to auscultation. Respiratory effort normal. Cardiovascular system: S1 & S2 heard, RRR.  No murmurs  Gastrointestinal system: Abdomen soft, non-tender, nondistended. Normal bowel sound. No organomegaly Central nervous system: Alert- moving all extermities   Extremities: No cyanosis, clubbing or edema Skin: No rashes or ulcers Psychiatry:  restless    Data Reviewed: I have personally reviewed following labs and imaging studies  CBC:  Recent Labs Lab 04/27/16 2218 05/01/16 1930 05/02/16 0819  WBC 5.7 10.8* 12.8*  NEUTROABS 4.7 9.9* 11.4*  HGB 12.1* 10.5* 10.8*  HCT 34.3* 30.1* 31.2*  MCV 93.7 93.8 92.0  PLT 132* 78* 79*   Basic Metabolic Panel:  Recent Labs Lab 04/27/16 2218 05/01/16 0020 05/01/16 1930 05/02/16 0819  NA 143 138 142 143  144  K 4.8 4.3 3.9 4.2  4.3  CL 107 99* 105 110  109  CO2 32 30 31 28  30   GLUCOSE 78 100* 64* 66  72  BUN 31* 36* 44* 44*  44*  CREATININE 1.28* 1.31* 1.41* 1.23  1.29*  CALCIUM 9.9 10.2 9.5 9.6  9.5  MG  --   --   --  2.4   GFR: Estimated Creatinine Clearance: 60.9 mL/min (by C-G formula based on SCr of 1.23 mg/dL). Liver Function Tests:  Recent Labs Lab 05/02/16 0819  AST 136*  ALT 84*  ALKPHOS 92  BILITOT 0.6  PROT 5.9*  ALBUMIN 3.1*   No results for input(s): LIPASE, AMYLASE in the last 168 hours.  Recent Labs Lab 05/01/16 1930  AMMONIA 24   Coagulation Profile:  Recent Labs Lab 05/02/16 0819  INR 1.01    Cardiac Enzymes: No results for input(s): CKTOTAL, CKMB, CKMBINDEX, TROPONINI in the last 168 hours. BNP (last 3 results) No results for input(s): PROBNP in the last 8760 hours. HbA1C: No results for input(s): HGBA1C  in the last 72 hours. CBG:  Recent Labs Lab 05/02/16 0109 05/02/16 0829 05/02/16 0913 05/02/16 1221  GLUCAP 74 59* 74 79   Lipid Profile: No results for input(s): CHOL, HDL, LDLCALC, TRIG, CHOLHDL, LDLDIRECT in the last 72 hours. Thyroid Function Tests:  Recent Labs  05/02/16 0819  TSH 1.788   Anemia Panel: No results for input(s): VITAMINB12, FOLATE, FERRITIN, TIBC, IRON, RETICCTPCT in the last 72 hours. Urine analysis:    Component Value Date/Time   COLORURINE YELLOW 05/01/2016 0025   APPEARANCEUR CLOUDY (A) 05/01/2016 0025   LABSPEC 1.015 05/01/2016 0025   PHURINE 7.0 05/01/2016 0025   GLUCOSEU NEGATIVE 05/01/2016 0025   HGBUR NEGATIVE 05/01/2016 0025   BILIRUBINUR NEGATIVE 05/01/2016 0025   KETONESUR NEGATIVE 05/01/2016 0025   PROTEINUR NEGATIVE 05/01/2016 0025   UROBILINOGEN 1.0 01/16/2012 1957   NITRITE NEGATIVE 05/01/2016 0025   LEUKOCYTESUR NEGATIVE 05/01/2016 0025   Sepsis Labs: @LABRCNTIP (procalcitonin:4,lacticidven:4) ) Recent Results (from the past 240 hour(s))  MRSA PCR Screening     Status: None   Collection Time: 05/02/16  3:08 AM  Result Value Ref Range Status   MRSA by PCR NEGATIVE NEGATIVE Final    Comment:        The GeneXpert MRSA Assay (FDA approved for NASAL specimens only), is one component of a comprehensive MRSA colonization surveillance program. It is not intended to diagnose MRSA infection nor to guide or monitor treatment for MRSA infections.          Radiology Studies: Ct Head Wo Contrast  Result Date: 05/01/2016 CLINICAL DATA:  Patient is agitated with delirium. History of schizophrenia and mental retardation. EXAM: CT HEAD WITHOUT CONTRAST TECHNIQUE: Contiguous axial images were obtained from the  base of the skull through the vertex without intravenous contrast. COMPARISON:  04/27/2016. FINDINGS: Brain: No evidence of acute infarction, hemorrhage, hydrocephalus, extra-axial collection or mass lesion/mass effect. Vascular: No hyperdense vessel or unexpected calcification. Skull: Normal. Negative for fracture or focal lesion. Sinuses/Orbits: Globes orbits are unremarkable. Mild right maxillary sinus mucosal thickening. Sinuses otherwise essentially clear. Clear mastoid air cells. Other: None. IMPRESSION: 1. No acute intracranial abnormalities. No change from the prior head CT. Electronically Signed   By: Amie Portland M.D.   On: 05/01/2016 19:55   Dg Chest Port 1 View  Result Date: 05/02/2016 CLINICAL DATA:  Sepsis EXAM: PORTABLE CHEST 1 VIEW COMPARISON:  Chest radiograph 06/17/2011 FINDINGS: Cardiac silhouette is enlarged mildly. There is no focal airspace consolidation or pulmonary edema. No pneumothorax or sizable pleural effusion. IMPRESSION: No focal airspace disease. Electronically Signed   By: Deatra Robinson M.D.   On: 05/02/2016 04:48   Dg Hand Complete Left  Result Date: 05/01/2016 CLINICAL DATA:  LT hand injury x 3 days ago with swelling on today. Caregiver states pt recently had a fit and broke several items including a glass. Pt has HX: Mental Retardation and Schizophrenia. Pt was combative prior to xray and was sedated in order to get xrays. 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 EXAM: LEFT HAND - COMPLETE 3+ VIEW COMPARISON:  None. FINDINGS: No fracture or dislocation. 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 There is narrowing of the radiocarpal joint between the radius and scaphoid, with mild subchondral sclerosis. Soft tissue swelling is noted most evident dorsally. No soft tissue air or radiopaque foreign body. IMPRESSION: 1. No acute fracture.  No dislocation. 2. 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 3. Nonspecific soft tissue swelling most evident dorsally. Electronically Signed   By: Amie Portland M.D.   On: 05/01/2016 18:14      Scheduled Meds: . benztropine  1 mg Oral BID  . feeding supplement (ENSURE ENLIVE)  237 mL Oral Q24H  . haloperidol  25 mg Oral QHS  . lamoTRIgine  100 mg Oral BID  . lamoTRIgine  25 mg Oral Daily  . lamoTRIgine  50 mg Oral QHS  . Oxcarbazepine  300 mg Oral BID  . pantoprazole  40 mg Oral Daily  . piperacillin-tazobactam (ZOSYN)  IV  3.375 g Intravenous Q8H  . polycarbophil  625 mg Oral q morning - 10a  . potassium chloride  10 mEq Oral q morning - 10a  . tamsulosin  0.8 mg Oral Daily  . traZODone  100 mg Oral QHS  . vancomycin  750 mg Intravenous Q12H   Continuous Infusions: . sodium chloride 100 mL/hr at 05/02/16 0858     LOS: 0 days    Time spent in minutes: 35    Thadius Smisek, MD [facility] Pager: [email or web address] Password [email or web address] 05/02/2016, 3:49 PM

## 2016-05-02 NOTE — Progress Notes (Signed)
Initial Nutrition Assessment  DOCUMENTATION CODES:   Not applicable  INTERVENTION:  - Will order Ensure Enlive once/day to aid in meeting estimated protein need. This supplement provides 350 kcal and 20 grams of protein. - Will change diet order from Regular to Soft as pt has no teeth/dentures. - Continue to encourage PO intakes and assist with meals as needed.  NUTRITION DIAGNOSIS:   Biting/chewing difficulty related to other (see comment) (no teeth or dentures) as evidenced by other (see comment) (RN report).  GOAL:   Patient will meet greater than or equal to 90% of their needs  MONITOR:   PO intake, Supplement acceptance, Weight trends, Labs, Skin, I & O's  REASON FOR ASSESSMENT:   Low Braden  ASSESSMENT:   66 y.o. male with mental retardation and schizophrenia had come to the ER 2 days ago for urinary retention and had Foley catheter placed and sent back to group home. Patient was found to be increasingly agitated. Patient also had hurt his left hand in trying to hit something. Patient was brought back to the ER because of increasing difficulty in managing with agitation. In the ER patient had to be sedated with Ativan.   Pt seen for low Braden. BMI indicates normal weight. Pt sleeping at time of visit with no family/visitors present. Spoke with RN who reports pt unable to having a meaningful conversation. She reports he ate scrambled eggs, applesauce, and drank juice for breakfast; he was unable to eat the bacon d/t having no teeth or dentures. Lunch tray arrived while RD was in the room performing physical assessment; RD informed RN of this.   Physical assessment limited to upper body only as pt is on Bair Hugger at this time. No muscle or fat wasting noted to upper body. No weight hx available from PTA.   Medications reviewed; 40 mg oral Protonix/day, 625 mg Fibercon/day, 10 mEq oral KCl/day. Labs reviewed; BUN: 44 mg/dL, creatinine: 4.091.29 mg/dL, AST/ALT elevated.   IVF:  NS @ 100 mL/hr.    Diet Order:  Diet regular Room service appropriate? Yes; Fluid consistency: Thin  Skin:  Wound (see comment) (Stage 2 R hip pressure injury)  Last BM:  PTA/unknown  Height:   Ht Readings from Last 1 Encounters:  05/02/16 6' (1.829 m)    Weight:   Wt Readings from Last 1 Encounters:  05/02/16 156 lb 8.4 oz (71 kg)    Ideal Body Weight:  80.91 kg  BMI:  Body mass index is 21.23 kg/m.  Estimated Nutritional Needs:   Kcal:  1630-1845 (23-26 kcal/kg)  Protein:  70-85 grams (1-1.2 grams/kg)  Fluid:     EDUCATION NEEDS:   No education needs identified at this time    Trenton GammonJessica Navie Lamoreaux, MS, RD, LDN, CNSC Inpatient Clinical Dietitian Pager # [phone] After hours/weekend pager # [phone]

## 2016-05-03 ENCOUNTER — Inpatient Hospital Stay (HOSPITAL_COMMUNITY): Payer: Medicare Other

## 2016-05-03 ENCOUNTER — Encounter (HOSPITAL_COMMUNITY): Payer: Self-pay | Admitting: Radiology

## 2016-05-03 LAB — BASIC METABOLIC PANEL
Anion gap: 5 (ref 5–15)
BUN: 38 mg/dL — AB (ref 6–20)
CALCIUM: 9.5 mg/dL (ref 8.9–10.3)
CO2: 29 mmol/L (ref 22–32)
CREATININE: 1.3 mg/dL — AB (ref 0.61–1.24)
Chloride: 112 mmol/L — ABNORMAL HIGH (ref 101–111)
GFR calc non Af Amer: 56 mL/min — ABNORMAL LOW (ref >60)
Glucose, Bld: 80 mg/dL (ref 65–99)
Potassium: 3.8 mmol/L (ref 3.5–5.1)
SODIUM: 146 mmol/L — AB (ref 135–145)

## 2016-05-03 LAB — COMPREHENSIVE METABOLIC PANEL
ALBUMIN: 2.8 g/dL — AB (ref 3.5–5.0)
ALT: 69 U/L — ABNORMAL HIGH (ref 17–63)
ANION GAP: 5 (ref 5–15)
AST: 107 U/L — ABNORMAL HIGH (ref 15–41)
Alkaline Phosphatase: 80 U/L (ref 38–126)
BILIRUBIN TOTAL: 1.5 mg/dL — AB (ref 0.3–1.2)
BUN: 35 mg/dL — ABNORMAL HIGH (ref 6–20)
CHLORIDE: 113 mmol/L — AB (ref 101–111)
CO2: 26 mmol/L (ref 22–32)
Calcium: 9.2 mg/dL (ref 8.9–10.3)
Creatinine, Ser: 1.24 mg/dL (ref 0.61–1.24)
GFR calc Af Amer: >60 mL/min (ref >60)
GFR, EST NON AFRICAN AMERICAN: 60 mL/min — AB (ref >60)
Glucose, Bld: 71 mg/dL (ref 65–99)
POTASSIUM: 3.8 mmol/L (ref 3.5–5.1)
Sodium: 144 mmol/L (ref 135–145)
TOTAL PROTEIN: 5.5 g/dL — AB (ref 6.5–8.1)

## 2016-05-03 LAB — CBC
HEMATOCRIT: 27.9 % — AB (ref 39.0–52.0)
Hemoglobin: 9.6 g/dL — ABNORMAL LOW (ref 13.0–17.0)
MCH: 31.5 pg (ref 26.0–34.0)
MCHC: 34.4 g/dL (ref 30.0–36.0)
MCV: 91.5 fL (ref 78.0–100.0)
PLATELETS: 61 10*3/uL — AB (ref 150–400)
RBC: 3.05 MIL/uL — ABNORMAL LOW (ref 4.22–5.81)
RDW: 13.6 % (ref 11.5–15.5)
WBC: 9.5 10*3/uL (ref 4.0–10.5)

## 2016-05-03 LAB — HIV ANTIBODY (ROUTINE TESTING W REFLEX): HIV Screen 4th Generation wRfx: NONREACTIVE

## 2016-05-03 MED ORDER — HALOPERIDOL 5 MG PO TABS
15.0000 mg | ORAL_TABLET | Freq: Every day | ORAL | Status: DC
  Administered 2016-05-03 – 2016-05-04 (×2): 15 mg via ORAL
  Filled 2016-05-03 (×2): qty 3

## 2016-05-03 MED ORDER — SODIUM CHLORIDE 0.9 % IJ SOLN
INTRAMUSCULAR | Status: AC
  Administered 2016-05-03: 10 mL
  Filled 2016-05-03: qty 50

## 2016-05-03 MED ORDER — IOPAMIDOL (ISOVUE-300) INJECTION 61%
100.0000 mL | Freq: Once | INTRAVENOUS | Status: AC | PRN
  Administered 2016-05-03: 100 mL via INTRAVENOUS

## 2016-05-03 MED ORDER — BENZTROPINE MESYLATE 0.5 MG PO TABS
1.0000 mg | ORAL_TABLET | Freq: Every day | ORAL | Status: DC
  Administered 2016-05-04 – 2016-05-05 (×2): 1 mg via ORAL
  Filled 2016-05-03 (×2): qty 2

## 2016-05-03 MED ORDER — LORAZEPAM 1 MG PO TABS
1.0000 mg | ORAL_TABLET | Freq: Every day | ORAL | Status: DC
  Administered 2016-05-03 – 2016-05-04 (×2): 1 mg via ORAL
  Filled 2016-05-03 (×2): qty 1

## 2016-05-03 MED ORDER — IOPAMIDOL (ISOVUE-300) INJECTION 61%
INTRAVENOUS | Status: AC
  Administered 2016-05-03: 14:00:00
  Filled 2016-05-03: qty 100

## 2016-05-03 NOTE — Progress Notes (Addendum)
PROGRESS NOTE    Antonio Montoya   ZOX:[id number]  DOB: 01/22/1951  DOA: 05/01/2016 PCP: Shayne Alken, MD (Inactive)   Brief Narrative:  Antonio Montoya is a 66 y.o. male with DM diet controlled, mental retardation and schizophrenia had come to the ER 2 days ago for urinary retention (bladder scan showed > 1 L urine and caretaker stated he had not urinated in 18 hrs) and had Foley catheter placed and sent back to group home.  Patient is unable to give history- per ER HPI, he came if in for hematuria after pulling on his foley cath. He has beem increasingly agitated for a few wks per caregiver. Caregiver stated pt recently had a fit and broke several items including a glass ~ 3 days prior to admission & had swelling to the left hand on the day of admission .  In the ER patient had to be sedated with Ativan. He was found to have hypothermia with temp of 95.7.  This is the 5th visit to the ER since Jan of this year.  1/21- stumbled, fell, abrasion to forehead 2/4- left subconjunctival hemorrhage 2/16- off balance- head CT ok  2/16- U retention  Subjective: Sedated.   Assessment & Plan:   Principal Problem:   Acute delirium - agitation may be increased from baseline due to foley cath- he is on numerous antipsychotics and now IV Ativan is being given- his group home does not want him back -as the foley may be the cause of his increased agitation,  I asked RN to pull foley  - agitation did not improve- - behavior goes from severe agitation to sleeping- PRN Ativan and Risperdal being used - psych consulted for assistance - following QTc  Active Problems:   Pressure injury of skin - left buttock- stage 2  Swelling of hand - xray does not show fracture - swelling due to traumatic injury (based on H and P) vs cellulitis- vs gout??  --  given Clinda last in ER and then zosyn & vanc - cultures negative-  Stop Vanc today    Hypothermia - due to sepsis ? - even if he has cellulitis of left  hand, it is not significant enough to cause hypothermia - blood cultures negative, UA negative - agree with checking cortisol and thyroid functions which are normal - suspect poor thermoregulation due to antipsychotics- have spoken with psych about this  Elevated LFTs -  CT abd shows no signs of infection- he has a mild CBD dilatation- doubt this is significant - suspect LFTs are elevated due to psych medications  Thrombocytopenia - noted on 2/16 and has progressed- medication related?? Dicussed with psych   U retention - cont Flomax- foley taken out but continue to retain and foley replaced. - per psych, his psych meds may be causing this- medications changes to be made by Dr Sheryle Spray   DVT prophylaxis: SCDs Code Status: Full code Family Communication:  Disposition Plan: follow in SDU due to hypothermia Consultants:   psych Procedures:    Antimicrobials:  Anti-infectives    Start     Dose/Rate Route Frequency Ordered Stop   05/02/16 2000  vancomycin (VANCOCIN) IVPB 750 mg/150 ml premix  Status:  Discontinued     750 mg 150 mL/hr over 60 Minutes Intravenous Every 12 hours 05/02/16 0643 05/03/16 0805   05/02/16 1400  piperacillin-tazobactam (ZOSYN) IVPB 3.375 g     3.375 g 12.5 mL/hr over 240 Minutes Intravenous Every 8 hours 05/02/16 0532  05/02/16 0415  piperacillin-tazobactam (ZOSYN) IVPB 3.375 g     3.375 g 100 mL/hr over 30 Minutes Intravenous  Once 05/02/16 0407 05/02/16 0553   05/02/16 0415  vancomycin (VANCOCIN) IVPB 1000 mg/200 mL premix     1,000 mg 200 mL/hr over 60 Minutes Intravenous  Once 05/02/16 0407 05/02/16 0745   05/01/16 2200  clindamycin (CLEOCIN) IVPB 600 mg     600 mg 100 mL/hr over 30 Minutes Intravenous  Once 05/01/16 2148 05/02/16 0010       Objective: Vitals:   05/03/16 0800 05/03/16 0900 05/03/16 0945 05/03/16 1000  BP: 135/69   135/71  Pulse:  (!) 46  (!) 57  Resp: (!) 23 (!) 21  16  Temp:   (!) 94.3 F (34.6 C)   TempSrc:    Rectal   SpO2: 98% 97%  94%  Weight:      Height:        Intake/Output Summary (Last 24 hours) at 05/03/16 1317 Last data filed at 05/03/16 0517  Gross per 24 hour  Intake             1560 ml  Output                0 ml  Net             1560 ml   Filed Weights   05/02/16 0640  Weight: 71 kg (156 lb 8.4 oz)    Examination: General exam: sedated this AM, nonverbal, does not follow commands HEENT: PERRLA, oral mucosa moist, no sclera icterus or thrush Respiratory system: Clear to auscultation. Respiratory effort normal. Cardiovascular system: S1 & S2 heard, RRR.  No murmurs  Gastrointestinal system: Abdomen soft, non-tender, nondistended. Normal bowel sound. No organomegaly Central nervous system: sedated this AM Extremities: No cyanosis, clubbing- swelling of left hand improving Skin: No rashes or ulcers Psychiatry: sedated    Data Reviewed: I have personally reviewed following labs and imaging studies  CBC:  Recent Labs Lab 04/27/16 2218 05/01/16 1930 05/02/16 0819 05/03/16 0848  WBC 5.7 10.8* 12.8* 9.5  NEUTROABS 4.7 9.9* 11.4*  --   HGB 12.1* 10.5* 10.8* 9.6*  HCT 34.3* 30.1* 31.2* 27.9*  MCV 93.7 93.8 92.0 91.5  PLT 132* 78* 79* 61*   Basic Metabolic Panel:  Recent Labs Lab 05/01/16 0020 05/01/16 1930 05/02/16 0819 05/02/16 1854 05/03/16 0848  NA 138 142 143  144 143 146*  K 4.3 3.9 4.2  4.3 4.1 3.8  CL 99* 105 110  109 111 112*  CO2 30 31 28  30 29 29   GLUCOSE 100* 64* 66  72 89 80  BUN 36* 44* 44*  44* 46* 38*  CREATININE 1.31* 1.41* 1.23  1.29* 1.39* 1.30*  CALCIUM 10.2 9.5 9.6  9.5 9.3 9.5  MG  --   --  2.4  --   --    GFR: Estimated Creatinine Clearance: 57.6 mL/min (by C-G formula based on SCr of 1.3 mg/dL (H)). Liver Function Tests:  Recent Labs Lab 05/02/16 0819 05/02/16 1854  AST 136* 133*  ALT 84* 77*  ALKPHOS 92 88  BILITOT 0.6 0.9  PROT 5.9* 5.6*  ALBUMIN 3.1* 2.9*   No results for input(s): LIPASE, AMYLASE in the  last 168 hours.  Recent Labs Lab 05/01/16 1930  AMMONIA 24   Coagulation Profile:  Recent Labs Lab 05/02/16 0819  INR 1.01   Cardiac Enzymes: No results for input(s): CKTOTAL, CKMB, CKMBINDEX, TROPONINI in the last  168 hours. BNP (last 3 results) No results for input(s): PROBNP in the last 8760 hours. HbA1C: No results for input(s): HGBA1C in the last 72 hours. CBG:  Recent Labs Lab 05/02/16 0109 05/02/16 0829 05/02/16 0913 05/02/16 1221 05/02/16 1756  GLUCAP 74 59* 74 79 82   Lipid Profile: No results for input(s): CHOL, HDL, LDLCALC, TRIG, CHOLHDL, LDLDIRECT in the last 72 hours. Thyroid Function Tests:  Recent Labs  05/02/16 0819  TSH 1.788   Anemia Panel: No results for input(s): VITAMINB12, FOLATE, FERRITIN, TIBC, IRON, RETICCTPCT in the last 72 hours. Urine analysis:    Component Value Date/Time   COLORURINE YELLOW 05/01/2016 0025   APPEARANCEUR CLOUDY (A) 05/01/2016 0025   LABSPEC 1.015 05/01/2016 0025   PHURINE 7.0 05/01/2016 0025   GLUCOSEU NEGATIVE 05/01/2016 0025   HGBUR NEGATIVE 05/01/2016 0025   BILIRUBINUR NEGATIVE 05/01/2016 0025   KETONESUR NEGATIVE 05/01/2016 0025   PROTEINUR NEGATIVE 05/01/2016 0025   UROBILINOGEN 1.0 01/16/2012 1957   NITRITE NEGATIVE 05/01/2016 0025   LEUKOCYTESUR NEGATIVE 05/01/2016 0025   Sepsis Labs: @LABRCNTIP (procalcitonin:4,lacticidven:4) ) Recent Results (from the past 240 hour(s))  MRSA PCR Screening     Status: None   Collection Time: 05/02/16  3:08 AM  Result Value Ref Range Status   MRSA by PCR NEGATIVE NEGATIVE Final    Comment:        The GeneXpert MRSA Assay (FDA approved for NASAL specimens only), is one component of a comprehensive MRSA colonization surveillance program. It is not intended to diagnose MRSA infection nor to guide or monitor treatment for MRSA infections.          Radiology Studies: Ct Head Wo Contrast  Result Date: 05/01/2016 CLINICAL DATA:  Patient is agitated  with delirium. History of schizophrenia and mental retardation. EXAM: CT HEAD WITHOUT CONTRAST TECHNIQUE: Contiguous axial images were obtained from the base of the skull through the vertex without intravenous contrast. COMPARISON:  04/27/2016. FINDINGS: Brain: No evidence of acute infarction, hemorrhage, hydrocephalus, extra-axial collection or mass lesion/mass effect. Vascular: No hyperdense vessel or unexpected calcification. Skull: Normal. Negative for fracture or focal lesion. Sinuses/Orbits: Globes orbits are unremarkable. Mild right maxillary sinus mucosal thickening. Sinuses otherwise essentially clear. Clear mastoid air cells. Other: None. IMPRESSION: 1. No acute intracranial abnormalities. No change from the prior head CT. Electronically Signed   By: Amie Portland M.D.   On: 05/01/2016 19:55   Dg Chest Port 1 View  Result Date: 05/02/2016 CLINICAL DATA:  Sepsis EXAM: PORTABLE CHEST 1 VIEW COMPARISON:  Chest radiograph 06/17/2011 FINDINGS: Cardiac silhouette is enlarged mildly. There is no focal airspace consolidation or pulmonary edema. No pneumothorax or sizable pleural effusion. IMPRESSION: No focal airspace disease. Electronically Signed   By: Deatra Robinson M.D.   On: 05/02/2016 04:48   Dg Hand Complete Left  Result Date: 05/01/2016 CLINICAL DATA:  LT hand injury x 3 days ago with swelling on today. Caregiver states pt recently had a fit and broke several items including a glass. Pt has HX: Mental Retardation and Schizophrenia. Pt was combative prior to xray and was sedated in order to get xrays. 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 EXAM: LEFT HAND - COMPLETE 3+ VIEW COMPARISON:  None. FINDINGS: No fracture or dislocation. 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 There is narrowing of the radiocarpal joint between the radius and scaphoid, with mild subchondral sclerosis. Soft tissue swelling is  noted most evident  dorsally. No soft tissue air or radiopaque foreign body. IMPRESSION: 1. No acute fracture.  No dislocation. 2. 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 3. Nonspecific soft tissue swelling most evident dorsally. Electronically Signed   By: Amie Portlandavid  Ormond M.D.   On: 05/01/2016 18:14      Scheduled Meds: . benztropine  1 mg Oral BID  . feeding supplement (ENSURE ENLIVE)  237 mL Oral Q24H  . haloperidol  25 mg Oral QHS  . lamoTRIgine  100 mg Oral BID  . lamoTRIgine  25 mg Oral Daily  . lamoTRIgine  50 mg Oral QHS  . Oxcarbazepine  300 mg Oral BID  . pantoprazole  40 mg Oral Daily  . piperacillin-tazobactam (ZOSYN)  IV  3.375 g Intravenous Q8H  . polycarbophil  625 mg Oral q morning - 10a  . potassium chloride  10 mEq Oral q morning - 10a  . tamsulosin  0.8 mg Oral Daily  . traZODone  100 mg Oral QHS   Continuous Infusions:    LOS: 1 day    Time spent in minutes: 35    Rudy Domek, MD [facility] Pager: [email or web address] Password [email or web address] 05/03/2016, 1:17 PM

## 2016-05-03 NOTE — Progress Notes (Signed)
CSW contacted Antonio DowdyStella Montoya, patients legal guardian, for more information regarding patients current address. Ms. Antonio Montoya stated patient is from [facility] she was unsure if this was a group home. [facility] address is [address], [place], [id number]- correct address on facesheet. Ms. Antonio Montoya stated patient has been at current address for "many years" and did not have any complaints about facility.   Contact for [facility]" is Antonio Montoya at [phone]- correct number on facesheet.   CSW will update family once closer to discharge.  Stacy GardnerErin Lux Skilton, LCSWA Clinical Social Worker [phone]

## 2016-05-03 NOTE — Progress Notes (Addendum)
Patient consistently Bradycardia with HR dropping down as low as 37, MD aware. Will continue to monitor.

## 2016-05-03 NOTE — Progress Notes (Signed)
Patient placed in a low bed, with high sensitivity bed alarm, and fall mats on both sides of his bed. Restraints removed and patient calm at this time. RN to continuously monitor for any patient issues.

## 2016-05-03 NOTE — Consult Note (Signed)
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 Thurmond Butts Patient Identification: Antonio Montoya MRN:  [id number] Principal Diagnosis: Acute delirium Diagnosis:   Patient Active Problem List   Diagnosis Date Noted  . Pressure injury of skin [L89.90] 05/02/2016  . Cellulitis of hand [Y10.175] 05/02/2016  . Hypothermia [T68.XXXA] 05/02/2016  . Elevated LFTs [R79.89]   . Acute delirium [R41.0] 05/01/2016    Total Time spent with patient: 1 hour  Subjective:   Antonio Montoya is a 66 y.o. male patient admitted with urinary retention and agitation.  HPI:  Antonio Montoya is a 66 y.o. male, seen, chart reviewed. Patient is a resident of group home with mental retardation and schizophrenia, Admitted to [facility] intensive care unit for increased symptoms of agitation secondary to urinary retention and required Foley catheterization. Patient is a resident of group home and has no contact with staff from the group home and family members. Patient is currently lying down on his bed and seems to be sedated with his medication. Patient is seen, chart reviewed and case discussed with the staff RN. Review of medication indicated patient has been taking Haldol 25 mg at bedtime, risperidone M tablets 2 mg twice daily, immediate release 3 mg 3 times daily, trazodone 100 mg at bedtime, lamotrigine 100 mg twice daily and also 25 mg twice daily. Patient was not able to provide any history during this evaluation. I have tried to contact his sister on phone without sepsis today.  Medical history: Patient is a 66 years old malehad come to the ER 2 days ago for urinary retention and had Foley catheter placed and sent back to group home. Patient was found to be increasingly agitated. Patient also had hurt his left hand in trying to hit something. Patient was brought back to the ER because of increasing difficulty in managing  with agitation. In the ER patient had to be sedated with Ativan.   ED Course: In the ER patient is found to have hypothermia and also because of the agitation was given Ativan. Since patient will need higher level of care patient will need to get psychiatry consult for his worsening mental status patient is being admitted. However since patient is having hypothermia with streaking of his both upper extremity concerning for cellulitis and swelling of the left hand patient will be admitted to medical service first. On my exam at the bedside in stepdown unit patient is presently sleeping after getting sedation. Both upper extremity has some streaking going up from both hands to the arms.  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 Wynelle Cleveland for psychiatric consultation follow-up. Reportedly patient has been agitated and combative and required Ativan and patient continued to be sleepy during my visit. Patient is also suffering with hypothermia and urinary incontinence. Patient receiving Foley catheter. LCSW obtained list of medications from the group home, which were reviewed.  Risk to Self: Is patient at risk for suicide?: No Risk to Others:   Prior Inpatient Therapy:   Prior Outpatient Therapy:    Past Medical History:  Past Medical History:  Diagnosis Date  . MR (mental retardation)   . Schizophrenia ([employer])    History reviewed. No pertinent surgical history. Family History:  Family History  Problem Relation Age of Onset  . Family history unknown: Yes   Family Psychiatric  History: Noncontributory Social History:  History  Alcohol Use No     History  Drug Use No    Social History   Social History  . Marital status: Single    Spouse name: N/A  . Number of children: N/A  . Years of education: N/A   Social History Main Topics  . Smoking status: Never Smoker  . Smokeless tobacco: Never  Used  . Alcohol use No  . Drug use: No  . Sexual activity: No   Other Topics Concern  . None   Social History Narrative  . None   Additional Social History:    Allergies:  No Known Allergies  Labs:  Results for orders placed or performed during the hospital encounter of 05/01/16 (from the past 48 hour(s))  CBC with Differential/Platelet     Status: Abnormal   Collection Time: 05/01/16  7:30 PM  Result Value Ref Range   WBC 10.8 (H) 4.0 - 10.5 K/uL   RBC 3.21 (L) 4.22 - 5.81 MIL/uL   Hemoglobin 10.5 (L) 13.0 - 17.0 g/dL   HCT 30.1 (L) 39.0 - 52.0 %   MCV 93.8 78.0 - 100.0 fL   MCH 32.7 26.0 - 34.0 pg   MCHC 34.9 30.0 - 36.0 g/dL   RDW 12.6 11.5 - 15.5 %   Platelets 78 (L) 150 - 400 K/uL    Comment: PLATELET COUNT CONFIRMED BY SMEAR SPECIMEN CHECKED FOR CLOTS REPEATED TO VERIFY    Neutrophils Relative % 88 %   Lymphocytes Relative 3 %   Monocytes Relative 6 %   Eosinophils Relative 0 %   Basophils Relative 0 %   Band Neutrophils 3 %   Neutro Abs 9.9 (H) 1.7 - 7.7 K/uL   Lymphs Abs 0.3 (L) 0.7 - 4.0 K/uL   Monocytes Absolute 0.6 0.1 - 1.0 K/uL   Eosinophils Absolute 0.0 0.0 - 0.7 K/uL   Basophils Absolute 0.0 0.0 - 0.1 K/uL   Smear Review PLATELET COUNT CONFIRMED BY SMEAR   Ammonia     Status: None   Collection Time: 05/01/16  7:30 PM  Result Value Ref Range   Ammonia 24 9 - 35 umol/L  Basic metabolic panel     Status: Abnormal   Collection Time: 05/01/16  7:30 PM  Result Value Ref Range   Sodium 142 135 - 145 mmol/L   Potassium 3.9 3.5 - 5.1 mmol/L   Chloride 105 101 - 111 mmol/L   CO2 31 22 - 32 mmol/L   Glucose, Bld 64 (L) 65 - 99 mg/dL   BUN 44 (H) 6 - 20 mg/dL   Creatinine, Ser 1.41 (H) 0.61 - 1.24 mg/dL   Calcium 9.5 8.9 - 10.3 mg/dL   GFR calc non Af Amer 51 (L) >60 mL/min   GFR calc Af Amer 59 (L) >60 mL/min    Comment: (NOTE) The eGFR has been calculated using the CKD EPI equation. This calculation has not been validated in all clinical  situations. eGFR's persistently <60 mL/min signify possible Chronic Kidney Disease.    Anion gap 6 5 - 15  I-Stat CG4 Lactic Acid, ED     Status: None   Collection Time: 05/01/16  7:42 PM  Result Value Ref Range   Lactic Acid, Venous 0.94 0.5 - 1.9 mmol/L  CBG monitoring, ED     Status: None   Collection Time: 05/02/16  1:09 AM  Result Value Ref Range   Glucose-Capillary 74 65 - 99 mg/dL  I-Stat CG4  Lactic Acid, ED     Status: None   Collection Time: 05/02/16  1:27 AM  Result Value Ref Range   Lactic Acid, Venous 0.81 0.5 - 1.9 mmol/L  MRSA PCR Screening     Status: None   Collection Time: 05/02/16  3:08 AM  Result Value Ref Range   MRSA by PCR NEGATIVE NEGATIVE    Comment:        The GeneXpert MRSA Assay (FDA approved for NASAL specimens only), is one component of a comprehensive MRSA colonization surveillance program. It is not intended to diagnose MRSA infection nor to guide or monitor treatment for MRSA infections.   Culture, blood (x 2)     Status: None (Preliminary result)   Collection Time: 05/02/16  8:19 AM  Result Value Ref Range   Specimen Description BLOOD LEFT ARM    Special Requests IN PEDIATRIC BOTTLE 1CC    Culture      NO GROWTH 1 DAY Performed at [facility], [place] [address] [place], [place] [address]    Report Status PENDING   Culture, blood (x 2)     Status: None (Preliminary result)   Collection Time: 05/02/16  8:19 AM  Result Value Ref Range   Specimen Description BLOOD LEFT ARM    Special Requests IN PEDIATRIC BOTTLE 2CC    Culture      NO GROWTH 1 DAY Performed at [facility], [place] [address], [place], [place] [address]    Report Status PENDING   CBC with Differential     Status: Abnormal   Collection Time: 05/02/16  8:19 AM  Result Value Ref Range   WBC 12.8 (H) 4.0 - 10.5 K/uL   RBC 3.39 (L) 4.22 - 5.81 MIL/uL   Hemoglobin 10.8 (L) 13.0 - 17.0 g/dL   HCT 31.2 (L) 39.0 - 52.0 %   MCV 92.0 78.0 - 100.0 fL   MCH 31.9 26.0  - 34.0 pg   MCHC 34.6 30.0 - 36.0 g/dL   RDW 13.5 11.5 - 15.5 %   Platelets 79 (L) 150 - 400 K/uL    Comment: SPECIMEN CHECKED FOR CLOTS REPEATED TO VERIFY PLATELET COUNT CONFIRMED BY SMEAR    Neutrophils Relative % 89 %   Neutro Abs 11.4 (H) 1.7 - 7.7 K/uL   Lymphocytes Relative 7 %   Lymphs Abs 0.9 0.7 - 4.0 K/uL   Monocytes Relative 4 %   Monocytes Absolute 0.5 0.1 - 1.0 K/uL   Eosinophils Relative 0 %   Eosinophils Absolute 0.0 0.0 - 0.7 K/uL   Basophils Relative 0 %   Basophils Absolute 0.0 0.0 - 0.1 K/uL  Comprehensive metabolic panel     Status: Abnormal   Collection Time: 05/02/16  8:19 AM  Result Value Ref Range   Sodium 143 135 - 145 mmol/L   Potassium 4.2 3.5 - 5.1 mmol/L   Chloride 110 101 - 111 mmol/L   CO2 28 22 - 32 mmol/L   Glucose, Bld 66 65 - 99 mg/dL   BUN 44 (H) 6 - 20 mg/dL   Creatinine, Ser 1.23 0.61 - 1.24 mg/dL   Calcium 9.6 8.9 - 10.3 mg/dL   Total Protein 5.9 (L) 6.5 - 8.1 g/dL   Albumin 3.1 (L) 3.5 - 5.0 g/dL   AST 136 (H) 15 - 41 U/L   ALT 84 (H) 17 - 63 U/L   Alkaline Phosphatase 92 38 - 126 U/L   Total Bilirubin 0.6 0.3 - 1.2  mg/dL   GFR calc non Af Amer >60 >60 mL/min   GFR calc Af Amer >60 >60 mL/min    Comment: (NOTE) The eGFR has been calculated using the CKD EPI equation. This calculation has not been validated in all clinical situations. eGFR's persistently <60 mL/min signify possible Chronic Kidney Disease.    Anion gap 5 5 - 15  Lactic acid, plasma     Status: None   Collection Time: 05/02/16  8:19 AM  Result Value Ref Range   Lactic Acid, Venous 1.2 0.5 - 1.9 mmol/L  Procalcitonin     Status: None   Collection Time: 05/02/16  8:19 AM  Result Value Ref Range   Procalcitonin 1.09 ng/mL    Comment:        Interpretation: PCT > 0.5 ng/mL and <= 2 ng/mL: Systemic infection (sepsis) is possible, but other conditions are known to elevate PCT as well. (NOTE)         ICU PCT Algorithm               Non ICU PCT Algorithm     ----------------------------     ------------------------------         PCT < 0.25 ng/mL                 PCT < 0.1 ng/mL     Stopping of antibiotics            Stopping of antibiotics       strongly encouraged.               strongly encouraged.    ----------------------------     ------------------------------       PCT level decrease by               PCT < 0.25 ng/mL       >= 80% from peak PCT       OR PCT 0.25 - 0.5 ng/mL          Stopping of antibiotics                                             encouraged.     Stopping of antibiotics           encouraged.    ----------------------------     ------------------------------       PCT level decrease by              PCT >= 0.25 ng/mL       < 80% from peak PCT        AND PCT >= 0.5 ng/mL             Continuing antibiotics                                              encouraged.       Continuing antibiotics            encouraged.    ----------------------------     ------------------------------     PCT level increase compared          PCT > 0.5 ng/mL         with peak PCT AND          PCT >=  0.5 ng/mL             Escalation of antibiotics                                          strongly encouraged.      Escalation of antibiotics        strongly encouraged.   Protime-INR     Status: None   Collection Time: 05/02/16  8:19 AM  Result Value Ref Range   Prothrombin Time 13.3 11.4 - 15.2 seconds   INR 1.01   APTT     Status: None   Collection Time: 05/02/16  8:19 AM  Result Value Ref Range   aPTT 30 24 - 36 seconds  HIV antibody (Routine Testing)     Status: None   Collection Time: 05/02/16  8:19 AM  Result Value Ref Range   HIV Screen 4th Generation wRfx Non Reactive Non Reactive    Comment: (NOTE) Performed At: [facility] [place], [place] [id number] Lindon Romp MD [id number]   Magnesium     Status: None   Collection Time: 05/02/16  8:19 AM  Result Value Ref Range   Magnesium 2.4 1.7 - 2.4  mg/dL  TSH     Status: None   Collection Time: 05/02/16  8:19 AM  Result Value Ref Range   TSH 1.788 0.350 - 4.500 uIU/mL    Comment: Performed by a 3rd Generation assay with a functional sensitivity of <=0.01 uIU/mL.  Cortisol     Status: None   Collection Time: 05/02/16  8:19 AM  Result Value Ref Range   Cortisol, Plasma 19.8 ug/dL    Comment: (NOTE) AM    6.7 - 22.6 ug/dL PM   <10.0       ug/dL Performed at [place] [address], [place], [place] [address]   Basic metabolic panel     Status: Abnormal   Collection Time: 05/02/16  8:19 AM  Result Value Ref Range   Sodium 144 135 - 145 mmol/L   Potassium 4.3 3.5 - 5.1 mmol/L   Chloride 109 101 - 111 mmol/L   CO2 30 22 - 32 mmol/L   Glucose, Bld 72 65 - 99 mg/dL   BUN 44 (H) 6 - 20 mg/dL   Creatinine, Ser 1.29 (H) 0.61 - 1.24 mg/dL   Calcium 9.5 8.9 - 10.3 mg/dL   GFR calc non Af Amer 57 (L) >60 mL/min   GFR calc Af Amer >60 >60 mL/min    Comment: (NOTE) The eGFR has been calculated using the CKD EPI equation. This calculation has not been validated in all clinical situations. eGFR's persistently <60 mL/min signify possible Chronic Kidney Disease.    Anion gap 5 5 - 15  Glucose, capillary     Status: Abnormal   Collection Time: 05/02/16  8:29 AM  Result Value Ref Range   Glucose-Capillary 59 (L) 65 - 99 mg/dL   Comment 1 Notify RN    Comment 2 Document in Chart   Glucose, capillary     Status: None   Collection Time: 05/02/16  9:13 AM  Result Value Ref Range   Glucose-Capillary 74 65 - 99 mg/dL  Glucose, capillary     Status: None   Collection Time: 05/02/16 12:21 PM  Result Value Ref Range   Glucose-Capillary 79 65 - 99 mg/dL  Comment 1 Notify RN    Comment 2 Document in Chart   Glucose, capillary     Status: None   Collection Time: 05/02/16  5:56 PM  Result Value Ref Range   Glucose-Capillary 82 65 - 99 mg/dL   Comment 1 Notify RN    Comment 2 Document in Chart   Comprehensive metabolic panel      Status: Abnormal   Collection Time: 05/02/16  6:54 PM  Result Value Ref Range   Sodium 143 135 - 145 mmol/L   Potassium 4.1 3.5 - 5.1 mmol/L   Chloride 111 101 - 111 mmol/L   CO2 29 22 - 32 mmol/L   Glucose, Bld 89 65 - 99 mg/dL   BUN 46 (H) 6 - 20 mg/dL   Creatinine, Ser 0.68 (H) 0.61 - 1.24 mg/dL   Calcium 9.3 8.9 - 93.4 mg/dL   Total Protein 5.6 (L) 6.5 - 8.1 g/dL   Albumin 2.9 (L) 3.5 - 5.0 g/dL   AST 068 (H) 15 - 41 U/L   ALT 77 (H) 17 - 63 U/L   Alkaline Phosphatase 88 38 - 126 U/L   Total Bilirubin 0.9 0.3 - 1.2 mg/dL   GFR calc non Af Amer 52 (L) >60 mL/min   GFR calc Af Amer >60 >60 mL/min    Comment: (NOTE) The eGFR has been calculated using the CKD EPI equation. This calculation has not been validated in all clinical situations. eGFR's persistently <60 mL/min signify possible Chronic Kidney Disease.    Anion gap 3 (L) 5 - 15  Basic metabolic panel     Status: Abnormal   Collection Time: 05/03/16  8:48 AM  Result Value Ref Range   Sodium 146 (H) 135 - 145 mmol/L   Potassium 3.8 3.5 - 5.1 mmol/L   Chloride 112 (H) 101 - 111 mmol/L   CO2 29 22 - 32 mmol/L   Glucose, Bld 80 65 - 99 mg/dL   BUN 38 (H) 6 - 20 mg/dL   Creatinine, Ser 4.03 (H) 0.61 - 1.24 mg/dL   Calcium 9.5 8.9 - 35.3 mg/dL   GFR calc non Af Amer 56 (L) >60 mL/min   GFR calc Af Amer >60 >60 mL/min    Comment: (NOTE) The eGFR has been calculated using the CKD EPI equation. This calculation has not been validated in all clinical situations. eGFR's persistently <60 mL/min signify possible Chronic Kidney Disease.    Anion gap 5 5 - 15  CBC     Status: Abnormal   Collection Time: 05/03/16  8:48 AM  Result Value Ref Range   WBC 9.5 4.0 - 10.5 K/uL   RBC 3.05 (L) 4.22 - 5.81 MIL/uL   Hemoglobin 9.6 (L) 13.0 - 17.0 g/dL   HCT 31.7 (L) 40.9 - 92.7 %   MCV 91.5 78.0 - 100.0 fL   MCH 31.5 26.0 - 34.0 pg   MCHC 34.4 30.0 - 36.0 g/dL   RDW 80.0 44.7 - 15.8 %   Platelets 61 (L) 150 - 400 K/uL     Comment: SPECIMEN CHECKED FOR CLOTS REPEATED TO VERIFY CONSISTENT WITH PREVIOUS RESULT   Comprehensive metabolic panel     Status: Abnormal   Collection Time: 05/03/16  1:53 PM  Result Value Ref Range   Sodium 144 135 - 145 mmol/L   Potassium 3.8 3.5 - 5.1 mmol/L   Chloride 113 (H) 101 - 111 mmol/L   CO2 26 22 - 32 mmol/L   Glucose, Bld 71  65 - 99 mg/dL   BUN 35 (H) 6 - 20 mg/dL   Creatinine, Ser 1.24 0.61 - 1.24 mg/dL   Calcium 9.2 8.9 - 10.3 mg/dL   Total Protein 5.5 (L) 6.5 - 8.1 g/dL   Albumin 2.8 (L) 3.5 - 5.0 g/dL   AST 107 (H) 15 - 41 U/L   ALT 69 (H) 17 - 63 U/L   Alkaline Phosphatase 80 38 - 126 U/L   Total Bilirubin 1.5 (H) 0.3 - 1.2 mg/dL   GFR calc non Af Amer 60 (L) >60 mL/min   GFR calc Af Amer >60 >60 mL/min    Comment: (NOTE) The eGFR has been calculated using the CKD EPI equation. This calculation has not been validated in all clinical situations. eGFR's persistently <60 mL/min signify possible Chronic Kidney Disease.    Anion gap 5 5 - 15    Current Facility-Administered Medications  Medication Dose Route Frequency Provider Last Rate Last Dose  . acetaminophen (TYLENOL) tablet 650 mg  650 mg Oral Q6H PRN Rise Patience, MD       Or  . acetaminophen (TYLENOL) suppository 650 mg  650 mg Rectal Q6H PRN Rise Patience, MD      . benztropine (COGENTIN) tablet 1 mg  1 mg Oral BID Debbe Odea, MD   1 mg at 05/03/16 2952  . feeding supplement (ENSURE ENLIVE) (ENSURE ENLIVE) liquid 237 mL  237 mL Oral Q24H [name], MD      . haloperidol (HALDOL) tablet 25 mg  25 mg Oral QHS Rise Patience, MD   25 mg at 05/02/16 2154  . lamoTRIgine (LAMICTAL) tablet 100 mg  100 mg Oral BID Rise Patience, MD   100 mg at 05/03/16 8413  . lamoTRIgine (LAMICTAL) tablet 25 mg  25 mg Oral Daily Rise Patience, MD   25 mg at 05/03/16 2440  . lamoTRIgine (LAMICTAL) tablet 50 mg  50 mg Oral QHS Rise Patience, MD   50 mg at 05/02/16 2151  . LORazepam  (ATIVAN) injection 1 mg  1 mg Intravenous Q6H PRN Debbe Odea, MD   1 mg at 05/03/16 0515  . ondansetron (ZOFRAN) tablet 4 mg  4 mg Oral Q6H PRN Rise Patience, MD       Or  . ondansetron [facility]) injection 4 mg  4 mg Intravenous Q6H PRN Rise Patience, MD      . Oxcarbazepine (TRILEPTAL) tablet 300 mg  300 mg Oral BID Debbe Odea, MD   300 mg at 05/03/16 1027  . pantoprazole (PROTONIX) EC tablet 40 mg  40 mg Oral Daily Rise Patience, MD   40 mg at 05/03/16 2536  . piperacillin-tazobactam (ZOSYN) IVPB 3.375 g  3.375 g Intravenous Q8H Dorrene German, RPH   3.375 g at 05/03/16 1349  . polycarbophil (FIBERCON) tablet 625 mg  625 mg Oral q morning - 10a Rise Patience, MD   625 mg at 05/03/16 0936  . potassium chloride (K-DUR,KLOR-CON) CR tablet 10 mEq  10 mEq Oral q morning - 10a Rise Patience, MD   10 mEq at 05/03/16 0936  . tamsulosin (FLOMAX) capsule 0.8 mg  0.8 mg Oral Daily Rise Patience, MD   0.8 mg at 05/03/16 0936  . traZODone (DESYREL) tablet 100 mg  100 mg Oral QHS Rise Patience, MD   100 mg at 05/02/16 2151    Musculoskeletal: Strength & Muscle Tone: decreased Gait & Station: unable to stand  Patient leans: N/A  Psychiatric Specialty Exam: Physical Exam  Skin: Rash noted.    ROS unable to obtain secondary to altered mental status and excessive sedation.   Blood pressure (!) 120/58, pulse (!) 49, temperature 98 F (36.7 C), temperature source Oral, resp. rate (!) 24, height 6' (1.829 m), weight 71 kg (156 lb 8.4 oz), SpO2 98 %.Body mass index is 21.23 kg/m.  General Appearance: Casual  Eye Contact:  NA  Speech:  NA  Volume:  na  Mood:  NA  Affect:  NA  Thought Process:  NA  Orientation:  NA  Thought Content:  NA  Suicidal Thoughts:  na  Homicidal Thoughts:  NA  Memory:  NA  Judgement:  NA  Insight:  NA  Psychomotor Activity:  NA  Concentration:  NA  Recall:  NA  Fund of Knowledge:  NA  Language:  NA  Akathisia:  NA   Handed:  NA  AIMS (if indicated):     Assets:  Others:  tbd  ADL's:  Impaired  Cognition:  NA  Sleep:        Treatment Plan Summary: 66 years old male with the known history of mental retardation and schizophrenia and a resident of group home presented with the increased agitation secondary to urinary retention. Patient needed medication to sedate him or calm him down.   Case discussed with unit LCSW  And Dr. Wynelle Cleveland, she has been suffering with hypothermia and urinary retention and continued to have further medical indiscretion.  Patient needed to be monitored for the EKG for possible QTC prolongation.  Medication changes as below  Decrease Haldol 15 mg at bedtime, benztropine 1 mg daily, discontinue lamotrigine 25 mg daily  Add lorazepam 1 mg at bedtime for agitation and aggressive behavior.   Reportedly patient has been receiving Ativan at nighttime to control agitation and aggressive behaviors. Patient will be followed up tomorrow for the venous assessment if he is able to stay awake and communicate with [place].  Daily contact with patient to assess and evaluate symptoms and progress in treatment and Medication management   Appreciate psychiatric consultation and follow up as clinically required Please contact [phone] or [phone] if needs further assistance  Disposition: Supportive therapy provided about ongoing stressors.  Ambrose Finland, MD 05/03/2016 5:19 PM

## 2016-05-03 NOTE — Progress Notes (Signed)
Bladder scanned done showed >67000ml. In and out done. Unable to document urine output because of patient is agitated while doing the in and out. But urine outputs is more likely 600-81000ml. Will continue to monitor.

## 2016-05-03 NOTE — Progress Notes (Signed)
Patient HR is sustaining 40-50. N.P. [employer]. No new orders. Will continue to monitor.

## 2016-05-03 NOTE — Progress Notes (Signed)
Patient became very agitated ripped off all equipment and out his IV despite safety mitts. 4 RN's and 1 NT spent 35 minutes attempting to calm patient down and place an IV in order to give patient PRN Ativan. Various calming techniques attempted and patient continues kicking, punching and banging his head against staff and bed rails. After IV placed  Ativan given and patient fixed in bed. Patient became calm and was able to rest. Vitals remain stable and patient easily arouseable. Will continue to monitor.

## 2016-05-04 DIAGNOSIS — D696 Thrombocytopenia, unspecified: Secondary | ICD-10-CM

## 2016-05-04 DIAGNOSIS — R339 Retention of urine, unspecified: Secondary | ICD-10-CM

## 2016-05-04 LAB — GLUCOSE, CAPILLARY
GLUCOSE-CAPILLARY: 127 mg/dL — AB (ref 65–99)
GLUCOSE-CAPILLARY: 22 mg/dL — AB (ref 65–99)
GLUCOSE-CAPILLARY: 75 mg/dL (ref 65–99)
Glucose-Capillary: 112 mg/dL — ABNORMAL HIGH (ref 65–99)
Glucose-Capillary: 69 mg/dL (ref 65–99)
Glucose-Capillary: 86 mg/dL (ref 65–99)

## 2016-05-04 LAB — CBC
HEMATOCRIT: 27.4 % — AB (ref 39.0–52.0)
Hemoglobin: 9.5 g/dL — ABNORMAL LOW (ref 13.0–17.0)
MCH: 32.3 pg (ref 26.0–34.0)
MCHC: 34.7 g/dL (ref 30.0–36.0)
MCV: 93.2 fL (ref 78.0–100.0)
Platelets: 77 10*3/uL — ABNORMAL LOW (ref 150–400)
RBC: 2.94 MIL/uL — ABNORMAL LOW (ref 4.22–5.81)
RDW: 13.6 % (ref 11.5–15.5)
WBC: 7 10*3/uL (ref 4.0–10.5)

## 2016-05-04 MED ORDER — LORAZEPAM 2 MG/ML IJ SOLN
0.5000 mg | Freq: Once | INTRAMUSCULAR | Status: AC
  Administered 2016-05-04: 0.5 mg via INTRAVENOUS

## 2016-05-04 MED ORDER — DEXTROSE 50 % IV SOLN
25.0000 mL | Freq: Once | INTRAVENOUS | Status: AC
  Administered 2016-05-04: 25 mL via INTRAVENOUS

## 2016-05-04 MED ORDER — DEXTROSE 50 % IV SOLN
INTRAVENOUS | Status: AC
  Administered 2016-05-04: 25 mL
  Filled 2016-05-04: qty 50

## 2016-05-04 MED ORDER — DEXTROSE 50 % IV SOLN
INTRAVENOUS | Status: AC
  Filled 2016-05-04: qty 50

## 2016-05-04 NOTE — Progress Notes (Signed)
LCSWA spoke with patient caregiver Antonio Montoya about patient functioning and care at the group home. Patient caregiver provided LCSWA with list of medications requested by psychiatrist. He expressed the patient has been living at the Group with him for five years. Over the course of two weeks he has seen the patient more lethargic and weak. The patient has been more aggressive. He is worried about patient returning with a catheter and not being able to properly  manage it at group home. He request to be updated about patient care.  LCSWA provided Psychiatrist with list of medications emailed by patient caregiver.    LCSWA attempted to meet with patient. Patient soundly sleeping.  CSW  will continue to follow and assist with patient disposition.    Antonio Montoya, Antonio MajorsLCSWA, MSW Clinical Social Worker 5E and Psychiatric Service Line [phone]

## 2016-05-04 NOTE — Progress Notes (Addendum)
PROGRESS NOTE    Antonio Montoya   RUE:[id number]  DOB: August 10, 1950  DOA: 05/01/2016 PCP: Shayne Alken, MD (Inactive)   Brief Narrative:  Antonio Montoya is a 66 y.o. male with DM diet controlled, mental retardation and schizophrenia had come to the ER 2 days ago for urinary retention (bladder scan showed > 1 L urine and caretaker stated he had not urinated in 18 hrs) and had Foley catheter placed and sent back to group home.  Patient is unable to give history- per ER HPI, he came if in for hematuria after pulling on his foley cath. He has beem increasingly agitated for a few wks per caregiver. Caregiver stated pt recently had a fit and broke several items including a glass ~ 3 days prior to admission & had swelling to the left hand on the day of admission .  In the ER patient had to be sedated with Ativan. He was found to have hypothermia with temp of 95.7.  This is the 5th visit to the ER since Jan of this year.  1/21- stumbled, fell, abrasion to forehead 2/4- left subconjunctival hemorrhage 2/16- off balance- head CT ok  2/16- U retention  Subjective: Non-verbal  Assessment & Plan:   Principal Problem:   Acute delirium - likely exacerbation of his psych issues- having severe admission on admission which is now intermittant - he is on numerous antipsychotics and now IV Ativan is being given- his group home does not want him back -as the foley may be the cause of his increased agitation,  I asked RN to pull foley  - agitation did not improve- - psych consulted for assistance - the following changed made thus far: decreased Haldol from 25 to 15 QHS, Added 1mg  Ativan QHS, Lamictal decreased from 125 to 100 in AM and Risperdal ( which was given routine and PRN at home) stopped - he is sleeping quite well with QHS Ativan, therefore will d/c Trazodone at bedtime - following QTc  Active Problems:  Swelling of hand - xray does not show fracture - swelling due to traumatic injury (based on  H and P) vs cellulitis- vs gout??  --  given Clinda last in ER and then zosyn & Vanc- Vanc stopped 2/21- hand swelling not improving therefore I doubt it is cellulitis and is likely a traumatic injury- hand is non tender to touch - I will stop Zosyn today    Hypothermia - doubt he was septic? - even if he had cellulitis of left hand, it is not significant enough to cause hypothermia - blood cultures negative, UA negative - agree with checking cortisol and thyroid functions which are normal - suspect poor thermoregulation due to antipsychotics- have spoken with psych about this- Haldol and Lamictal dose decreased- we also stopped PRN Risperdal- following temps closely  Elevated LFTs -  CT abd shows no signs of infection- he has a mild CBD dilatation- doubt this is significant - suspect LFTs are elevated due to psych medications  Thrombocytopenia - noted on 2/16 and has progressed- medication related?? Due to Zosyn? - stopping today   - following daily  U retention/ AKI - cont Flomax- foley taken out but continue to retain and foley replaced. - per psych, his psych meds may be causing this- medications changes to be made by Dr Sheryle Spray     Pressure injury of skin - left buttock- stage 2  DVT prophylaxis: SCDs Code Status: Full code Family Communication:  Disposition Plan: follow in SDU due to hypothermia  Consultants:   psych Procedures:    Antimicrobials:  Anti-infectives    Start     Dose/Rate Route Frequency Ordered Stop   05/02/16 2000  vancomycin (VANCOCIN) IVPB 750 mg/150 ml premix  Status:  Discontinued     750 mg 150 mL/hr over 60 Minutes Intravenous Every 12 hours 05/02/16 0643 05/03/16 0805   05/02/16 1400  piperacillin-tazobactam (ZOSYN) IVPB 3.375 g  Status:  Discontinued     3.375 g 12.5 mL/hr over 240 Minutes Intravenous Every 8 hours 05/02/16 0532 05/04/16 1125   05/02/16 0415  piperacillin-tazobactam (ZOSYN) IVPB 3.375 g     3.375 g 100 mL/hr over 30  Minutes Intravenous  Once 05/02/16 0407 05/02/16 0553   05/02/16 0415  vancomycin (VANCOCIN) IVPB 1000 mg/200 mL premix     1,000 mg 200 mL/hr over 60 Minutes Intravenous  Once 05/02/16 0407 05/02/16 0745   05/01/16 2200  clindamycin (CLEOCIN) IVPB 600 mg     600 mg 100 mL/hr over 30 Minutes Intravenous  Once 05/01/16 2148 05/02/16 0010       Objective: Vitals:   05/04/16 0807 05/04/16 0900 05/04/16 1000 05/04/16 1100  BP: 119/71  (!) 125/57   Pulse: (!) 53 (!) 54 (!) 48 (!) 57  Resp: (!) 22 20 20 17   Temp:      TempSrc:      SpO2: 99% 99% 96% 97%  Weight:      Height:        Intake/Output Summary (Last 24 hours) at 05/04/16 1125 Last data filed at 05/04/16 0900  Gross per 24 hour  Intake              150 ml  Output             1300 ml  Net            -1150 ml   Filed Weights   05/02/16 0640  Weight: 71 kg (156 lb 8.4 oz)    Examination: General exam: alert, nonverbal, does not follow commands HEENT: PERRLA, oral mucosa moist, no sclera icterus or thrush Respiratory system: Clear to auscultation. Respiratory effort normal. Cardiovascular system: S1 & S2 heard, RRR.  No murmurs  Gastrointestinal system: Abdomen soft, non-tender, nondistended. Normal bowel sound. No organomegaly Central nervous system: moves all extremities Extremities: No cyanosis, clubbing- swelling of left hand improving but not resolved Skin: No rashes or ulcers Psychiatry:  Intermittently agitated requiring sedation    Data Reviewed: I have personally reviewed following labs and imaging studies  CBC:  Recent Labs Lab 04/27/16 2218 05/01/16 1930 05/02/16 0819 05/03/16 0848 05/04/16 0331  WBC 5.7 10.8* 12.8* 9.5 7.0  NEUTROABS 4.7 9.9* 11.4*  --   --   HGB 12.1* 10.5* 10.8* 9.6* 9.5*  HCT 34.3* 30.1* 31.2* 27.9* 27.4*  MCV 93.7 93.8 92.0 91.5 93.2  PLT 132* 78* 79* 61* 77*   Basic Metabolic Panel:  Recent Labs Lab 05/01/16 1930 05/02/16 0819 05/02/16 1854 05/03/16 0848  05/03/16 1353  NA 142 143  144 143 146* 144  K 3.9 4.2  4.3 4.1 3.8 3.8  CL 105 110  109 111 112* 113*  CO2 31 28  30 29 29 26   GLUCOSE 64* 66  72 89 80 71  BUN 44* 44*  44* 46* 38* 35*  CREATININE 1.41* 1.23  1.29* 1.39* 1.30* 1.24  CALCIUM 9.5 9.6  9.5 9.3 9.5 9.2  MG  --  2.4  --   --   --  GFR: Estimated Creatinine Clearance: 60.4 mL/min (by C-G formula based on SCr of 1.24 mg/dL). Liver Function Tests:  Recent Labs Lab 05/02/16 0819 05/02/16 1854 05/03/16 1353  AST 136* 133* 107*  ALT 84* 77* 69*  ALKPHOS 92 88 80  BILITOT 0.6 0.9 1.5*  PROT 5.9* 5.6* 5.5*  ALBUMIN 3.1* 2.9* 2.8*   No results for input(s): LIPASE, AMYLASE in the last 168 hours.  Recent Labs Lab 05/01/16 1930  AMMONIA 24   Coagulation Profile:  Recent Labs Lab 05/02/16 0819  INR 1.01   Cardiac Enzymes: No results for input(s): CKTOTAL, CKMB, CKMBINDEX, TROPONINI in the last 168 hours. BNP (last 3 results) No results for input(s): PROBNP in the last 8760 hours. HbA1C: No results for input(s): HGBA1C in the last 72 hours. CBG:  Recent Labs Lab 05/02/16 0913 05/02/16 1221 05/02/16 1756 05/04/16 0636 05/04/16 0656  GLUCAP 74 79 82 69 127*   Lipid Profile: No results for input(s): CHOL, HDL, LDLCALC, TRIG, CHOLHDL, LDLDIRECT in the last 72 hours. Thyroid Function Tests:  Recent Labs  05/02/16 0819  TSH 1.788   Anemia Panel: No results for input(s): VITAMINB12, FOLATE, FERRITIN, TIBC, IRON, RETICCTPCT in the last 72 hours. Urine analysis:    Component Value Date/Time   COLORURINE YELLOW 05/01/2016 0025   APPEARANCEUR CLOUDY (A) 05/01/2016 0025   LABSPEC 1.015 05/01/2016 0025   PHURINE 7.0 05/01/2016 0025   GLUCOSEU NEGATIVE 05/01/2016 0025   HGBUR NEGATIVE 05/01/2016 0025   BILIRUBINUR NEGATIVE 05/01/2016 0025   KETONESUR NEGATIVE 05/01/2016 0025   PROTEINUR NEGATIVE 05/01/2016 0025   UROBILINOGEN 1.0 01/16/2012 1957   NITRITE NEGATIVE 05/01/2016 0025    LEUKOCYTESUR NEGATIVE 05/01/2016 0025   Sepsis Labs: @LABRCNTIP (procalcitonin:4,lacticidven:4) ) Recent Results (from the past 240 hour(s))  MRSA PCR Screening     Status: None   Collection Time: 05/02/16  3:08 AM  Result Value Ref Range Status   MRSA by PCR NEGATIVE NEGATIVE Final    Comment:        The GeneXpert MRSA Assay (FDA approved for NASAL specimens only), is one component of a comprehensive MRSA colonization surveillance program. It is not intended to diagnose MRSA infection nor to guide or monitor treatment for MRSA infections.   Culture, blood (x 2)     Status: None (Preliminary result)   Collection Time: 05/02/16  8:19 AM  Result Value Ref Range Status   Specimen Description BLOOD LEFT ARM  Final   Special Requests IN PEDIATRIC BOTTLE 1CC  Final   Culture   Final    NO GROWTH 2 DAYS Performed at [facility], [address], [place], [place] [address]    Report Status PENDING  Incomplete  Culture, blood (x 2)     Status: None (Preliminary result)   Collection Time: 05/02/16  8:19 AM  Result Value Ref Range Status   Specimen Description BLOOD LEFT ARM  Final   Special Requests IN PEDIATRIC BOTTLE 2CC  Final   Culture   Final    NO GROWTH 2 DAYS Performed at [facility], [address] [place], [place] [address]    Report Status PENDING  Incomplete         Radiology Studies: Ct Abdomen W Contrast  Result Date: 05/03/2016 CLINICAL DATA:  Urinary retention and hematuria after pulling out a Foley catheter. EXAM: CT ABDOMEN WITH CONTRAST TECHNIQUE: Multidetector CT imaging of the abdomen was performed using the standard protocol following bolus administration of intravenous contrast. CONTRAST:  ISOVUE-300 IOPAMIDOL (ISOVUE-300) INJECTION 61% COMPARISON:  CT scan from 2012. FINDINGS: Lower chest: The lung bases demonstrate bilateral effusions and bibasilar infiltrates. The heart is enlarged but stable. There is tortuosity and mild ectasia of  the thoracic aorta. Hepatobiliary: Diffuse fatty infiltration of the liver but no focal hepatic lesions. The gallbladder is grossly normal. Mild common bile duct dilatation without obvious cause. Maximum diameter is 8 mm. Recommend correlation with liver function studies. MRCP would not be an option for this patient. Pancreas: Grossly normal. Spleen: Grossly normal. Adrenals/Urinary Tract: The adrenal glands and kidneys are grossly normal. No hydronephrosis. Small renal cysts. Stomach/Bowel: Grossly normal.  Moderate stool in the colon. Vascular/Lymphatic: The aorta and branch vessels are patent. 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 Possible web or focal dissection in the left iliac artery but it is not completely covered. Other: No ascites. Musculoskeletal: No significant bony findings. IMPRESSION: Limited examination due to patient motion. Small bilateral pleural effusions and bibasilar infiltrates. Mild common bile duct and cystic duct dilatation without obvious cause. The common bile duct tapers normally to the duodenum. Recommend correlation with liver function studies. Patient would not be a candidate for MRCP. No acute abdominal findings, mass lesions or adenopathy. Electronically Signed   By: Rudie Meyer M.D.   On: 05/03/2016 15:17      Scheduled Meds: . benztropine  1 mg Oral Daily  . feeding supplement (ENSURE ENLIVE)  237 mL Oral Q24H  . haloperidol  15 mg Oral QHS  . lamoTRIgine  100 mg Oral BID  . lamoTRIgine  50 mg Oral QHS  . LORazepam  1 mg Oral QHS  . Oxcarbazepine  300 mg Oral BID  . pantoprazole  40 mg Oral Daily  . polycarbophil  625 mg Oral q morning - 10a  . potassium chloride  10 mEq Oral q morning - 10a  . tamsulosin  0.8 mg Oral Daily  . traZODone  100 mg Oral QHS   Continuous Infusions:    LOS: 2 days    Time spent in minutes: 35    Alanni Vader, MD [facility] Pager: [email or web address] Password  [facility] 05/04/2016, 11:25 AM

## 2016-05-04 NOTE — Progress Notes (Signed)
Pt became increasingly agitated, attempting to get out of bed, not following commands. Received verbal orders to give 0.5 mg of Ativan from [place], on call for [facility].  Will continue to monitor.

## 2016-05-04 NOTE — Progress Notes (Signed)
Hypoglycemic Event  CBG: 69  Treatment: D50 IV 25 mL  Symptoms:   Follow-up CBG: ZOXW:[id number]:0655 CBG Result:127  Possible Reasons for Event: Inadequate meal intake  Comments/MD notified:Yes    Tymarion Everard S Dawnette Mione

## 2016-05-04 NOTE — Progress Notes (Signed)
Hypoglycemic Event  CBG: 22  Treatment: Attempted to feed juice pateint refused juice, Dextrose 50 25mL  Symptoms: None  Follow-up CBG: Time:1748 CBG Result:112  Possible Reasons for Event: Inadequate meal intake  Comments/MD notified: MD paged and patient encouraged to eat. Patient refusing PO meal. Attempted Ensure.    Antonio Montoya A [name]

## 2016-05-05 DIAGNOSIS — L8990 Pressure ulcer of unspecified site, unspecified stage: Secondary | ICD-10-CM

## 2016-05-05 DIAGNOSIS — Z79899 Other long term (current) drug therapy: Secondary | ICD-10-CM

## 2016-05-05 LAB — CBC
HCT: 28.2 % — ABNORMAL LOW (ref 39.0–52.0)
Hemoglobin: 9.7 g/dL — ABNORMAL LOW (ref 13.0–17.0)
MCH: 32.1 pg (ref 26.0–34.0)
MCHC: 34.4 g/dL (ref 30.0–36.0)
MCV: 93.4 fL (ref 78.0–100.0)
PLATELETS: 82 10*3/uL — AB (ref 150–400)
RBC: 3.02 MIL/uL — ABNORMAL LOW (ref 4.22–5.81)
RDW: 13.4 % (ref 11.5–15.5)
WBC: 6.9 10*3/uL (ref 4.0–10.5)

## 2016-05-05 LAB — COMPREHENSIVE METABOLIC PANEL
ALK PHOS: 88 U/L (ref 38–126)
ALT: 59 U/L (ref 17–63)
AST: 79 U/L — AB (ref 15–41)
Albumin: 2.9 g/dL — ABNORMAL LOW (ref 3.5–5.0)
Anion gap: 6 (ref 5–15)
BUN: 28 mg/dL — AB (ref 6–20)
CALCIUM: 9.4 mg/dL (ref 8.9–10.3)
CO2: 30 mmol/L (ref 22–32)
CREATININE: 1.33 mg/dL — AB (ref 0.61–1.24)
Chloride: 112 mmol/L — ABNORMAL HIGH (ref 101–111)
GFR calc non Af Amer: 55 mL/min — ABNORMAL LOW (ref >60)
GLUCOSE: 70 mg/dL (ref 65–99)
Potassium: 3.9 mmol/L (ref 3.5–5.1)
SODIUM: 148 mmol/L — AB (ref 135–145)
Total Bilirubin: 0.8 mg/dL (ref 0.3–1.2)
Total Protein: 6.2 g/dL — ABNORMAL LOW (ref 6.5–8.1)

## 2016-05-05 LAB — GLUCOSE, CAPILLARY
GLUCOSE-CAPILLARY: 69 mg/dL (ref 65–99)
GLUCOSE-CAPILLARY: 91 mg/dL (ref 65–99)
Glucose-Capillary: 101 mg/dL — ABNORMAL HIGH (ref 65–99)
Glucose-Capillary: 105 mg/dL — ABNORMAL HIGH (ref 65–99)

## 2016-05-05 MED ORDER — HALOPERIDOL 5 MG PO TABS
5.0000 mg | ORAL_TABLET | Freq: Two times a day (BID) | ORAL | Status: DC
  Administered 2016-05-05 – 2016-05-16 (×19): 5 mg via ORAL
  Filled 2016-05-05 (×22): qty 1

## 2016-05-05 MED ORDER — HALOPERIDOL LACTATE 5 MG/ML IJ SOLN
2.0000 mg | Freq: Once | INTRAMUSCULAR | Status: AC
  Administered 2016-05-05: 2 mg via INTRAVENOUS

## 2016-05-05 MED ORDER — BENZTROPINE MESYLATE 0.5 MG PO TABS
0.5000 mg | ORAL_TABLET | Freq: Every day | ORAL | Status: DC
  Administered 2016-05-06 – 2016-05-24 (×18): 0.5 mg via ORAL
  Filled 2016-05-05 (×18): qty 1

## 2016-05-05 MED ORDER — CARBAMAZEPINE 200 MG PO TABS
200.0000 mg | ORAL_TABLET | Freq: Two times a day (BID) | ORAL | Status: DC
  Administered 2016-05-06 – 2016-05-24 (×31): 200 mg via ORAL
  Filled 2016-05-05 (×45): qty 1

## 2016-05-05 MED ORDER — ASENAPINE MALEATE 5 MG SL SUBL
5.0000 mg | SUBLINGUAL_TABLET | Freq: Two times a day (BID) | SUBLINGUAL | Status: DC | PRN
  Administered 2016-05-05 – 2016-05-19 (×15): 5 mg via SUBLINGUAL
  Filled 2016-05-05 (×20): qty 1

## 2016-05-05 MED ORDER — LORAZEPAM 0.5 MG PO TABS
0.5000 mg | ORAL_TABLET | Freq: Two times a day (BID) | ORAL | Status: DC
  Administered 2016-05-05 – 2016-05-15 (×17): 0.5 mg via ORAL
  Filled 2016-05-05 (×18): qty 1

## 2016-05-05 MED ORDER — DEXTROSE-NACL 5-0.45 % IV SOLN
INTRAVENOUS | Status: AC
  Administered 2016-05-05: 14:00:00 via INTRAVENOUS

## 2016-05-05 MED ORDER — HALOPERIDOL LACTATE 5 MG/ML IJ SOLN
INTRAMUSCULAR | Status: AC
  Administered 2016-05-05: 2 mg via INTRAVENOUS
  Filled 2016-05-05: qty 1

## 2016-05-05 NOTE — Progress Notes (Signed)
Pt has received all available doses of PRN Ativan q6. At hour 4 following dose, becomes increasingly agitated, attempts to get out of bed, and does not follow commands. Swings limbs at staff, attempts to kick staff, and refuses to stay in the bed. Telesitter has notified RN of patient's unsafe behaviors >8 times, and RN has intervened each time.  Will continue to monitor.

## 2016-05-05 NOTE — Progress Notes (Signed)
In to assist patient with bathing, patient extremely agitated, combative, unable to complete bath at this time. Primary RN notified.

## 2016-05-05 NOTE — Consult Note (Signed)
[facility] Psych ED Progress Note  05/05/2016 12:00 PM Antonio Montoya  MRN:  [id number] Subjective/Objective:  Patient with history of intellectual disability who was admitted to medical floor due to hypothermia and delirium. Patient is sleeping and unable to provide history. However, some background information was obtained from his sisters who came to visit him in the hospital. They reports that patient had an head injury when he was a child and as a result had a behavior change. They reports that he was not able to live with the family since then, was always placed with other families who takes care of him. Sister also report that patient was evaluated for psychiatric problem at [facility] dis many years ago and was found to be suffering from intellectual disability but no mental illness. Also, nursing staff reports that patient is combative, confused  and gets easily agitated but he is unable to give any history himself.  Principal Problem: Acute delirium Diagnosis:   Patient Active Problem List   Diagnosis Date Noted  . Urinary retention [R33.9] 05/04/2016  . Thrombocytopenia ([place]) [D69.6] 05/04/2016  . Pressure injury of skin [L89.90] 05/02/2016  . Hypothermia [T68.XXXA] 05/02/2016  . Elevated LFTs [R79.89]   . Acute delirium [R41.0] 05/01/2016   Total Time spent with patient: 45 minutes  Past Psychiatric History: as above  Past Medical History:  Past Medical History:  Diagnosis Date  . MR (mental retardation)   . Schizophrenia ([place])    History reviewed. No pertinent surgical history. Family History:  Family History  Problem Relation Age of Onset  . Family history unknown: Yes   Family Psychiatric  History:  Social History:  History  Alcohol Use No     History  Drug Use No    Social History   Social History  . Marital status: Single    Spouse name: N/A  . Number of children: N/A  . Years of education: N/A   Social History Main Topics  . Smoking status: Never Smoker  .  Smokeless tobacco: Never Used  . Alcohol use No  . Drug use: No  . Sexual activity: No   Other Topics Concern  . None   Social History Narrative  . None    Sleep: excessive  Appetite:  Poor  Current Medications: Current Facility-Administered Medications  Medication Dose Route Frequency Provider Last Rate Last Dose  . acetaminophen (TYLENOL) tablet 650 mg  650 mg Oral Q6H PRN Rise Patience, MD       Or  . acetaminophen (TYLENOL) suppository 650 mg  650 mg Rectal Q6H PRN Rise Patience, MD      . asenapine (SAPHRIS) sublingual tablet 5 mg  5 mg Sublingual Q12H PRN Corena Pilgrim, MD      . Derrill Memo ON 05/06/2016] benztropine (COGENTIN) tablet 0.5 mg  0.5 mg Oral Daily Gaelan Glennon, MD      . carbamazepine (TEGRETOL) tablet 200 mg  200 mg Oral BID PC Khyre Germond, MD      . feeding supplement (ENSURE ENLIVE) (ENSURE ENLIVE) liquid 237 mL  237 mL Oral Q24H [name], MD      . haloperidol (HALDOL) tablet 5 mg  5 mg Oral BID Carly Sabo, MD      . lamoTRIgine (LAMICTAL) tablet 100 mg  100 mg Oral BID Rise Patience, MD   100 mg at 05/05/16 1026  . LORazepam (ATIVAN) injection 1 mg  1 mg Intravenous Q6H PRN Debbe Odea, MD   1 mg at 05/05/16 0430  .  LORazepam (ATIVAN) tablet 0.5 mg  0.5 mg Oral BID Shunna Mikaelian, MD      . ondansetron (ZOFRAN) tablet 4 mg  4 mg Oral Q6H PRN Rise Patience, MD       Or  . ondansetron (ZOFRAN) injection 4 mg  4 mg Intravenous Q6H PRN Rise Patience, MD      . pantoprazole (PROTONIX) EC tablet 40 mg  40 mg Oral Daily Rise Patience, MD   40 mg at 05/05/16 1026  . polycarbophil (FIBERCON) tablet 625 mg  625 mg Oral q morning - 10a Rise Patience, MD   625 mg at 05/05/16 1025  . potassium chloride (K-DUR,KLOR-CON) CR tablet 10 mEq  10 mEq Oral q morning - 10a Rise Patience, MD   10 mEq at 05/05/16 1026  . tamsulosin (FLOMAX) capsule 0.8 mg  0.8 mg Oral Daily Rise Patience, MD   0.8 mg at 05/05/16  1026    Lab Results:  Results for orders placed or performed during the hospital encounter of 05/01/16 (from the past 48 hour(s))  Comprehensive metabolic panel     Status: Abnormal   Collection Time: 05/03/16  1:53 PM  Result Value Ref Range   Sodium 144 135 - 145 mmol/L   Potassium 3.8 3.5 - 5.1 mmol/L   Chloride 113 (H) 101 - 111 mmol/L   CO2 26 22 - 32 mmol/L   Glucose, Bld 71 65 - 99 mg/dL   BUN 35 (H) 6 - 20 mg/dL   Creatinine, Ser 1.24 0.61 - 1.24 mg/dL   Calcium 9.2 8.9 - 10.3 mg/dL   Total Protein 5.5 (L) 6.5 - 8.1 g/dL   Albumin 2.8 (L) 3.5 - 5.0 g/dL   AST 107 (H) 15 - 41 U/L   ALT 69 (H) 17 - 63 U/L   Alkaline Phosphatase 80 38 - 126 U/L   Total Bilirubin 1.5 (H) 0.3 - 1.2 mg/dL   GFR calc non Af Amer 60 (L) >60 mL/min   GFR calc Af Amer >60 >60 mL/min    Comment: (NOTE) The eGFR has been calculated using the CKD EPI equation. This calculation has not been validated in all clinical situations. eGFR's persistently <60 mL/min signify possible Chronic Kidney Disease.    Anion gap 5 5 - 15  CBC     Status: Abnormal   Collection Time: 05/04/16  3:31 AM  Result Value Ref Range   WBC 7.0 4.0 - 10.5 K/uL   RBC 2.94 (L) 4.22 - 5.81 MIL/uL   Hemoglobin 9.5 (L) 13.0 - 17.0 g/dL   HCT 27.4 (L) 39.0 - 52.0 %   MCV 93.2 78.0 - 100.0 fL   MCH 32.3 26.0 - 34.0 pg   MCHC 34.7 30.0 - 36.0 g/dL   RDW 13.6 11.5 - 15.5 %   Platelets 77 (L) 150 - 400 K/uL    Comment: CONSISTENT WITH PREVIOUS RESULT  Glucose, capillary     Status: None   Collection Time: 05/04/16  6:36 AM  Result Value Ref Range   Glucose-Capillary 69 65 - 99 mg/dL  Glucose, capillary     Status: Abnormal   Collection Time: 05/04/16  6:56 AM  Result Value Ref Range   Glucose-Capillary 127 (H) 65 - 99 mg/dL  Glucose, capillary     Status: Abnormal   Collection Time: 05/04/16  5:32 PM  Result Value Ref Range   Glucose-Capillary 22 (LL) 65 - 99 mg/dL   Comment 1 JUICE  Glucose, capillary     Status:  Abnormal   Collection Time: 05/04/16  5:51 PM  Result Value Ref Range   Glucose-Capillary 112 (H) 65 - 99 mg/dL  Glucose, capillary     Status: None   Collection Time: 05/04/16  7:57 PM  Result Value Ref Range   Glucose-Capillary 75 65 - 99 mg/dL   Comment 1 Notify RN    Comment 2 Document in Chart   Glucose, capillary     Status: None   Collection Time: 05/04/16 11:59 PM  Result Value Ref Range   Glucose-Capillary 86 65 - 99 mg/dL   Comment 1 Notify RN   Comprehensive metabolic panel     Status: Abnormal   Collection Time: 05/05/16  3:58 AM  Result Value Ref Range   Sodium 148 (H) 135 - 145 mmol/L   Potassium 3.9 3.5 - 5.1 mmol/L   Chloride 112 (H) 101 - 111 mmol/L   CO2 30 22 - 32 mmol/L   Glucose, Bld 70 65 - 99 mg/dL   BUN 28 (H) 6 - 20 mg/dL   Creatinine, Ser 1.33 (H) 0.61 - 1.24 mg/dL   Calcium 9.4 8.9 - 10.3 mg/dL   Total Protein 6.2 (L) 6.5 - 8.1 g/dL   Albumin 2.9 (L) 3.5 - 5.0 g/dL   AST 79 (H) 15 - 41 U/L   ALT 59 17 - 63 U/L   Alkaline Phosphatase 88 38 - 126 U/L   Total Bilirubin 0.8 0.3 - 1.2 mg/dL   GFR calc non Af Amer 55 (L) >60 mL/min   GFR calc Af Amer >60 >60 mL/min    Comment: (NOTE) The eGFR has been calculated using the CKD EPI equation. This calculation has not been validated in all clinical situations. eGFR's persistently <60 mL/min signify possible Chronic Kidney Disease.    Anion gap 6 5 - 15  CBC     Status: Abnormal   Collection Time: 05/05/16  3:58 AM  Result Value Ref Range   WBC 6.9 4.0 - 10.5 K/uL   RBC 3.02 (L) 4.22 - 5.81 MIL/uL   Hemoglobin 9.7 (L) 13.0 - 17.0 g/dL   HCT 28.2 (L) 39.0 - 52.0 %   MCV 93.4 78.0 - 100.0 fL   MCH 32.1 26.0 - 34.0 pg   MCHC 34.4 30.0 - 36.0 g/dL   RDW 13.4 11.5 - 15.5 %   Platelets 82 (L) 150 - 400 K/uL    Comment: CONSISTENT WITH PREVIOUS RESULT    Blood Alcohol level:  Lab Results  Component Value Date   ETH <5 04/01/2016    Physical Findings: AIMS:  , ,  ,  ,    CIWA:    COWS:      Musculoskeletal: Strength & Muscle Tone: not tested Gait & Station: unable to stand Patient leans: N/A  Psychiatric Specialty Exam: Physical Exam  Psychiatric: Thought content normal. His affect is blunt. His speech is delayed. He is agitated, slowed, withdrawn and combative. Cognition and memory are impaired. He expresses impulsivity.    Review of Systems  Unable to perform ROS: Other    Blood pressure (!) 144/40, pulse (!) 56, temperature (!) 95.6 F (35.3 C), temperature source Rectal, resp. rate 19, height 6' (1.829 m), weight 71 kg (156 lb 8.4 oz), SpO2 94 %.Body mass index is 21.23 kg/m.  General Appearance: Casual  Eye Contact:  Minimal  Speech:  Slow  Volume:  Decreased  Mood:  Irritable  Affect:  Full Range  Thought Process:  Disorganized  Orientation:  Other:  disoriented  Thought Content:  Illogical  Suicidal Thoughts:  unable to assess  Homicidal Thoughts:  unable to assess  Memory:  unable to assess  Judgement:  Impaired  Insight:  Lacking  Psychomotor Activity:  Restlessness  Concentration:  Concentration: Poor and Attention Span: Poor  Recall:  Poor  Fund of Knowledge:  Poor  Language:  Fair  Akathisia:  No  Handed:  Right  AIMS (if indicated):     Assets:  Housing Social Support  ADL's:  Impaired  Cognition:  Impaired,  Moderate  Sleep:         Treatment Plan Summary: Daily contact with patient to assess and evaluate symptoms and progress in treatment and Medication management  Discontinue Oxcarbamazepine Start Tegretol 200 mg bid for agitation. Reduce Haldol to 5 mg bid due excessive sedation. Change Lorazepam to 0.5 mg bid for agitation. Start Saphris 5 mg S/L bid as needed for agitation Patient does not meet criteria for inpatient admission Patient needed to be monitored for the EKG for possible QTC prolongation  Corena Pilgrim, MD 05/05/2016, 12:00 PM

## 2016-05-05 NOTE — Progress Notes (Signed)
Bair hugger applied per order, due to rectal temp of 95.6, primary RN Selena Batten(Kim) aware.

## 2016-05-05 NOTE — Progress Notes (Addendum)
PROGRESS NOTE    Antonio Montoya   RUE:[id number]  DOB: 19-Feb-1951  DOA: 05/01/2016 PCP: Shayne Alken, MD (Inactive)   Brief Narrative:  Antonio Montoya is a 67 y.o. male with DM diet controlled, mental retardation and schizophrenia had come to the ER 2 days ago for urinary retention (bladder scan showed > 1 L urine and caretaker stated he had not urinated in 18 hrs). He had Foley catheter placed and sent back to group home.  Patient is unable to give history- per ER HPI, he came if in for hematuria after pulling on his foley cath. He has beem increasingly agitated for a few wks per caregiver. Caregiver stated pt recently had a fit and broke several items including a glass ~ 3 days prior to admission & had swelling to the left hand on the day of admission .  In the ER patient had to be sedated with Ativan. He was found to have hypothermia with temp of 95.7.  This is the 5th visit to the ER since Jan of this year.  1/21- stumbled, fell, abrasion to forehead 2/4- left subconjunctival hemorrhage 2/16- off balance- head CT ok  2/16- U retention  Subjective: Non-verbal  Assessment & Plan:   Principal Problem:   Acute delirium, hypothermia  - due to psych issues and psych medications- psychiatry managing this  - his group home does not want him back -as the foley may be the cause of his increased agitation,  I asked RN to pull foley  - agitation did not improve- foley did have to be re-inserted and psych consulted for assistance - the following changed made thus far:  decreased Haldol from 25 to 15 QHS and now Haldol 5 mg BID Added 1mg  Ativan QHS  now changed to 0.5 mg BID Lamictal decreased from 125 in AM and 150 in PM  to 100  BID  Risperdal ( which was given routine and PRN at home) stopped-  Oxcarbazine stopped and Tegretol 200 mg BID added  Cogentin decreased from 1mg  daily to 0.5 mg daily Saphris 5mg  Q12 PRN for agitation added - following QTc  Active Problems:  U retention/  AKI - cont Flomax- foley taken out but continue to retain and foley replaced. - per psych, his psych meds may be causing this- medications changes to be made by psych - Cr rising today- not eating well- will order slow IVF  Swelling of hand - xray does not show fracture - swelling due to traumatic injury (based on H and P)   --  given Clinda last in ER and then zosyn & Vanc- Vanc stopped 2/21- hand swelling not improving therefore I doubt it is cellulitis and is likely a traumatic injury- hand is non tender to touch - stopped Zosyn 2/23    Hypothermia - no source of sepsis found for this- no endocrine cause found- cortisol and thyroid functions are normal - blood cultures negative, UA negative - suspect poor thermoregulation due to antipsychotics- have spoken with psych about this-  following temps closely  Elevated LFTs -  CT abd shows no signs of infection- he has a mild CBD dilatation- doubt this is significant - suspect LFTs are elevated due to psych medications/ hypothermia - improving  Hypoglycemia - sugar 22 on 2/23- following - may not be accurate as he has had no other drops < 50  Thrombocytopenia - noted on 2/16 and has progressed- medication related?? Due to Zosyn? - stopped Zosyn on 2/23- - following daily  Pressure injury of skin - left buttock- stage 2  DVT prophylaxis: SCDs Code Status: Full code Family Communication:  Disposition Plan: follow in SDU due to hypothermia Consultants:   psych Procedures:    Antimicrobials:  Anti-infectives    Start     Dose/Rate Route Frequency Ordered Stop   05/02/16 2000  vancomycin (VANCOCIN) IVPB 750 mg/150 ml premix  Status:  Discontinued     750 mg 150 mL/hr over 60 Minutes Intravenous Every 12 hours 05/02/16 0643 05/03/16 0805   05/02/16 1400  piperacillin-tazobactam (ZOSYN) IVPB 3.375 g  Status:  Discontinued     3.375 g 12.5 mL/hr over 240 Minutes Intravenous Every 8 hours 05/02/16 0532 05/04/16 1125   05/02/16  0415  piperacillin-tazobactam (ZOSYN) IVPB 3.375 g     3.375 g 100 mL/hr over 30 Minutes Intravenous  Once 05/02/16 0407 05/02/16 0553   05/02/16 0415  vancomycin (VANCOCIN) IVPB 1000 mg/200 mL premix     1,000 mg 200 mL/hr over 60 Minutes Intravenous  Once 05/02/16 0407 05/02/16 0745   05/01/16 2200  clindamycin (CLEOCIN) IVPB 600 mg     600 mg 100 mL/hr over 30 Minutes Intravenous  Once 05/01/16 2148 05/02/16 0010       Objective: Vitals:   05/05/16 0444 05/05/16 0600 05/05/16 0800 05/05/16 0830  BP: (!) 114/49 140/82 (!) 144/40   Pulse:      Resp: 15 11 19    Temp: (!) 96.4 F (35.8 C)  (!) 95.4 F (35.2 C) (!) 95.6 F (35.3 C)  TempSrc: Axillary  Axillary Rectal  SpO2: 94%     Weight:      Height:        Intake/Output Summary (Last 24 hours) at 05/05/16 1224 Last data filed at 05/05/16 0500  Gross per 24 hour  Intake               40 ml  Output             1000 ml  Net             -960 ml   Filed Weights   05/02/16 0640  Weight: 71 kg (156 lb 8.4 oz)    Examination: General exam: alert, nonverbal, does not follow commands HEENT: PERRLA, oral mucosa moist, no sclera icterus or thrush Respiratory system: Clear to auscultation. Respiratory effort normal. Cardiovascular system: S1 & S2 heard, RRR.  No murmurs  Gastrointestinal system: Abdomen soft, non-tender, nondistended. Normal bowel sound. No organomegaly Central nervous system: moves all extremities Extremities: No cyanosis, clubbing- swelling of left hand improving but not resolved Skin: No rashes or ulcers Psychiatry:  Intermittently agitated requiring sedation    Data Reviewed: I have personally reviewed following labs and imaging studies  CBC:  Recent Labs Lab 05/01/16 1930 05/02/16 0819 05/03/16 0848 05/04/16 0331 05/05/16 0358  WBC 10.8* 12.8* 9.5 7.0 6.9  NEUTROABS 9.9* 11.4*  --   --   --   HGB 10.5* 10.8* 9.6* 9.5* 9.7*  HCT 30.1* 31.2* 27.9* 27.4* 28.2*  MCV 93.8 92.0 91.5 93.2 93.4    PLT 78* 79* 61* 77* 82*   Basic Metabolic Panel:  Recent Labs Lab 05/02/16 0819 05/02/16 1854 05/03/16 0848 05/03/16 1353 05/05/16 0358  NA 143  144 143 146* 144 148*  K 4.2  4.3 4.1 3.8 3.8 3.9  CL 110  109 111 112* 113* 112*  CO2 28  30 29 29 26 30   GLUCOSE 66  72 89 80 71 70  BUN 44*  44* 46* 38* 35* 28*  CREATININE 1.23  1.29* 1.39* 1.30* 1.24 1.33*  CALCIUM 9.6  9.5 9.3 9.5 9.2 9.4  MG 2.4  --   --   --   --    GFR: Estimated Creatinine Clearance: 56.3 mL/min (by C-G formula based on SCr of 1.33 mg/dL (H)). Liver Function Tests:  Recent Labs Lab 05/02/16 0819 05/02/16 1854 05/03/16 1353 05/05/16 0358  AST 136* 133* 107* 79*  ALT 84* 77* 69* 59  ALKPHOS 92 88 80 88  BILITOT 0.6 0.9 1.5* 0.8  PROT 5.9* 5.6* 5.5* 6.2*  ALBUMIN 3.1* 2.9* 2.8* 2.9*   No results for input(s): LIPASE, AMYLASE in the last 168 hours.  Recent Labs Lab 05/01/16 1930  AMMONIA 24   Coagulation Profile:  Recent Labs Lab 05/02/16 0819  INR 1.01   Cardiac Enzymes: No results for input(s): CKTOTAL, CKMB, CKMBINDEX, TROPONINI in the last 168 hours. BNP (last 3 results) No results for input(s): PROBNP in the last 8760 hours. HbA1C: No results for input(s): HGBA1C in the last 72 hours. CBG:  Recent Labs Lab 05/04/16 0656 05/04/16 1732 05/04/16 1751 05/04/16 1957 05/04/16 2359  GLUCAP 127* 22* 112* 75 86   Lipid Profile: No results for input(s): CHOL, HDL, LDLCALC, TRIG, CHOLHDL, LDLDIRECT in the last 72 hours. Thyroid Function Tests: No results for input(s): TSH, T4TOTAL, FREET4, T3FREE, THYROIDAB in the last 72 hours. Anemia Panel: No results for input(s): VITAMINB12, FOLATE, FERRITIN, TIBC, IRON, RETICCTPCT in the last 72 hours. Urine analysis:    Component Value Date/Time   COLORURINE YELLOW 05/01/2016 0025   APPEARANCEUR CLOUDY (A) 05/01/2016 0025   LABSPEC 1.015 05/01/2016 0025   PHURINE 7.0 05/01/2016 0025   GLUCOSEU NEGATIVE 05/01/2016 0025   HGBUR  NEGATIVE 05/01/2016 0025   BILIRUBINUR NEGATIVE 05/01/2016 0025   KETONESUR NEGATIVE 05/01/2016 0025   PROTEINUR NEGATIVE 05/01/2016 0025   UROBILINOGEN 1.0 01/16/2012 1957   NITRITE NEGATIVE 05/01/2016 0025   LEUKOCYTESUR NEGATIVE 05/01/2016 0025   Sepsis Labs: @LABRCNTIP (procalcitonin:4,lacticidven:4) ) Recent Results (from the past 240 hour(s))  MRSA PCR Screening     Status: None   Collection Time: 05/02/16  3:08 AM  Result Value Ref Range Status   MRSA by PCR NEGATIVE NEGATIVE Final    Comment:        The GeneXpert MRSA Assay (FDA approved for NASAL specimens only), is one component of a comprehensive MRSA colonization surveillance program. It is not intended to diagnose MRSA infection nor to guide or monitor treatment for MRSA infections.   Culture, blood (x 2)     Status: None (Preliminary result)   Collection Time: 05/02/16  8:19 AM  Result Value Ref Range Status   Specimen Description BLOOD LEFT ARM  Final   Special Requests IN PEDIATRIC BOTTLE 1CC  Final   Culture   Final    NO GROWTH 3 DAYS Performed at [facility], [address] [place], [place] [address]    Report Status PENDING  Incomplete  Culture, blood (x 2)     Status: None (Preliminary result)   Collection Time: 05/02/16  8:19 AM  Result Value Ref Range Status   Specimen Description BLOOD LEFT ARM  Final   Special Requests IN PEDIATRIC BOTTLE 2CC  Final   Culture   Final    NO GROWTH 3 DAYS Performed at [facility], [address] [place], [place] [address]    Report Status PENDING  Incomplete  Radiology Studies: Ct Abdomen W Contrast  Result Date: 05/03/2016 CLINICAL DATA:  Urinary retention and hematuria after pulling out a Foley catheter. EXAM: CT ABDOMEN WITH CONTRAST TECHNIQUE: Multidetector CT imaging of the abdomen was performed using the standard protocol following bolus administration of intravenous contrast. CONTRAST:  100mL ISOVUE-300 IOPAMIDOL (ISOVUE-300)  INJECTION 61% COMPARISON:  CT scan from 2012. FINDINGS: Lower chest: The lung bases demonstrate bilateral effusions and bibasilar infiltrates. The heart is enlarged but stable. There is tortuosity and mild ectasia of the thoracic aorta. Hepatobiliary: Diffuse fatty infiltration of the liver but no focal hepatic lesions. The gallbladder is grossly normal. Mild common bile duct dilatation without obvious cause. Maximum diameter is 8 mm. Recommend correlation with liver function studies. MRCP would not be an option for this patient. Pancreas: Grossly normal. Spleen: Grossly normal. Adrenals/Urinary Tract: The adrenal glands and kidneys are grossly normal. No hydronephrosis. Small renal cysts. Stomach/Bowel: Grossly normal.  Moderate stool in the colon. Vascular/Lymphatic: The aorta and branch vessels are patent. 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 Possible web or focal dissection in the left iliac artery but it is not completely covered. Other: No ascites. Musculoskeletal: No significant bony findings. IMPRESSION: Limited examination due to patient motion. Small bilateral pleural effusions and bibasilar infiltrates. Mild common bile duct and cystic duct dilatation without obvious cause. The common bile duct tapers normally to the duodenum. Recommend correlation with liver function studies. Patient would not be a candidate for MRCP. No acute abdominal findings, mass lesions or adenopathy. Electronically Signed   By: Rudie MeyerP.  Gallerani M.D.   On: 05/03/2016 15:17      Scheduled Meds: . [START ON 05/06/2016] benztropine  0.5 mg Oral Daily  . carbamazepine  200 mg Oral BID PC  . feeding supplement (ENSURE ENLIVE)  237 mL Oral Q24H  . haloperidol  5 mg Oral BID  . lamoTRIgine  100 mg Oral BID  . LORazepam  0.5 mg Oral BID  . pantoprazole  40 mg Oral Daily  . polycarbophil  625 mg Oral q morning - 10a  . potassium chloride  10 mEq Oral q morning - 10a  . tamsulosin  0.8  mg Oral Daily   Continuous Infusions:    LOS: 3 days    Time spent in minutes: 35    Tametria Aho, MD [facility] Pager: [email or web address] Password [email or web address] 05/05/2016, 12:24 PM

## 2016-05-05 NOTE — Progress Notes (Signed)
Patient combative attempting to get out of bed. Optician, dispensingTook writer and 3 other staff member to help restrain patient from kicking and getting out of bed. Notified Dr. Butler Denmarkizwan and new orders received

## 2016-05-06 LAB — CBC
HCT: 27.4 % — ABNORMAL LOW (ref 39.0–52.0)
Hemoglobin: 9.6 g/dL — ABNORMAL LOW (ref 13.0–17.0)
MCH: 32.5 pg (ref 26.0–34.0)
MCHC: 35 g/dL (ref 30.0–36.0)
MCV: 92.9 fL (ref 78.0–100.0)
PLATELETS: 103 10*3/uL — AB (ref 150–400)
RBC: 2.95 MIL/uL — ABNORMAL LOW (ref 4.22–5.81)
RDW: 13.4 % (ref 11.5–15.5)
WBC: 7.6 10*3/uL (ref 4.0–10.5)

## 2016-05-06 LAB — GLUCOSE, CAPILLARY
GLUCOSE-CAPILLARY: 63 mg/dL — AB (ref 65–99)
GLUCOSE-CAPILLARY: 101 mg/dL — AB (ref 65–99)
GLUCOSE-CAPILLARY: 96 mg/dL (ref 65–99)
Glucose-Capillary: 64 mg/dL — ABNORMAL LOW (ref 65–99)

## 2016-05-06 LAB — BASIC METABOLIC PANEL
Anion gap: 5 (ref 5–15)
BUN: 23 mg/dL — AB (ref 6–20)
CO2: 30 mmol/L (ref 22–32)
CREATININE: 1.18 mg/dL (ref 0.61–1.24)
Calcium: 9.4 mg/dL (ref 8.9–10.3)
Chloride: 112 mmol/L — ABNORMAL HIGH (ref 101–111)
GFR calc Af Amer: >60 mL/min (ref >60)
Glucose, Bld: 82 mg/dL (ref 65–99)
Potassium: 3.7 mmol/L (ref 3.5–5.1)
SODIUM: 147 mmol/L — AB (ref 135–145)

## 2016-05-06 NOTE — Clinical Social Work Note (Signed)
Review of chart:  Pt seen 05/05/16 by Psychiatrist. At this time- patient is not meeting criteria for admission to inpatient facility. Continues to monitor daily and meds are being adjusted.  Current plan is for patient to return to group home when stable.  Unsure if facility can manage patient if he has a catheter at d/c and will need to follow up with MD on this issue.  CSW services will continue to monitor for possible date of stability.  Lorri Frederickonna T. Jaci LazierCrowder, [place]CS[place] [phone]  (weekend coverage)

## 2016-05-06 NOTE — Progress Notes (Signed)
Last rectal temp on pt. was 94.7. Placed warming blanket on pt. 4 times only to have pt. remove it immediately.

## 2016-05-06 NOTE — Progress Notes (Addendum)
PROGRESS NOTE    Antonio Montoya   ZOX:[id number]  DOB: 14-Jan-1951  DOA: 05/01/2016 PCP: Shayne Alken, MD (Inactive)   Brief Narrative:  Antonio Montoya is a 66 y.o. male with DM diet controlled, mental retardation and schizophrenia had come to the ER 2 days ago for urinary retention (bladder scan showed > 1 L urine and caretaker stated he had not urinated in 18 hrs). He had Foley catheter placed and sent back to group home.  Patient is unable to give history- per ER HPI, he came if in for hematuria after pulling on his foley cath. He has beem increasingly agitated for a few wks per caregiver. Caregiver stated pt recently had a fit and broke several items including a glass ~ 3 days prior to admission & had swelling to the left hand on the day of admission .  In the ER patient had to be sedated with Ativan. He was found to have hypothermia with temp of 95.7.  This is the 5th visit to the ER since Jan of this year.  1/21- stumbled, fell, abrasion to forehead 2/4- left subconjunctival hemorrhage 2/16- off balance- head CT ok  2/16- U retention  Subjective: Non-verbal  Assessment & Plan:   Principal Problem:   Acute delirium, hypothermia, Urinary retention - due to psych issues and psych medications- psychiatry managing this  - his group home does not want him back -as the foley may be the cause of his increased agitation,  I asked RN to pull foley  - agitation did not improve- foley did have to be re-inserted and psych consulted for assistance - the following changed made thus far:  decreased Haldol from 25 to 15 QHS and now Haldol 5 mg BID Added 1mg  Ativan QHS  now changed to 0.5 mg BID Lamictal decreased from 125 in AM and 150 in PM  to 100  BID  Risperdal ( which was given routine and PRN at home) stopped-  Oxcarbazine stopped and Tegretol 200 mg BID added  Cogentin decreased from 1mg  daily to 0.5 mg daily Saphris 5mg  Q12 PRN for agitation added - following QTc - if hypothermia  resolves, can transfer out of SDU  Active Problems:  U retention/ AKI - cont Flomax- foley taken out but continue to retain and foley replaced. - per psych, his psych meds may be causing this- medications changes to be made by psych- see above - Cr 2/24 rose despite foley- may not be drinking well- improved with slow IVF  Swelling of hand - xray does not show fracture  --initially suspected to be cellulitis-  given Clinda last in ER and then zosyn & Vanc- Vanc stopped 2/21- hand swelling not improving therefore I doubt it is cellulitis and is likely a traumatic injury-  (based on H and P) -  hand is non tender to touch - stopped Zosyn 2/23 - swelling has resolved    Hypothermia - no source of sepsis found for this- no endocrine cause found- cortisol and thyroid functions are normal - blood cultures negative, UA negative - suspect poor thermoregulation due to antipsychotics- have spoken with psych about this-  following temps closely  Elevated LFTs -  CT abd shows no signs of infection- he has a mild CBD dilatation- doubt this is significant - suspect LFTs are elevated due to psych medications/ hypothermia  - improving  Hypoglycemia - sugar 22 on 2/23- following - may not be accurate as he has had no other drops < 50- will stop checking  Thrombocytopenia - noted on 2/16 and has progressed- medication related?? Due to Zosyn? - stopped Zosyn on 2/23- - following daily- improving    Pressure injury of skin Pressure Injury 05/01/16 Stage II - left buttock- Partial thickness loss of dermis presenting as a shallow open ulcer with a red, pink wound bed without slough. irregular in size - white granulation tissue to area.     DVT prophylaxis: SCDs Code Status: Full code Family Communication:  Disposition Plan: follow in SDU due to hypothermia Consultants:   psych Procedures:    Antimicrobials:  Anti-infectives    Start     Dose/Rate Route Frequency Ordered Stop   05/02/16  2000  vancomycin (VANCOCIN) IVPB 750 mg/150 ml premix  Status:  Discontinued     750 mg 150 mL/hr over 60 Minutes Intravenous Every 12 hours 05/02/16 0643 05/03/16 0805   05/02/16 1400  piperacillin-tazobactam (ZOSYN) IVPB 3.375 g  Status:  Discontinued     3.375 g 12.5 mL/hr over 240 Minutes Intravenous Every 8 hours 05/02/16 0532 05/04/16 1125   05/02/16 0415  piperacillin-tazobactam (ZOSYN) IVPB 3.375 g     3.375 g 100 mL/hr over 30 Minutes Intravenous  Once 05/02/16 0407 05/02/16 0553   05/02/16 0415  vancomycin (VANCOCIN) IVPB 1000 mg/200 mL premix     1,000 mg 200 mL/hr over 60 Minutes Intravenous  Once 05/02/16 0407 05/02/16 0745   05/01/16 2200  clindamycin (CLEOCIN) IVPB 600 mg     600 mg 100 mL/hr over 30 Minutes Intravenous  Once 05/01/16 2148 05/02/16 0010       Objective: Vitals:   05/06/16 0416 05/06/16 0628 05/06/16 0700 05/06/16 0800  BP:      Pulse: (!) 59 (!) 59    Resp: (!) 24 (!) 24    Temp: 97.5 F (36.4 C) 97.7 F (36.5 C) 99.6 F (37.6 C) 98.4 F (36.9 C)  TempSrc: Axillary Axillary Axillary Axillary  SpO2: 95% 97%    Weight:      Height:        Intake/Output Summary (Last 24 hours) at 05/06/16 1048 Last data filed at 05/06/16 0730  Gross per 24 hour  Intake            777.5 ml  Output             1200 ml  Net           -422.5 ml   Filed Weights   05/02/16 0640  Weight: 71 kg (156 lb 8.4 oz)    Examination: General exam: alert, nonverbal, does not follow commands HEENT: PERRLA, oral mucosa moist, no sclera icterus or thrush Respiratory system: Clear to auscultation. Respiratory effort normal. Cardiovascular system: S1 & S2 heard, RRR.  No murmurs  Gastrointestinal system: Abdomen soft, non-tender, nondistended. Normal bowel sound. No organomegaly Central nervous system: moves all extremities Extremities: No cyanosis, clubbing- swelling of left hand improving but not resolved Skin: No rashes or ulcers Psychiatry:  Intermittently agitated  requiring sedation    Data Reviewed: I have personally reviewed following labs and imaging studies  CBC:  Recent Labs Lab 05/01/16 1930 05/02/16 0819 05/03/16 0848 05/04/16 0331 05/05/16 0358 05/06/16 0314  WBC 10.8* 12.8* 9.5 7.0 6.9 7.6  NEUTROABS 9.9* 11.4*  --   --   --   --   HGB 10.5* 10.8* 9.6* 9.5* 9.7* 9.6*  HCT 30.1* 31.2* 27.9* 27.4* 28.2* 27.4*  MCV 93.8 92.0 91.5 93.2 93.4 92.9  PLT 78* 79* 61* 77* 82*  103*   Basic Metabolic Panel:  Recent Labs Lab 05/02/16 0819 05/02/16 1854 05/03/16 0848 05/03/16 1353 05/05/16 0358 05/06/16 0314  NA 143  144 143 146* 144 148* 147*  K 4.2  4.3 4.1 3.8 3.8 3.9 3.7  CL 110  109 111 112* 113* 112* 112*  CO2 28  30 29 29 26 30 30   GLUCOSE 66  72 89 80 71 70 82  BUN 44*  44* 46* 38* 35* 28* 23*  CREATININE 1.23  1.29* 1.39* 1.30* 1.24 1.33* 1.18  CALCIUM 9.6  9.5 9.3 9.5 9.2 9.4 9.4  MG 2.4  --   --   --   --   --    GFR: Estimated Creatinine Clearance: 63.5 mL/min (by C-G formula based on SCr of 1.18 mg/dL). Liver Function Tests:  Recent Labs Lab 05/02/16 0819 05/02/16 1854 05/03/16 1353 05/05/16 0358  AST 136* 133* 107* 79*  ALT 84* 77* 69* 59  ALKPHOS 92 88 80 88  BILITOT 0.6 0.9 1.5* 0.8  PROT 5.9* 5.6* 5.5* 6.2*  ALBUMIN 3.1* 2.9* 2.8* 2.9*   No results for input(s): LIPASE, AMYLASE in the last 168 hours.  Recent Labs Lab 05/01/16 1930  AMMONIA 24   Coagulation Profile:  Recent Labs Lab 05/02/16 0819  INR 1.01   Cardiac Enzymes: No results for input(s): CKTOTAL, CKMB, CKMBINDEX, TROPONINI in the last 168 hours. BNP (last 3 results) No results for input(s): PROBNP in the last 8760 hours. HbA1C: No results for input(s): HGBA1C in the last 72 hours. CBG:  Recent Labs Lab 05/05/16 1456 05/05/16 1717 05/05/16 2156 05/06/16 0638 05/06/16 0657  GLUCAP 91 101* 105* 63* 64*   Lipid Profile: No results for input(s): CHOL, HDL, LDLCALC, TRIG, CHOLHDL, LDLDIRECT in the last 72  hours. Thyroid Function Tests: No results for input(s): TSH, T4TOTAL, FREET4, T3FREE, THYROIDAB in the last 72 hours. Anemia Panel: No results for input(s): VITAMINB12, FOLATE, FERRITIN, TIBC, IRON, RETICCTPCT in the last 72 hours. Urine analysis:    Component Value Date/Time   COLORURINE YELLOW 05/01/2016 0025   APPEARANCEUR CLOUDY (A) 05/01/2016 0025   LABSPEC 1.015 05/01/2016 0025   PHURINE 7.0 05/01/2016 0025   GLUCOSEU NEGATIVE 05/01/2016 0025   HGBUR NEGATIVE 05/01/2016 0025   BILIRUBINUR NEGATIVE 05/01/2016 0025   KETONESUR NEGATIVE 05/01/2016 0025   PROTEINUR NEGATIVE 05/01/2016 0025   UROBILINOGEN 1.0 01/16/2012 1957   NITRITE NEGATIVE 05/01/2016 0025   LEUKOCYTESUR NEGATIVE 05/01/2016 0025   Sepsis Labs: @LABRCNTIP (procalcitonin:4,lacticidven:4) ) Recent Results (from the past 240 hour(s))  MRSA PCR Screening     Status: None   Collection Time: 05/02/16  3:08 AM  Result Value Ref Range Status   MRSA by PCR NEGATIVE NEGATIVE Final    Comment:        The GeneXpert MRSA Assay (FDA approved for NASAL specimens only), is one component of a comprehensive MRSA colonization surveillance program. It is not intended to diagnose MRSA infection nor to guide or monitor treatment for MRSA infections.   Culture, blood (x 2)     Status: None (Preliminary result)   Collection Time: 05/02/16  8:19 AM  Result Value Ref Range Status   Specimen Description BLOOD LEFT ARM  Final   Special Requests IN PEDIATRIC BOTTLE 1CC  Final   Culture   Final    NO GROWTH 3 DAYS Performed at [facility], [address], [place], [place] [address]    Report Status PENDING  Incomplete  Culture, blood (x  2)     Status: None (Preliminary result)   Collection Time: 05/02/16  8:19 AM  Result Value Ref Range Status   Specimen Description BLOOD LEFT ARM  Final   Special Requests IN PEDIATRIC BOTTLE 2CC  Final   Culture   Final    NO GROWTH 3 DAYS Performed at [facility],  [address] [place], [place] [address]    Report Status PENDING  Incomplete         Radiology Studies: No results found.    Scheduled Meds: . benztropine  0.5 mg Oral Daily  . carbamazepine  200 mg Oral BID PC  . feeding supplement (ENSURE ENLIVE)  237 mL Oral Q24H  . haloperidol  5 mg Oral BID  . lamoTRIgine  100 mg Oral BID  . LORazepam  0.5 mg Oral BID  . pantoprazole  40 mg Oral Daily  . polycarbophil  625 mg Oral q morning - 10a  . potassium chloride  10 mEq Oral q morning - 10a  . tamsulosin  0.8 mg Oral Daily   Continuous Infusions:    LOS: 4 days    Time spent in minutes: 35    Kewon Statler, MD [facility] Pager: [email or web address] Password [facility] 05/06/2016, 10:48 AM

## 2016-05-07 DIAGNOSIS — F203 Undifferentiated schizophrenia: Secondary | ICD-10-CM

## 2016-05-07 LAB — BASIC METABOLIC PANEL
Anion gap: 5 (ref 5–15)
BUN: 21 mg/dL — AB (ref 6–20)
CALCIUM: 9.3 mg/dL (ref 8.9–10.3)
CHLORIDE: 111 mmol/L (ref 101–111)
CO2: 28 mmol/L (ref 22–32)
CREATININE: 1.17 mg/dL (ref 0.61–1.24)
GFR calc Af Amer: >60 mL/min (ref >60)
GFR calc non Af Amer: >60 mL/min (ref >60)
GLUCOSE: 73 mg/dL (ref 65–99)
Potassium: 4 mmol/L (ref 3.5–5.1)
Sodium: 144 mmol/L (ref 135–145)

## 2016-05-07 LAB — CULTURE, BLOOD (ROUTINE X 2)
CULTURE: NO GROWTH
Culture: NO GROWTH

## 2016-05-07 LAB — GLUCOSE, CAPILLARY
GLUCOSE-CAPILLARY: 99 mg/dL (ref 65–99)
Glucose-Capillary: 101 mg/dL — ABNORMAL HIGH (ref 65–99)
Glucose-Capillary: 85 mg/dL (ref 65–99)

## 2016-05-07 LAB — CBC
HEMATOCRIT: 30.1 % — AB (ref 39.0–52.0)
HEMOGLOBIN: 10.4 g/dL — AB (ref 13.0–17.0)
MCH: 31.9 pg (ref 26.0–34.0)
MCHC: 34.6 g/dL (ref 30.0–36.0)
MCV: 92.3 fL (ref 78.0–100.0)
Platelets: 137 10*3/uL — ABNORMAL LOW (ref 150–400)
RBC: 3.26 MIL/uL — ABNORMAL LOW (ref 4.22–5.81)
RDW: 13.2 % (ref 11.5–15.5)
WBC: 7.6 10*3/uL (ref 4.0–10.5)

## 2016-05-07 MED ORDER — ENSURE ENLIVE PO LIQD
237.0000 mL | Freq: Two times a day (BID) | ORAL | Status: DC
Start: 2016-05-07 — End: 2016-05-24
  Administered 2016-05-07 – 2016-05-24 (×22): 237 mL via ORAL

## 2016-05-07 NOTE — Progress Notes (Signed)
Pt still has not voided since foley pulled this am at 1000. Bladder scan showed 536cc. On call notified, foley ordered. Tasheka Houseman, Lavone OrnSARA K, RN

## 2016-05-07 NOTE — Progress Notes (Signed)
PROGRESS NOTE    Antonio Montoya   ZOX:[id number]  DOB: 1950/10/25  DOA: 05/01/2016 PCP: Shayne Alken, MD (Inactive)   Brief Narrative:  Antonio Montoya is a 66 y.o. male with DM diet controlled, mental retardation and schizophrenia had come to the ER 2 days ago for urinary retention (bladder scan showed > 1 L urine and caretaker stated he had not urinated in 18 hrs). He had Foley catheter placed and sent back to group home.  Patient is unable to give history- per ER HPI, he came if in for hematuria after pulling on his foley cath. He has beem increasingly agitated for a few wks per caregiver. Caregiver stated pt recently had a fit and broke several items including a glass ~ 3 days prior to admission & had swelling to the left hand on the day of admission .  In the ER patient had to be sedated with Ativan. He was found to have hypothermia with temp of 95.7.  This is the 5th visit to the ER since Jan of this year.  1/21- stumbled, fell, abrasion to forehead 2/4- left subconjunctival hemorrhage 2/16- off balance- head CT ok  2/16- U retention  Subjective: Non-verbal  Assessment & Plan:   Principal Problem:   Acute delirium, hypothermia, Urinary retention - due to psych issues and psych medications- psychiatry managing this  - his group home does not want him back -as the foley may be the cause of his increased agitation,  I asked RN to pull foley  - agitation did not improve- foley did have to be re-inserted due to retention and psych consulted for assistance - the following changed made thus far:  decreased Haldol from 25 to 15 QHS and now Haldol 5 mg BID Added 1mg  Ativan QHS  now changed to 0.5 mg BID Lamictal decreased from 125 in AM and 150 in PM  to 100  BID  Risperdal ( which was given routine and PRN at home) stopped-  Oxcarbazine stopped and Tegretol 200 mg BID added  Cogentin decreased from 1mg  daily to 0.5 mg daily Saphris 5mg  Q12 PRN for agitation added - following QTc -  if hypothermia resolves, can transfer out of SDU  Active Problems:  U retention/ AKI - cont Flomax- foley taken out but continue to retain and foley replaced- will remove foley again today and follow - per psych, his psych meds may be causing this- medications changes being made by psych- see above - Cr 2/24 rose despite foley- may not be drinking well- improved with slow IVF  Swelling of hand - xray does not show fracture --initially suspected to be cellulitis-  given Clinda last in ER and then zosyn & Vanc- Vanc stopped 2/21- hand swelling not improving therefore I doubt it is cellulitis and is likely a traumatic injury-  (based on H and P) -  hand is non tender to touch - stopped Zosyn 2/23 - swelling has resolved    Hypothermia - no source of sepsis found for this- no endocrine cause found- cortisol and thyroid functions are normal - blood cultures negative, UA negative - suspect poor thermoregulation due to antipsychotics- have spoken with psych about this-  following temps closely  Elevated LFTs -  CT abd shows no signs of infection- he has a mild CBD dilatation- doubt this is significant - suspect LFTs are elevated due to psych medications/ hypothermia  - improving  Hypoglycemia - sugar 22 on 2/23- following - may not be accurate as he has had  no other drops < 50- will stop checking  Thrombocytopenia - noted on 2/16 and has progressed- medication related?? Due to Zosyn? - stopped Zosyn on 2/23- - following daily- improving    Pressure injury of skin Pressure Injury 05/01/16 Stage II - left buttock- Partial thickness loss of dermis presenting as a shallow open ulcer with a red, pink wound bed without slough. irregular in size - white granulation tissue to area.     DVT prophylaxis: SCDs Code Status: Full code Family Communication:  Disposition Plan: follow in SDU due to hypothermia Consultants:   psych Procedures:    Antimicrobials:  Anti-infectives    Start      Dose/Rate Route Frequency Ordered Stop   05/02/16 2000  vancomycin (VANCOCIN) IVPB 750 mg/150 ml premix  Status:  Discontinued     750 mg 150 mL/hr over 60 Minutes Intravenous Every 12 hours 05/02/16 0643 05/03/16 0805   05/02/16 1400  piperacillin-tazobactam (ZOSYN) IVPB 3.375 g  Status:  Discontinued     3.375 g 12.5 mL/hr over 240 Minutes Intravenous Every 8 hours 05/02/16 0532 05/04/16 1125   05/02/16 0415  piperacillin-tazobactam (ZOSYN) IVPB 3.375 g     3.375 g 100 mL/hr over 30 Minutes Intravenous  Once 05/02/16 0407 05/02/16 0553   05/02/16 0415  vancomycin (VANCOCIN) IVPB 1000 mg/200 mL premix     1,000 mg 200 mL/hr over 60 Minutes Intravenous  Once 05/02/16 0407 05/02/16 0745   05/01/16 2200  clindamycin (CLEOCIN) IVPB 600 mg     600 mg 100 mL/hr over 30 Minutes Intravenous  Once 05/01/16 2148 05/02/16 0010       Objective: Vitals:   05/07/16 0500 05/07/16 0700 05/07/16 0800 05/07/16 0900  BP:   (!) 168/79   Pulse: 71 76 85   Resp:   15   Temp: 97.5 F (36.4 C)  97.7 F (36.5 C)   TempSrc: Axillary  Axillary   SpO2: 100% 100% 96% 99%  Weight:      Height:        Intake/Output Summary (Last 24 hours) at 05/07/16 1010 Last data filed at 05/07/16 0900  Gross per 24 hour  Intake              510 ml  Output              535 ml  Net              -25 ml   Filed Weights   05/02/16 0640  Weight: 71 kg (156 lb 8.4 oz)    Examination: General exam: alert, nonverbal, does not follow commands HEENT: PERRLA, oral mucosa moist, no sclera icterus or thrush Respiratory system: Clear to auscultation. Respiratory effort normal. Cardiovascular system: S1 & S2 heard, RRR.  No murmurs  Gastrointestinal system: Abdomen soft, non-tender, nondistended. Normal bowel sound. No organomegaly Central nervous system: moves all extremities Extremities: No cyanosis, clubbing- swelling of left hand improving but not resolved Skin: No rashes or ulcers Psychiatry:  Intermittently  agitated requiring sedation    Data Reviewed: I have personally reviewed following labs and imaging studies  CBC:  Recent Labs Lab 05/01/16 1930 05/02/16 0819 05/03/16 0848 05/04/16 0331 05/05/16 0358 05/06/16 0314 05/07/16 0357  WBC 10.8* 12.8* 9.5 7.0 6.9 7.6 7.6  NEUTROABS 9.9* 11.4*  --   --   --   --   --   HGB 10.5* 10.8* 9.6* 9.5* 9.7* 9.6* 10.4*  HCT 30.1* 31.2* 27.9* 27.4* 28.2* 27.4* 30.1*  MCV 93.8 92.0 91.5 93.2 93.4 92.9 92.3  PLT 78* 79* 61* 77* 82* 103* 137*   Basic Metabolic Panel:  Recent Labs Lab 05/02/16 0819  05/03/16 0848 05/03/16 1353 05/05/16 0358 05/06/16 0314 05/07/16 0357  NA 143  144  < > 146* 144 148* 147* 144  K 4.2  4.3  < > 3.8 3.8 3.9 3.7 4.0  CL 110  109  < > 112* 113* 112* 112* 111  CO2 28  30  < > 29 26 30 30 28   GLUCOSE 66  72  < > 80 71 70 82 73  BUN 44*  44*  < > 38* 35* 28* 23* 21*  CREATININE 1.23  1.29*  < > 1.30* 1.24 1.33* 1.18 1.17  CALCIUM 9.6  9.5  < > 9.5 9.2 9.4 9.4 9.3  MG 2.4  --   --   --   --   --   --   < > = values in this interval not displayed. GFR: Estimated Creatinine Clearance: 64.1 mL/min (by C-G formula based on SCr of 1.17 mg/dL). Liver Function Tests:  Recent Labs Lab 05/02/16 0819 05/02/16 1854 05/03/16 1353 05/05/16 0358  AST 136* 133* 107* 79*  ALT 84* 77* 69* 59  ALKPHOS 92 88 80 88  BILITOT 0.6 0.9 1.5* 0.8  PROT 5.9* 5.6* 5.5* 6.2*  ALBUMIN 3.1* 2.9* 2.8* 2.9*   No results for input(s): LIPASE, AMYLASE in the last 168 hours.  Recent Labs Lab 05/01/16 1930  AMMONIA 24   Coagulation Profile:  Recent Labs Lab 05/02/16 0819  INR 1.01   Cardiac Enzymes: No results for input(s): CKTOTAL, CKMB, CKMBINDEX, TROPONINI in the last 168 hours. BNP (last 3 results) No results for input(s): PROBNP in the last 8760 hours. HbA1C: No results for input(s): HGBA1C in the last 72 hours. CBG:  Recent Labs Lab 05/06/16 0657 05/06/16 0745 05/06/16 1224 05/06/16 1629  05/07/16 0747  GLUCAP 64* 101* 101* 96 85   Lipid Profile: No results for input(s): CHOL, HDL, LDLCALC, TRIG, CHOLHDL, LDLDIRECT in the last 72 hours. Thyroid Function Tests: No results for input(s): TSH, T4TOTAL, FREET4, T3FREE, THYROIDAB in the last 72 hours. Anemia Panel: No results for input(s): VITAMINB12, FOLATE, FERRITIN, TIBC, IRON, RETICCTPCT in the last 72 hours. Urine analysis:    Component Value Date/Time   COLORURINE YELLOW 05/01/2016 0025   APPEARANCEUR CLOUDY (A) 05/01/2016 0025   LABSPEC 1.015 05/01/2016 0025   PHURINE 7.0 05/01/2016 0025   GLUCOSEU NEGATIVE 05/01/2016 0025   HGBUR NEGATIVE 05/01/2016 0025   BILIRUBINUR NEGATIVE 05/01/2016 0025   KETONESUR NEGATIVE 05/01/2016 0025   PROTEINUR NEGATIVE 05/01/2016 0025   UROBILINOGEN 1.0 01/16/2012 1957   NITRITE NEGATIVE 05/01/2016 0025   LEUKOCYTESUR NEGATIVE 05/01/2016 0025   Sepsis Labs: @LABRCNTIP (procalcitonin:4,lacticidven:4) ) Recent Results (from the past 240 hour(s))  MRSA PCR Screening     Status: None   Collection Time: 05/02/16  3:08 AM  Result Value Ref Range Status   MRSA by PCR NEGATIVE NEGATIVE Final    Comment:        The GeneXpert MRSA Assay (FDA approved for NASAL specimens only), is one component of a comprehensive MRSA colonization surveillance program. It is not intended to diagnose MRSA infection nor to guide or monitor treatment for MRSA infections.   Culture, blood (x 2)     Status: None (Preliminary result)   Collection Time: 05/02/16  8:19 AM  Result Value Ref Range Status   Specimen Description BLOOD  LEFT ARM  Final   Special Requests IN PEDIATRIC BOTTLE 1CC  Final   Culture   Final    NO GROWTH 4 DAYS Performed at [facility], [address], [place], [place] [address]    Report Status PENDING  Incomplete  Culture, blood (x 2)     Status: None (Preliminary result)   Collection Time: 05/02/16  8:19 AM  Result Value Ref Range Status   Specimen Description BLOOD  LEFT ARM  Final   Special Requests IN PEDIATRIC BOTTLE 2CC  Final   Culture   Final    NO GROWTH 4 DAYS Performed at [facility], [address] [place], [place] [address]    Report Status PENDING  Incomplete         Radiology Studies: No results found.    Scheduled Meds: . benztropine  0.5 mg Oral Daily  . carbamazepine  200 mg Oral BID PC  . feeding supplement (ENSURE ENLIVE)  237 mL Oral Q24H  . haloperidol  5 mg Oral BID  . lamoTRIgine  100 mg Oral BID  . LORazepam  0.5 mg Oral BID  . pantoprazole  40 mg Oral Daily  . polycarbophil  625 mg Oral q morning - 10a  . potassium chloride  10 mEq Oral q morning - 10a  . tamsulosin  0.8 mg Oral Daily   Continuous Infusions:    LOS: 5 days    Time spent in minutes: 35    Senita Corredor, MD [facility] Pager: [email or web address] Password [email or web address] 05/07/2016, 10:10 AM

## 2016-05-07 NOTE — Consult Note (Signed)
[facility] Psych ED Progress Note  05/07/2016 11:15 AM Antonio Montoya  MRN:  [id number]   Subjective/Objective: Patient with history of intellectual disability who was admitted to medical floor due to hypothermia and delirium. Patient is sleeping and unable to provide history. However, some background information was obtained from his sisters who came to visit him in the hospital. They reports that patient had an head injury when he was a child and as a result had a behavior change. They reports that he was not able to live with the family since then, was always placed with other families who takes care of him. Sister also report that patient was evaluated for psychiatric problem at [facility] dis many years ago and was found to be suffering from intellectual disability but no mental illness. Also, nursing staff reports that patient is combative, confused  and gets easily agitated but he is unable to give any history himself.  Interval history: Patient seen, case discussed with the staff RN who is at bedside and also psychiatric social service was following the rounds. Patient seems to be somewhat better today, able to lie down on his bed while awake with his head end was elevated and able to eat and drink with the assistance of staff RN. Patient foley catheter has been removed, and used a condom catheter which patient pulled out and is being waiting for scanning his bladder for urinary retention at this time. Patient has no agitation or aggressive behaviors and his distance was removed and he has no sitter at this time. Appreciate Dr.Akintayo recommendation during this weekend. She has no family members at bedside during my evaluation.  Principal Problem: Acute delirium Diagnosis:   Patient Active Problem List   Diagnosis Date Noted  . Urinary retention [R33.9] 05/04/2016  . Thrombocytopenia ([place]) [D69.6] 05/04/2016  . Pressure injury of skin [L89.90] 05/02/2016  . Hypothermia [T68.XXXA] 05/02/2016  .  Elevated LFTs [R79.89]   . Acute delirium [R41.0] 05/01/2016   Total Time spent with patient: 30 minutes  Past Psychiatric History: as above  Past Medical History:  Past Medical History:  Diagnosis Date  . MR (mental retardation)   . Schizophrenia ([place])    History reviewed. No pertinent surgical history. Family History:  Family History  Problem Relation Age of Onset  . Family history unknown: Yes   Family Psychiatric  History:  Social History:  History  Alcohol Use No     History  Drug Use No    Social History   Social History  . Marital status: Single    Spouse name: N/A  . Number of children: N/A  . Years of education: N/A   Social History Main Topics  . Smoking status: Never Smoker  . Smokeless tobacco: Never Used  . Alcohol use No  . Drug use: No  . Sexual activity: No   Other Topics Concern  . None   Social History Narrative  . None    Sleep: Good  Appetite:  Fair  Current Medications: Current Facility-Administered Medications  Medication Dose Route Frequency Provider Last Rate Last Dose  . acetaminophen (TYLENOL) tablet 650 mg  650 mg Oral Q6H PRN Rise Patience, MD       Or  . acetaminophen (TYLENOL) suppository 650 mg  650 mg Rectal Q6H PRN Rise Patience, MD      . asenapine (SAPHRIS) sublingual tablet 5 mg  5 mg Sublingual Q12H PRN Corena Pilgrim, MD   5 mg at 05/06/16 1651  . benztropine (  COGENTIN) tablet 0.5 mg  0.5 mg Oral Daily Mojeed Akintayo, MD   0.5 mg at 05/07/16 0919  . carbamazepine (TEGRETOL) tablet 200 mg  200 mg Oral BID PC Mojeed Akintayo, MD   200 mg at 05/07/16 0919  . feeding supplement (ENSURE ENLIVE) (ENSURE ENLIVE) liquid 237 mL  237 mL Oral Q24H Debbe Odea, MD   237 mL at 05/06/16 2002  . haloperidol (HALDOL) tablet 5 mg  5 mg Oral BID Corena Pilgrim, MD   5 mg at 05/07/16 0919  . lamoTRIgine (LAMICTAL) tablet 100 mg  100 mg Oral BID Rise Patience, MD   100 mg at 05/07/16 0919  . LORazepam (ATIVAN)  injection 1 mg  1 mg Intravenous Q6H PRN Debbe Odea, MD   1 mg at 05/06/16 0834  . LORazepam (ATIVAN) tablet 0.5 mg  0.5 mg Oral BID Corena Pilgrim, MD   0.5 mg at 05/07/16 0919  . ondansetron (ZOFRAN) tablet 4 mg  4 mg Oral Q6H PRN Rise Patience, MD       Or  . ondansetron [facility]) injection 4 mg  4 mg Intravenous Q6H PRN Rise Patience, MD      . pantoprazole (PROTONIX) EC tablet 40 mg  40 mg Oral Daily Rise Patience, MD   40 mg at 05/07/16 0919  . polycarbophil (FIBERCON) tablet 625 mg  625 mg Oral q morning - 10a Rise Patience, MD   625 mg at 05/07/16 0919  . potassium chloride (K-DUR,KLOR-CON) CR tablet 10 mEq  10 mEq Oral q morning - 10a Rise Patience, MD   10 mEq at 05/07/16 0919  . tamsulosin (FLOMAX) capsule 0.8 mg  0.8 mg Oral Daily Rise Patience, MD   0.8 mg at 05/07/16 7829    Lab Results:  Results for orders placed or performed during the hospital encounter of 05/01/16 (from the past 48 hour(s))  Glucose, capillary     Status: None   Collection Time: 05/05/16  1:44 PM  Result Value Ref Range   Glucose-Capillary 69 65 - 99 mg/dL  Glucose, capillary     Status: None   Collection Time: 05/05/16  2:56 PM  Result Value Ref Range   Glucose-Capillary 91 65 - 99 mg/dL  Glucose, capillary     Status: Abnormal   Collection Time: 05/05/16  5:17 PM  Result Value Ref Range   Glucose-Capillary 101 (H) 65 - 99 mg/dL  Glucose, capillary     Status: Abnormal   Collection Time: 05/05/16  9:56 PM  Result Value Ref Range   Glucose-Capillary 105 (H) 65 - 99 mg/dL   Comment 1 Notify RN    Comment 2 Document in Chart   Basic metabolic panel     Status: Abnormal   Collection Time: 05/06/16  3:14 AM  Result Value Ref Range   Sodium 147 (H) 135 - 145 mmol/L   Potassium 3.7 3.5 - 5.1 mmol/L   Chloride 112 (H) 101 - 111 mmol/L   CO2 30 22 - 32 mmol/L   Glucose, Bld 82 65 - 99 mg/dL   BUN 23 (H) 6 - 20 mg/dL   Creatinine, Ser 1.18 0.61 - 1.24 mg/dL    Calcium 9.4 8.9 - 10.3 mg/dL   GFR calc non Af Amer >60 >60 mL/min   GFR calc Af Amer >60 >60 mL/min    Comment: (NOTE) The eGFR has been calculated using the CKD EPI equation. This calculation has not been validated in all  clinical situations. eGFR's persistently <60 mL/min signify possible Chronic Kidney Disease.    Anion gap 5 5 - 15  CBC     Status: Abnormal   Collection Time: 05/06/16  3:14 AM  Result Value Ref Range   WBC 7.6 4.0 - 10.5 K/uL   RBC 2.95 (L) 4.22 - 5.81 MIL/uL   Hemoglobin 9.6 (L) 13.0 - 17.0 g/dL   HCT 27.4 (L) 39.0 - 52.0 %   MCV 92.9 78.0 - 100.0 fL   MCH 32.5 26.0 - 34.0 pg   MCHC 35.0 30.0 - 36.0 g/dL   RDW 13.4 11.5 - 15.5 %   Platelets 103 (L) 150 - 400 K/uL    Comment: CONSISTENT WITH PREVIOUS RESULT  Glucose, capillary     Status: Abnormal   Collection Time: 05/06/16  6:38 AM  Result Value Ref Range   Glucose-Capillary 63 (L) 65 - 99 mg/dL   Comment 1 Notify RN   Glucose, capillary     Status: Abnormal   Collection Time: 05/06/16  6:57 AM  Result Value Ref Range   Glucose-Capillary 64 (L) 65 - 99 mg/dL   Comment 1 Notify RN   Glucose, capillary     Status: Abnormal   Collection Time: 05/06/16  7:45 AM  Result Value Ref Range   Glucose-Capillary 101 (H) 65 - 99 mg/dL   Comment 1 Notify RN    Comment 2 Document in Chart   Glucose, capillary     Status: Abnormal   Collection Time: 05/06/16 12:24 PM  Result Value Ref Range   Glucose-Capillary 101 (H) 65 - 99 mg/dL   Comment 1 Notify RN    Comment 2 Document in Chart   Glucose, capillary     Status: None   Collection Time: 05/06/16  4:29 PM  Result Value Ref Range   Glucose-Capillary 96 65 - 99 mg/dL  CBC     Status: Abnormal   Collection Time: 05/07/16  3:57 AM  Result Value Ref Range   WBC 7.6 4.0 - 10.5 K/uL   RBC 3.26 (L) 4.22 - 5.81 MIL/uL   Hemoglobin 10.4 (L) 13.0 - 17.0 g/dL   HCT 30.1 (L) 39.0 - 52.0 %   MCV 92.3 78.0 - 100.0 fL   MCH 31.9 26.0 - 34.0 pg   MCHC 34.6 30.0 -  36.0 g/dL   RDW 13.2 11.5 - 15.5 %   Platelets 137 (L) 150 - 400 K/uL  Basic metabolic panel     Status: Abnormal   Collection Time: 05/07/16  3:57 AM  Result Value Ref Range   Sodium 144 135 - 145 mmol/L   Potassium 4.0 3.5 - 5.1 mmol/L   Chloride 111 101 - 111 mmol/L   CO2 28 22 - 32 mmol/L   Glucose, Bld 73 65 - 99 mg/dL   BUN 21 (H) 6 - 20 mg/dL   Creatinine, Ser 1.17 0.61 - 1.24 mg/dL   Calcium 9.3 8.9 - 10.3 mg/dL   GFR calc non Af Amer >60 >60 mL/min   GFR calc Af Amer >60 >60 mL/min    Comment: (NOTE) The eGFR has been calculated using the CKD EPI equation. This calculation has not been validated in all clinical situations. eGFR's persistently <60 mL/min signify possible Chronic Kidney Disease.    Anion gap 5 5 - 15  Glucose, capillary     Status: None   Collection Time: 05/07/16  7:47 AM  Result Value Ref Range   Glucose-Capillary 85 65 -  99 mg/dL   Comment 1 Notify RN    Comment 2 Document in Chart     Blood Alcohol level:  Lab Results  Component Value Date   ETH <5 04/01/2016    Physical Findings: AIMS:  , ,  ,  ,    CIWA:    COWS:     Musculoskeletal: Strength & Muscle Tone: not tested Gait & Station: unable to stand Patient leans: N/A  Psychiatric Specialty Exam: Physical Exam  Psychiatric: Thought content normal. His affect is blunt. His speech is delayed. He is agitated, slowed, withdrawn and combative. Cognition and memory are impaired. He expresses impulsivity.    Review of Systems  Unable to perform ROS: Other    Blood pressure (!) 168/79, pulse 70, temperature 98 F (36.7 C), temperature source Axillary, resp. rate (!) 21, height 6' (1.829 m), weight 71 kg (156 lb 8.4 oz), SpO2 94 %.Body mass index is 21.23 kg/m.  General Appearance: Casual  Eye Contact:  Fair  Speech:  Slow, waved his right hand as a part of communication  Volume:  Decreased  Mood:  Irritable  Affect:  Full Range  Thought Process:  Disorganized  Orientation:  Other:   disoriented  Thought Content:  Illogical  Suicidal Thoughts:  unable to assess  Homicidal Thoughts:  unable to assess  Memory:  unable to assess  Judgement:  Impaired  Insight:  Lacking  Psychomotor Activity:  Restlessness  Concentration:  Concentration: Poor and Attention Span: Poor  Recall:  Poor  Fund of Knowledge:  Poor  Language:  Fair  Akathisia:  No  Handed:  Right  AIMS (if indicated):     Assets:  Housing Social Support  ADL's:  Impaired  Cognition:  Impaired,  Moderate  Sleep:         Treatment Plan Summary: 66 years old male with the intellectual disability, chronic out-of-home placement presented with urinary retention and increased agitation. Patient also has mild hypokalemia which has been improved since yesterday.  Daily contact with patient to assess and evaluate symptoms and progress in treatment and Medication management  Continue Tegretol 200 mg bid for agitation. Continue Haldol to 5 mg bid due excessive sedation. Continue Lorazepam to 0.5 mg bid for agitation. Continue Saphris 5 mg S/L bid as needed for agitation Patient does not meet criteria for inpatient admission Patient needed to be monitored for the EKG for possible QTC prolongation Appreciate psychiatric consultation and follow up as clinically required Please contact [phone] or [phone] if needs further assistance  Psychiatric social service will contact patient daughter regarding disposition plans for possibly going back to the group home if he shows clinical improvement, if not he may need a skilled nursing home placement.  Ambrose Finland, MD 05/07/2016, 11:15 AM

## 2016-05-08 LAB — COMPREHENSIVE METABOLIC PANEL
ALBUMIN: 3.4 g/dL — AB (ref 3.5–5.0)
ALT: 43 U/L (ref 17–63)
ANION GAP: 5 (ref 5–15)
AST: 45 U/L — AB (ref 15–41)
Alkaline Phosphatase: 113 U/L (ref 38–126)
BILIRUBIN TOTAL: 0.5 mg/dL (ref 0.3–1.2)
BUN: 20 mg/dL (ref 6–20)
CHLORIDE: 107 mmol/L (ref 101–111)
CO2: 32 mmol/L (ref 22–32)
Calcium: 9.7 mg/dL (ref 8.9–10.3)
Creatinine, Ser: 1.12 mg/dL (ref 0.61–1.24)
GFR calc Af Amer: >60 mL/min (ref >60)
GFR calc non Af Amer: >60 mL/min (ref >60)
GLUCOSE: 71 mg/dL (ref 65–99)
POTASSIUM: 3.6 mmol/L (ref 3.5–5.1)
SODIUM: 144 mmol/L (ref 135–145)
TOTAL PROTEIN: 6.6 g/dL (ref 6.5–8.1)

## 2016-05-08 NOTE — Progress Notes (Addendum)
PROGRESS NOTE    Antonio PiggsCharlie Boggan   ZOX:[id number]  DOB: [birth year]  DOA: 05/01/2016 PCP: Shayne AlkenEGE,HILMI, MD (Inactive)   Medically stable to be discharged to a psych facility   Brief Narrative:  Antonio Montoya is a 66 y.o. male with DM diet controlled, mental retardation and schizophrenia had come to the ER 2/19 for urinary retention (bladder scan showed > 1 L urine and caretaker stated he had not urinated in 18 hrs). He had Foley catheter placed and sent back to group home.  Patient is unable to give history- per ER HPI, he came if in for hematuria after pulling on his foley cath. He has beem increasingly agitated for a few wks per caregiver. Caregiver stated pt recently had a fit and broke several items including a glass ~ 3 days prior to admission & had swelling to the left hand on the day of admission .  In the ER patient had to be sedated with Ativan. He was found to have hypothermia with temp of 95.7.  This is the 5th visit to the ER since Jan of this year.  1/21- stumbled, fell, abrasion to forehead 2/4- left subconjunctival hemorrhage 2/16- off balance- head CT ok  2/16- U retention  Subjective: Non-verbal  Assessment & Plan:   Principal Problem:   Acute delirium, hypothermia, Urinary retention - due to psych issues and psych medications- psychiatry managing this  - his group home does not want him back -as the foley may be the cause of his increased agitation,  I asked RN to pull foley  - agitation did not improve- foley did have to be re-inserted due to retention and psych consulted for assistance - the following changed made thus far:  decreased Haldol from 25 to 15 QHS and now Haldol 5 mg BID Added 1mg  Ativan QHS  now changed to 0.5 mg BID with 1mg  Q 6 PRN Lamictal decreased from 125 in AM and 150 in PM  to 100  BID  Risperdal ( which was given routine and PRN at home) stopped-  Oxcarbazine stopped Tegretol 200 mg BID added  Cogentin decreased from 1mg  daily to 0.5 mg  daily Saphris 5mg  Q12 PRN for agitation added - following QTc  - hypothermia resolved,  transferred out of SDU but still intermittently agitated   Active Problems:  U retention/ AKI - initially presented to ER on 2/19- foley placed- started 0.8 mg of Flomax and sent back to Group home - voiding trial 2/21- we were hoping his agitation would resolve with removing foley as well but he failed trial and agitation did not improve - Second voiding trial given on 1/26- failed this one as well - per psych, his psych meds may be causing this- medications changes being made by psych- see above - Cr 2/24 rose despite foley- may not be drinking well- improved with slow IVF  Swelling of left hand - xray does not show fracture --initially suspected to be cellulitis in setting of hypothermia-  given Clinda  in ER and then zosyn & Vanc - Vanc stopped 2/21 and Zosyn continued- hand swelling not improving therefore I doubt it is cellulitis and is likely a traumatic injury-  (based on H and P) -  hand is non tender to touch - stopped Zosyn 2/23 - swelling has resolved after stopping Zosyn    Hypothermia - no source of sepsis found for this- blood cultures negative, UA negative - no endocrine cause found- cortisol and thyroid functions are normal - suspect  poor thermoregulation due to antipsychotics- have spoken with psych about this-  Temps have improved  Elevated LFTs -  CT abd shows no signs of infection- he has a mild CBD dilatation- doubt this is significant - suspect LFTs are elevated due to psych medications/ hypothermia   Hypoglycemia - sugar 22 on 2/23- which was likely not accurate as he has had no other drops < 50- will stop checking  Bradycardia - present since admission - HR 50-60 when awake- drops to 30-40s when he sleeps- BP has been stable  Thrombocytopenia - noted on 2/16 and has progressed- medication related from Zosyn? - stopped Zosyn on 2/23- - following daily- improving     Pressure injury of skin Pressure Injury 05/01/16 Stage II - left buttock- Partial thickness loss of dermis presenting as a shallow open ulcer with a red, pink wound bed without slough. irregular in size - white granulation tissue to area.     DVT prophylaxis: SCDs Code Status: Full code Family Communication:  Disposition Plan: plan per psych Consultants:   psych Procedures:    Antimicrobials:  Anti-infectives    Start     Dose/Rate Route Frequency Ordered Stop   05/02/16 2000  vancomycin (VANCOCIN) IVPB 750 mg/150 ml premix  Status:  Discontinued     750 mg 150 mL/hr over 60 Minutes Intravenous Every 12 hours 05/02/16 0643 05/03/16 0805   05/02/16 1400  piperacillin-tazobactam (ZOSYN) IVPB 3.375 g  Status:  Discontinued     3.375 g 12.5 mL/hr over 240 Minutes Intravenous Every 8 hours 05/02/16 0532 05/04/16 1125   05/02/16 0415  piperacillin-tazobactam (ZOSYN) IVPB 3.375 g     3.375 g 100 mL/hr over 30 Minutes Intravenous  Once 05/02/16 0407 05/02/16 0553   05/02/16 0415  vancomycin (VANCOCIN) IVPB 1000 mg/200 mL premix     1,000 mg 200 mL/hr over 60 Minutes Intravenous  Once 05/02/16 0407 05/02/16 0745   05/01/16 2200  clindamycin (CLEOCIN) IVPB 600 mg     600 mg 100 mL/hr over 30 Minutes Intravenous  Once 05/01/16 2148 05/02/16 0010       Objective: Vitals:   05/07/16 1300 05/07/16 1523 05/07/16 2102 05/08/16 1232  BP:  109/88 (!) 181/104 (!) 144/74  Pulse: 74 75 60 (!) 47  Resp: (!) 21 (!) 22    Temp:  99 F (37.2 C)  97.9 F (36.6 C)  TempSrc:  Axillary  Oral  SpO2: 97%     Weight:      Height:        Intake/Output Summary (Last 24 hours) at 05/08/16 1543 Last data filed at 05/08/16 1300  Gross per 24 hour  Intake              240 ml  Output             2175 ml  Net            -1935 ml   Filed Weights   05/02/16 0640  Weight: 71 kg (156 lb 8.4 oz)    Examination: General exam: alert, nonverbal, does not follow commands HEENT: PERRLA, oral mucosa  moist, no sclera icterus or thrush Respiratory system: Clear to auscultation. Respiratory effort normal. Cardiovascular system: S1 & S2 heard, RRR.  No murmurs  Gastrointestinal system: Abdomen soft, non-tender, nondistended. Normal bowel sound. No organomegaly Central nervous system: moves all extremities Extremities: No cyanosis, clubbing- swelling of left hand resolved Skin: No rashes or ulcers Psychiatry:  Intermittently agitated requiring sedation  Data Reviewed: I have personally reviewed following labs and imaging studies  CBC:  Recent Labs Lab 05/01/16 1930 05/02/16 0819 05/03/16 0848 05/04/16 0331 05/05/16 0358 05/06/16 0314 05/07/16 0357  WBC 10.8* 12.8* 9.5 7.0 6.9 7.6 7.6  NEUTROABS 9.9* 11.4*  --   --   --   --   --   HGB 10.5* 10.8* 9.6* 9.5* 9.7* 9.6* 10.4*  HCT 30.1* 31.2* 27.9* 27.4* 28.2* 27.4* 30.1*  MCV 93.8 92.0 91.5 93.2 93.4 92.9 92.3  PLT 78* 79* 61* 77* 82* 103* 137*   Basic Metabolic Panel:  Recent Labs Lab 05/02/16 0819  05/03/16 0848 05/03/16 1353 05/05/16 0358 05/06/16 0314 05/07/16 0357  NA 143  144  < > 146* 144 148* 147* 144  K 4.2  4.3  < > 3.8 3.8 3.9 3.7 4.0  CL 110  109  < > 112* 113* 112* 112* 111  CO2 28  30  < > 29 26 30 30 28   GLUCOSE 66  72  < > 80 71 70 82 73  BUN 44*  44*  < > 38* 35* 28* 23* 21*  CREATININE 1.23  1.29*  < > 1.30* 1.24 1.33* 1.18 1.17  CALCIUM 9.6  9.5  < > 9.5 9.2 9.4 9.4 9.3  MG 2.4  --   --   --   --   --   --   < > = values in this interval not displayed. GFR: Estimated Creatinine Clearance: 64.1 mL/min (by C-G formula based on SCr of 1.17 mg/dL). Liver Function Tests:  Recent Labs Lab 05/02/16 0819 05/02/16 1854 05/03/16 1353 05/05/16 0358  AST 136* 133* 107* 79*  ALT 84* 77* 69* 59  ALKPHOS 92 88 80 88  BILITOT 0.6 0.9 1.5* 0.8  PROT 5.9* 5.6* 5.5* 6.2*  ALBUMIN 3.1* 2.9* 2.8* 2.9*   No results for input(s): LIPASE, AMYLASE in the last 168 hours.  Recent Labs Lab  05/01/16 1930  AMMONIA 24   Coagulation Profile:  Recent Labs Lab 05/02/16 0819  INR 1.01   Cardiac Enzymes: No results for input(s): CKTOTAL, CKMB, CKMBINDEX, TROPONINI in the last 168 hours. BNP (last 3 results) No results for input(s): PROBNP in the last 8760 hours. HbA1C: No results for input(s): HGBA1C in the last 72 hours. CBG:  Recent Labs Lab 05/06/16 0657 05/06/16 0745 05/06/16 1224 05/06/16 1629 05/07/16 0747  GLUCAP 64* 101* 101* 96 85   Lipid Profile: No results for input(s): CHOL, HDL, LDLCALC, TRIG, CHOLHDL, LDLDIRECT in the last 72 hours. Thyroid Function Tests: No results for input(s): TSH, T4TOTAL, FREET4, T3FREE, THYROIDAB in the last 72 hours. Anemia Panel: No results for input(s): VITAMINB12, FOLATE, FERRITIN, TIBC, IRON, RETICCTPCT in the last 72 hours. Urine analysis:    Component Value Date/Time   COLORURINE YELLOW 05/01/2016 0025   APPEARANCEUR CLOUDY (A) 05/01/2016 0025   LABSPEC 1.015 05/01/2016 0025   PHURINE 7.0 05/01/2016 0025   GLUCOSEU NEGATIVE 05/01/2016 0025   HGBUR NEGATIVE 05/01/2016 0025   BILIRUBINUR NEGATIVE 05/01/2016 0025   KETONESUR NEGATIVE 05/01/2016 0025   PROTEINUR NEGATIVE 05/01/2016 0025   UROBILINOGEN 1.0 01/16/2012 1957   NITRITE NEGATIVE 05/01/2016 0025   LEUKOCYTESUR NEGATIVE 05/01/2016 0025   Sepsis Labs: @LABRCNTIP (procalcitonin:4,lacticidven:4) ) Recent Results (from the past 240 hour(s))  MRSA PCR Screening     Status: None   Collection Time: 05/02/16  3:08 AM  Result Value Ref Range Status   MRSA by PCR NEGATIVE NEGATIVE Final  Comment:        The GeneXpert MRSA Assay (FDA approved for NASAL specimens only), is one component of a comprehensive MRSA colonization surveillance program. It is not intended to diagnose MRSA infection nor to guide or monitor treatment for MRSA infections.   Culture, blood (x 2)     Status: None   Collection Time: 05/02/16  8:19 AM  Result Value Ref Range Status    Specimen Description BLOOD LEFT ARM  Final   Special Requests IN PEDIATRIC BOTTLE 1CC  Final   Culture   Final    NO GROWTH 5 DAYS Performed at [facility], [address], [place], [place] [address]    Report Status 05/07/2016 FINAL  Final  Culture, blood (x 2)     Status: None   Collection Time: 05/02/16  8:19 AM  Result Value Ref Range Status   Specimen Description BLOOD LEFT ARM  Final   Special Requests IN PEDIATRIC BOTTLE 2CC  Final   Culture   Final    NO GROWTH 5 DAYS Performed at [facility], [address], [place], [place] [address]    Report Status 05/07/2016 FINAL  Final         Radiology Studies: No results found.    Scheduled Meds: . benztropine  0.5 mg Oral Daily  . carbamazepine  200 mg Oral BID PC  . feeding supplement (ENSURE ENLIVE)  237 mL Oral BID BM  . haloperidol  5 mg Oral BID  . lamoTRIgine  100 mg Oral BID  . LORazepam  0.5 mg Oral BID  . pantoprazole  40 mg Oral Daily  . polycarbophil  625 mg Oral q morning - 10a  . potassium chloride  10 mEq Oral q morning - 10a  . tamsulosin  0.8 mg Oral Daily   Continuous Infusions:    LOS: 6 days    Time spent in minutes: 35    Breean Nannini, MD [facility] Pager: [email or web address] Password [facility] 05/08/2016, 3:43 PM

## 2016-05-08 NOTE — Care Management Important Message (Signed)
Important Message  Patient Details  Name: Antonio Montoya MRN: [id number] Date of Birth: 1950-08-23   Medicare Important Message Given:  Yes    Caren MacadamFuller, Jance Siek 05/08/2016, 10:30 AMImportant Message  Patient Details  Name: Antonio PiggsCharlie Montoya MRN: [id number] Date of Birth: 1950-08-23   Medicare Important Message Given:  Yes    Caren MacadamFuller, Terius Jacuinde 05/08/2016, 10:30 AM

## 2016-05-08 NOTE — Progress Notes (Signed)
Nutrition Follow-up  DOCUMENTATION CODES:   Not applicable  INTERVENTION:  - Continue Ensure Enlive BID and use for medication administration is feasible.  - Continue to encourage PO intakes of meals and supplements. - Continue to assist with feeding pt for meals.   NUTRITION DIAGNOSIS:   Biting/chewing difficulty related to other (see comment) (no teeth or dentures) as evidenced by other (see comment) (RN report). -ongoing  GOAL:   Patient will meet greater than or equal to 90% of their needs -meeting on average.   MONITOR:   PO intake, Supplement acceptance, Weight trends, Labs, Skin, I & O's  ASSESSMENT:   66 y.o. male with mental retardation and schizophrenia had come to the ER 2 days ago for urinary retention and had Foley catheter placed and sent back to group home. Patient was found to be increasingly agitated. Patient also had hurt his left hand in trying to hit something. Patient was brought back to the ER because of increasing difficulty in managing with agitation. In the ER patient had to be sedated with Ativan.   2/27 No new weight since admission. Pt is disoriented x4 and unable to provide information. Per chart review, he ate 75% of eggs and oatmeal for breakfast and 100% of macaroni and cheese for lunch on 2/25. Today he ate 50% of breakfast which consisted of grits, scrambled eggs, a piece of [place] sausage, blueberry muffin, orange juice, coffee, and milk. For lunch he ate 75% of pork loin with gravy, mashed potatoes with gravy, green beans, a roll, ice cream, iced tea, milk, and ice cream.   Medications reviewed; 40 mg oral Protonix/day, 625 mg Fibercon/day, 10 mEq oral KCl/day.  Labs reviewed.   2/21 - Pt sleeping at time of visit with no family/visitors present.  - Spoke with RN who reports pt unable to having a meaningful conversation.  - She reports he ate scrambled eggs, applesauce, and drank juice for breakfast; he was unable to eat the bacon d/t having no  teeth or dentures. - Lunch tray arrived while RD was in the room performing physical assessment; RD informed RN of this.  - Physical assessment limited to upper body only as pt is on Bair Hugger at this time. -  No muscle or fat wasting noted to upper body.  - No weight hx available from PTA.   IVF: NS @ 100 mL/hr.    Diet Order:  DIET DYS 3 Room service appropriate? Yes; Fluid consistency: Thin  Skin:  Wound (see comment) (Stage 2 R hip pressure injury)  Last BM:  2/25  Height:   Ht Readings from Last 1 Encounters:  05/02/16 6' (1.829 m)    Weight:   Wt Readings from Last 1 Encounters:  05/02/16 156 lb 8.4 oz (71 kg)    Ideal Body Weight:  80.91 kg  BMI:  Body mass index is 21.23 kg/m.  Estimated Nutritional Needs:   Kcal:  1630-1845 (23-26 kcal/kg)  Protein:  70-85 grams (1-1.2 grams/kg)  Fluid:     EDUCATION NEEDS:   No education needs identified at this time    Trenton GammonJessica Dae Antonucci, MS, RD, LDN, CNSC Inpatient Clinical Dietitian Pager # [phone] After hours/weekend pager # [phone]

## 2016-05-09 LAB — CBC
HCT: 32.3 % — ABNORMAL LOW (ref 39.0–52.0)
HEMOGLOBIN: 11.4 g/dL — AB (ref 13.0–17.0)
MCH: 32.4 pg (ref 26.0–34.0)
MCHC: 35.3 g/dL (ref 30.0–36.0)
MCV: 91.8 fL (ref 78.0–100.0)
PLATELETS: 211 10*3/uL (ref 150–400)
RBC: 3.52 MIL/uL — AB (ref 4.22–5.81)
RDW: 13.5 % (ref 11.5–15.5)
WBC: 7.5 10*3/uL (ref 4.0–10.5)

## 2016-05-09 NOTE — [place] FL2 (Signed)
[place] MEDICAID FL2 LEVEL OF CARE SCREENING TOOL     IDENTIFICATION  Patient Name: Antonio Montoya Birthdate: 28-Sep-1950 Sex: male Admission Date (Current Location): 05/01/2016  [place] and [place] Number:   Ignacia Palma [place] ) [id number] Q Facility and Address:  [facility],  [address], [place] [address]      Provider Number: [id number]  Attending Physician Name and Address:  Calvert Cantor, MD  Relative Name and Phone Number:       Current Level of Care: Hospital Recommended Level of Care: Skilled Nursing Facility Prior Approval Number:    Date Approved/Denied:   PASRR Number:    Discharge Plan: SNF    Current Diagnoses: Patient Active Problem List   Diagnosis Date Noted  . Urinary retention 05/04/2016  . Thrombocytopenia (HCC) 05/04/2016  . Pressure injury of skin 05/02/2016  . Hypothermia 05/02/2016  . Elevated LFTs   . Acute delirium 05/01/2016    Orientation RESPIRATION BLADDER Height & Weight      (Disoriented x4)  Normal Indwelling catheter Weight: 156 lb 8.4 oz (71 kg) Height:  6' (182.9 cm)  BEHAVIORAL SYMPTOMS/MOOD NEUROLOGICAL BOWEL NUTRITION STATUS   (Mental Retardation)   Incontinent Diet (Dyshagia 3 )  AMBULATORY STATUS COMMUNICATION OF NEEDS Skin   Limited Assist Verbally PU Stage and Appropriate Care (Partial Thickness loss of dermis presenting as a shallow ulder with a red, pink wound without slough. Irregular in size. White grancalation tissue to area. Draining Puss. )                       Personal Care Assistance Level of Assistance  Bathing, Dressing, Feeding Bathing Assistance: Maximum assistance Feeding assistance: Limited assistance Dressing Assistance: Maximum assistance     Functional Limitations Info  Sight, Hearing, Speech Sight Info: Adequate Hearing Info: Adequate Speech Info: Impaired (Responds to voice)    SPECIAL CARE FACTORS FREQUENCY                       Contractures Contractures  Info: Present    Additional Factors Info  Code Status, Allergies, Psychotropic Code Status Info: Fullcode Allergies Info: No Known Allergies Psychotropic Info: Haldol, Lamictal, Ativan         Current Medications (05/09/2016):  This is the current hospital active medication list Current Facility-Administered Medications  Medication Dose Route Frequency Provider Last Rate Last Dose  . acetaminophen (TYLENOL) tablet 650 mg  650 mg Oral Q6H PRN Eduard Clos, MD       Or  . acetaminophen (TYLENOL) suppository 650 mg  650 mg Rectal Q6H PRN Eduard Clos, MD      . asenapine (SAPHRIS) sublingual tablet 5 mg  5 mg Sublingual Q12H PRN Thedore Mins, MD   5 mg at 05/07/16 1553  . benztropine (COGENTIN) tablet 0.5 mg  0.5 mg Oral Daily Mojeed Akintayo, MD   0.5 mg at 05/08/16 1056  . carbamazepine (TEGRETOL) tablet 200 mg  200 mg Oral BID PC Mojeed Akintayo, MD   200 mg at 05/07/16 0919  . feeding supplement (ENSURE ENLIVE) (ENSURE ENLIVE) liquid 237 mL  237 mL Oral BID BM [name], MD   237 mL at 05/09/16 1400  . haloperidol (HALDOL) tablet 5 mg  5 mg Oral BID Thedore Mins, MD   5 mg at 05/08/16 1057  . lamoTRIgine (LAMICTAL) tablet 100 mg  100 mg Oral BID Eduard Clos, MD   100 mg at 05/08/16 1056  .  LORazepam (ATIVAN) injection 1 mg  1 mg Intravenous Q6H PRN Calvert CantorSaima Rizwan, MD   1 mg at 05/09/16 1605  . LORazepam (ATIVAN) tablet 0.5 mg  0.5 mg Oral BID Thedore MinsMojeed Akintayo, MD   0.5 mg at 05/08/16 1056  . ondansetron (ZOFRAN) tablet 4 mg  4 mg Oral Q6H PRN Eduard ClosArshad N Kakrakandy, MD       Or  . ondansetron [facility](ZOFRAN) injection 4 mg  4 mg Intravenous Q6H PRN Eduard ClosArshad N Kakrakandy, MD      . pantoprazole (PROTONIX) EC tablet 40 mg  40 mg Oral Daily Eduard ClosArshad N Kakrakandy, MD   40 mg at 05/08/16 1056  . polycarbophil (FIBERCON) tablet 625 mg  625 mg Oral q morning - 10a Eduard ClosArshad N Kakrakandy, MD   625 mg at 05/08/16 1055  . potassium chloride (K-DUR,KLOR-CON) CR tablet 10 mEq  10 mEq  Oral q morning - 10a Eduard ClosArshad N Kakrakandy, MD   10 mEq at 05/08/16 1056  . tamsulosin (FLOMAX) capsule 0.8 mg  0.8 mg Oral Daily Eduard ClosArshad N Kakrakandy, MD   0.8 mg at 05/08/16 1056     Discharge Medications: Please see discharge summary for a list of discharge medications.  Relevant Imaging Results:  Relevant Lab Results:   Additional Information ssn:[id number]  Clearance CootsNicole A Maygan Koeller, LCSW

## 2016-05-09 NOTE — Progress Notes (Signed)
PROGRESS NOTE    Antonio Montoya   ZOX:[id number]  DOB: [birth year]  DOA: 05/01/2016 PCP: Shayne Alken, MD (Inactive)    Brief Narrative:  Antonio Montoya is a 66 y.o. male with DM diet controlled, mental retardation and schizophrenia had come to the ER 2/19 for urinary retention (bladder scan showed > 1 L urine and caretaker stated he had not urinated in 18 hrs). He had Foley catheter placed and sent back to group home.  Patient is unable to give history- per ER HPI, he came if in for hematuria after pulling on his foley cath. He has beem increasingly agitated for a few wks per caregiver. Caregiver stated pt recently had a fit and broke several items including a glass ~ 3 days prior to admission & had swelling to the left hand on the day of admission .  In the ER patient had to be sedated with Ativan. He was found to have hypothermia with temp of 95.7.  This is the 5th visit to the ER since Jan of this year.  1/21- stumbled, fell, abrasion to forehead 2/4- left subconjunctival hemorrhage 2/16- off balance- head CT ok  2/16- U retention  Subjective: Non-verbal  Assessment & Plan:   Principal Problem:   Acute delirium, hypothermia, Urinary retention - due to psych issues and psych medications- psychiatry managing this  - his group home does not want him back -as the foley may be the cause of his increased agitation,  I asked RN to pull foley  - agitation did not improve- foley did have to be re-inserted due to retention and psych consulted for assistance - the following changed made thus far by psych:  decreased Haldol from 25 to 15 QHS and now Haldol 5 mg BID Added 1mg  Ativan QHS  now changed to 0.5 mg BID with 1mg  Q 6 PRN Lamictal decreased from 125 in AM and 150 in PM  to 100  BID Risperdal ( which was given routine and PRN at home) stopped-  Oxcarbazine stopped Tegretol 200 mg BID added  Cogentin decreased from 1mg  daily to 0.5 mg daily Saphris 5mg  Q12 PRN for agitation added -  following QTc  - hypothermia resolved,  transferred out of SDU but still intermittently agitated and requiring restraints and a sitter - psych needs to continue to follow and manage this - 2/27 - spoke with Dr Magdalen Spatz with psych who states that the patient has a traumatic brain injury which is causing his behavioral issues and there are no further medication changes he can make for him  Active Problems:  U retention/ AKI - initially presented to ER on 2/19- foley placed- started 0.8 mg of Flomax and sent back to Group home - voiding trial 2/21- we were hoping his agitation would resolve with removing foley as well but he failed trial and agitation did not improve - Second voiding trial given on 1/26- failed this one as well - per Dr Magdalen Spatz, his psych meds may be causing this- medications changes being made by psych  - per Dr Magdalen Spatz today, he has no plans change his psych meds any further because that may cause him to further deteriorate- he states to find other options to treat his U retention - as he has failed a voiding trial twice I cannot remove catheter at this point- will d/c from hospital with catheter and Flomax- we will make an appt with urology for him where he can have further studies done  Swelling of left hand - xray  does not show fracture --initially suspected to be cellulitis in setting of hypothermia-  given Clinda  in ER and then zosyn & Vanc - Vanc stopped 2/21 and Zosyn continued- hand swelling not improving therefore I doubt it is cellulitis and is likely a traumatic injury-  (based on H and P) -  hand is non tender to touch - stopped Zosyn 2/23 - swelling has resolved after stopping Zosyn    Hypothermia - no source of sepsis found for this- blood cultures negative, UA negative - no endocrine cause found- cortisol and thyroid functions are normal - suspect poor thermoregulation due to antipsychotics- have spoken with psych about this-  Temps have  improved  Elevated LFTs -  CT abd shows no signs of infection- he has a mild CBD dilatation- doubt this is significant - suspect LFTs are elevated due to psych medications/ hypothermia   Hypoglycemia - sugar 22 on 2/23- which was likely not accurate as he has had no other drops < 50- will stop checking  Bradycardia - present since admission - HR 50-60 when awake- drops to 30-40s when he sleeps- BP has been stable  Thrombocytopenia - noted on 2/16 and has progressed- medication related from Zosyn? - stopped Zosyn on 2/23- - following daily- improved to normal    Pressure injury of skin Pressure Injury 05/01/16 Stage II - left buttock- Partial thickness loss of dermis presenting as a shallow open ulcer with a red, pink wound bed without slough. irregular in size - white granulation tissue to area.     DVT prophylaxis: SCDs Code Status: Full code Family Communication:  Disposition Plan: plan per psych Consultants:   psych Procedures:    Antimicrobials:  Anti-infectives    Start     Dose/Rate Route Frequency Ordered Stop   05/02/16 2000  vancomycin (VANCOCIN) IVPB 750 mg/150 ml premix  Status:  Discontinued     750 mg 150 mL/hr over 60 Minutes Intravenous Every 12 hours 05/02/16 0643 05/03/16 0805   05/02/16 1400  piperacillin-tazobactam (ZOSYN) IVPB 3.375 g  Status:  Discontinued     3.375 g 12.5 mL/hr over 240 Minutes Intravenous Every 8 hours 05/02/16 0532 05/04/16 1125   05/02/16 0415  piperacillin-tazobactam (ZOSYN) IVPB 3.375 g     3.375 g 100 mL/hr over 30 Minutes Intravenous  Once 05/02/16 0407 05/02/16 0553   05/02/16 0415  vancomycin (VANCOCIN) IVPB 1000 mg/200 mL premix     1,000 mg 200 mL/hr over 60 Minutes Intravenous  Once 05/02/16 0407 05/02/16 0745   05/01/16 2200  clindamycin (CLEOCIN) IVPB 600 mg     600 mg 100 mL/hr over 30 Minutes Intravenous  Once 05/01/16 2148 05/02/16 0010       Objective: Vitals:   05/07/16 2102 05/08/16 1232 05/08/16 2121  05/09/16 0519  BP: (!) 181/104 (!) 144/74 (!) 156/89 (!) 157/75  Pulse: 60 (!) 47 (!) 49 (!) 52  Resp:   14 12  Temp:  97.9 F (36.6 C) 98.5 F (36.9 C) 98.7 F (37.1 C)  TempSrc:  Oral Axillary Axillary  SpO2:      Weight:      Height:        Intake/Output Summary (Last 24 hours) at 05/09/16 1409 Last data filed at 05/09/16 0543  Gross per 24 hour  Intake              180 ml  Output              650 ml  Net             -470 ml   Filed Weights   05/02/16 0640  Weight: 71 kg (156 lb 8.4 oz)    Examination: General exam: alert, nonverbal, does not follow commands HEENT: PERRLA, oral mucosa moist, no sclera icterus or thrush Respiratory system: Clear to auscultation. Respiratory effort normal. Cardiovascular system: S1 & S2 heard, RRR.  No murmurs  Gastrointestinal system: Abdomen soft, non-tender, nondistended. Normal bowel sound. No organomegaly Central nervous system: moves all extremities Extremities: No cyanosis, clubbing- swelling of left hand resolved Skin: No rashes or ulcers Psychiatry:  Intermittently agitated requiring sedation    Data Reviewed: I have personally reviewed following labs and imaging studies  CBC:  Recent Labs Lab 05/04/16 0331 05/05/16 0358 05/06/16 0314 05/07/16 0357 05/09/16 0457  WBC 7.0 6.9 7.6 7.6 7.5  HGB 9.5* 9.7* 9.6* 10.4* 11.4*  HCT 27.4* 28.2* 27.4* 30.1* 32.3*  MCV 93.2 93.4 92.9 92.3 91.8  PLT 77* 82* 103* 137* 211   Basic Metabolic Panel:  Recent Labs Lab 05/03/16 1353 05/05/16 0358 05/06/16 0314 05/07/16 0357 05/08/16 1617  NA 144 148* 147* 144 144  K 3.8 3.9 3.7 4.0 3.6  CL 113* 112* 112* 111 107  CO2 26 30 30 28  32  GLUCOSE 71 70 82 73 71  BUN 35* 28* 23* 21* 20  CREATININE 1.24 1.33* 1.18 1.17 1.12  CALCIUM 9.2 9.4 9.4 9.3 9.7   GFR: Estimated Creatinine Clearance: 66.9 mL/min (by C-G formula based on SCr of 1.12 mg/dL). Liver Function Tests:  Recent Labs Lab 05/02/16 1854 05/03/16 1353  05/05/16 0358 05/08/16 1617  AST 133* 107* 79* 45*  ALT 77* 69* 59 43  ALKPHOS 88 80 88 113  BILITOT 0.9 1.5* 0.8 0.5  PROT 5.6* 5.5* 6.2* 6.6  ALBUMIN 2.9* 2.8* 2.9* 3.4*   No results for input(s): LIPASE, AMYLASE in the last 168 hours. No results for input(s): AMMONIA in the last 168 hours. Coagulation Profile: No results for input(s): INR, PROTIME in the last 168 hours. Cardiac Enzymes: No results for input(s): CKTOTAL, CKMB, CKMBINDEX, TROPONINI in the last 168 hours. BNP (last 3 results) No results for input(s): PROBNP in the last 8760 hours. HbA1C: No results for input(s): HGBA1C in the last 72 hours. CBG:  Recent Labs Lab 05/06/16 0657 05/06/16 0745 05/06/16 1224 05/06/16 1629 05/07/16 0747  GLUCAP 64* 101* 101* 96 85   Lipid Profile: No results for input(s): CHOL, HDL, LDLCALC, TRIG, CHOLHDL, LDLDIRECT in the last 72 hours. Thyroid Function Tests: No results for input(s): TSH, T4TOTAL, FREET4, T3FREE, THYROIDAB in the last 72 hours. Anemia Panel: No results for input(s): VITAMINB12, FOLATE, FERRITIN, TIBC, IRON, RETICCTPCT in the last 72 hours. Urine analysis:    Component Value Date/Time   COLORURINE YELLOW 05/01/2016 0025   APPEARANCEUR CLOUDY (A) 05/01/2016 0025   LABSPEC 1.015 05/01/2016 0025   PHURINE 7.0 05/01/2016 0025   GLUCOSEU NEGATIVE 05/01/2016 0025   HGBUR NEGATIVE 05/01/2016 0025   BILIRUBINUR NEGATIVE 05/01/2016 0025   KETONESUR NEGATIVE 05/01/2016 0025   PROTEINUR NEGATIVE 05/01/2016 0025   UROBILINOGEN 1.0 01/16/2012 1957   NITRITE NEGATIVE 05/01/2016 0025   LEUKOCYTESUR NEGATIVE 05/01/2016 0025   Sepsis Labs: @LABRCNTIP (procalcitonin:4,lacticidven:4) ) Recent Results (from the past 240 hour(s))  MRSA PCR Screening     Status: None   Collection Time: 05/02/16  3:08 AM  Result Value Ref Range Status   MRSA by PCR NEGATIVE NEGATIVE Final    Comment:  The GeneXpert MRSA Assay (FDA approved for NASAL specimens only), is one  component of a comprehensive MRSA colonization surveillance program. It is not intended to diagnose MRSA infection nor to guide or monitor treatment for MRSA infections.   Culture, blood (x 2)     Status: None   Collection Time: 05/02/16  8:19 AM  Result Value Ref Range Status   Specimen Description BLOOD LEFT ARM  Final   Special Requests IN PEDIATRIC BOTTLE 1CC  Final   Culture   Final    NO GROWTH 5 DAYS Performed at [facility], [address], [place], [place] [address]    Report Status 05/07/2016 FINAL  Final  Culture, blood (x 2)     Status: None   Collection Time: 05/02/16  8:19 AM  Result Value Ref Range Status   Specimen Description BLOOD LEFT ARM  Final   Special Requests IN PEDIATRIC BOTTLE 2CC  Final   Culture   Final    NO GROWTH 5 DAYS Performed at [facility], [address] [place], [place] [address]    Report Status 05/07/2016 FINAL  Final         Radiology Studies: No results found.    Scheduled Meds: . benztropine  0.5 mg Oral Daily  . carbamazepine  200 mg Oral BID PC  . feeding supplement (ENSURE ENLIVE)  237 mL Oral BID BM  . haloperidol  5 mg Oral BID  . lamoTRIgine  100 mg Oral BID  . LORazepam  0.5 mg Oral BID  . pantoprazole  40 mg Oral Daily  . polycarbophil  625 mg Oral q morning - 10a  . potassium chloride  10 mEq Oral q morning - 10a  . tamsulosin  0.8 mg Oral Daily   Continuous Infusions:    LOS: 7 days    Time spent in minutes: 35    Kinda Pottle, MD [facility] Pager: [email or web address] Password [email or web address] 05/09/2016, 2:09 PM

## 2016-05-09 NOTE — Progress Notes (Signed)
Patient is in bilateral wrist and ankle as well as waist restraints. Plan is for patient to be discharged to SNF. Restraints removed per MD to meet requirements of SNF admission. Will continue to monitor.   Julio SicksK. Emerson Schreifels RN

## 2016-05-09 NOTE — Progress Notes (Signed)
Upon assessment, pt. Was very agitated and combative with staff. Unable to take restraints off as a SNF requirement. Will attempt to slowly take restraints off through the night. Will continue to monitor closely.

## 2016-05-09 NOTE — Progress Notes (Signed)
[facility] will not be able to return with foley. Patient will need to go to nursing home, PASRR and FL2 is being completed. Patient Legal Guardian-sister updated. [place]. Patient will need to be without a sitter for 24 hours before discharging to SNF.

## 2016-05-09 NOTE — Care Management Note (Signed)
Case Management Note  Patient Details  Name: Antonio Montoya MRN: [id number] Date of Birth: [birth year]  Subjective/Objective: Psych-group home or snf-Psych CSW following.  1:1-restraints.                 Action/Plan:d/c group home.   Expected Discharge Date:   (UNKNOWN)               Expected Discharge Plan:  Group Home  In-House Referral:  Clinical Social Work  Discharge planning Services     Post Acute Care Choice:    Choice offered to:     DME Arranged:    DME Agency:     HH Arranged:    HH Agency:     Status of Service:  Completed, signed off  If discussed at [employer] Meetings, dates discussed:    Additional Comments:  Lanier ClamMahabir, Kylii Ennis, RN 05/09/2016, 1:59 PM

## 2016-05-09 NOTE — Progress Notes (Signed)
PROGRESS NOTE    Antonio Montoya   RUE:[id number]  DOB: 1951-03-10  DOA: 05/01/2016 PCP: Shayne Alken, MD (Inactive)    Brief Narrative:  Antonio Montoya is a 66 y.o. male with DM diet controlled, mental retardation and schizophrenia had come to the ER 2/19 for urinary retention (bladder scan showed > 1 L urine and caretaker stated he had not urinated in 18 hrs). He had Foley catheter placed and sent back to group home.  Patient is unable to give history- per ER HPI, he came if in for hematuria after pulling on his foley cath. He has beem increasingly agitated for a few wks per caregiver. Caregiver stated pt recently had a fit and broke several items including a glass ~ 3 days prior to admission & had swelling to the left hand on the day of admission .  In the ER patient had to be sedated with Ativan. He was found to have hypothermia with temp of 95.7.  This is the 5th visit to the ER since Jan of this year.  1/21- stumbled, fell, abrasion to forehead 2/4- left subconjunctival hemorrhage 2/16- off balance- head CT ok  2/16- U retention  Subjective: Non-verbal  Assessment & Plan:   Principal Problem:   Acute delirium, hypothermia, Urinary retention - due to psych issues and psych medications- psychiatry managing this  - his group home does not want him back -as the foley may be the cause of his increased agitation,  I asked RN to pull foley  - agitation did not improve- foley did have to be re-inserted due to retention and psych consulted for assistance - the following changed made thus far:  decreased Haldol from 25 to 15 QHS and now Haldol 5 mg BID Added 1mg  Ativan QHS  now changed to 0.5 mg BID with 1mg  Q 6 PRN Lamictal decreased from 125 in AM and 150 in PM  to 100  BID  Risperdal ( which was given routine and PRN at home) stopped-  Oxcarbazine stopped Tegretol 200 mg BID added  Cogentin decreased from 1mg  daily to 0.5 mg daily Saphris 5mg  Q12 PRN for agitation added -  following QTc  - hypothermia resolved,  transferred out of SDU but still intermittently agitated and requiring restraints - psych needs to continue to follow and manage this  Active Problems:  U retention/ AKI - initially presented to ER on 2/19- foley placed- started 0.8 mg of Flomax and sent back to Group home - voiding trial 2/21- we were hoping his agitation would resolve with removing foley as well but he failed trial and agitation did not improve - Second voiding trial given on 1/26- failed this one as well - Cr 2/24 rose despite foley- may not be drinking well- improved with slow IVF - per psych, his psych meds may be causing this- medications changes being made by psych    Swelling of left hand - xray does not show fracture --initially suspected to be cellulitis in setting of hypothermia-  given Clinda  in ER and then zosyn & Vanc - Vanc stopped 2/21 and Zosyn continued- hand swelling not improving therefore I doubt it is cellulitis and is likely a traumatic injury-  (based on H and P) -  hand is non tender to touch - stopped Zosyn 2/23 - swelling has resolved after stopping Zosyn    Hypothermia - no source of sepsis found for this- blood cultures negative, UA negative - no endocrine cause found- cortisol and thyroid functions are normal -  suspect poor thermoregulation due to antipsychotics- have spoken with psych about this-  Temps have improved  Elevated LFTs -  CT abd shows no signs of infection- he has a mild CBD dilatation- doubt this is significant - suspect LFTs are elevated due to psych medications/ hypothermia   Hypoglycemia - sugar 22 on 2/23- which was likely not accurate as he has had no other drops < 50- will stop checking  Bradycardia - present since admission - HR 50-60 when awake- drops to 30-40s when he sleeps- BP has been stable  Thrombocytopenia - noted on 2/16 and has progressed- medication related from Zosyn? - stopped Zosyn on 2/23- - following daily-  improved to normal    Pressure injury of skin Pressure Injury 05/01/16 Stage II - left buttock- Partial thickness loss of dermis presenting as a shallow open ulcer with a red, pink wound bed without slough. irregular in size - white granulation tissue to area.     DVT prophylaxis: SCDs Code Status: Full code Family Communication:  Disposition Plan: plan per psych Consultants:   psych Procedures:    Antimicrobials:  Anti-infectives    Start     Dose/Rate Route Frequency Ordered Stop   05/02/16 2000  vancomycin (VANCOCIN) IVPB 750 mg/150 ml premix  Status:  Discontinued     750 mg 150 mL/hr over 60 Minutes Intravenous Every 12 hours 05/02/16 0643 05/03/16 0805   05/02/16 1400  piperacillin-tazobactam (ZOSYN) IVPB 3.375 g  Status:  Discontinued     3.375 g 12.5 mL/hr over 240 Minutes Intravenous Every 8 hours 05/02/16 0532 05/04/16 1125   05/02/16 0415  piperacillin-tazobactam (ZOSYN) IVPB 3.375 g     3.375 g 100 mL/hr over 30 Minutes Intravenous  Once 05/02/16 0407 05/02/16 0553   05/02/16 0415  vancomycin (VANCOCIN) IVPB 1000 mg/200 mL premix     1,000 mg 200 mL/hr over 60 Minutes Intravenous  Once 05/02/16 0407 05/02/16 0745   05/01/16 2200  clindamycin (CLEOCIN) IVPB 600 mg     600 mg 100 mL/hr over 30 Minutes Intravenous  Once 05/01/16 2148 05/02/16 0010       Objective: Vitals:   05/07/16 2102 05/08/16 1232 05/08/16 2121 05/09/16 0519  BP: (!) 181/104 (!) 144/74 (!) 156/89 (!) 157/75  Pulse: 60 (!) 47 (!) 49 (!) 52  Resp:   14 12  Temp:  97.9 F (36.6 C) 98.5 F (36.9 C) 98.7 F (37.1 C)  TempSrc:  Oral Axillary Axillary  SpO2:      Weight:      Height:        Intake/Output Summary (Last 24 hours) at 05/09/16 1259 Last data filed at 05/09/16 0543  Gross per 24 hour  Intake              300 ml  Output             1250 ml  Net             -950 ml   Filed Weights   05/02/16 0640  Weight: 71 kg (156 lb 8.4 oz)    Examination: General exam: alert,  nonverbal, does not follow commands HEENT: PERRLA, oral mucosa moist, no sclera icterus or thrush Respiratory system: Clear to auscultation. Respiratory effort normal. Cardiovascular system: S1 & S2 heard, RRR.  No murmurs  Gastrointestinal system: Abdomen soft, non-tender, nondistended. Normal bowel sound. No organomegaly Central nervous system: moves all extremities Extremities: No cyanosis, clubbing- swelling of left hand resolved Skin: No rashes or  ulcers Psychiatry:  Intermittently agitated requiring sedation    Data Reviewed: I have personally reviewed following labs and imaging studies  CBC:  Recent Labs Lab 05/04/16 0331 05/05/16 0358 05/06/16 0314 05/07/16 0357 05/09/16 0457  WBC 7.0 6.9 7.6 7.6 7.5  HGB 9.5* 9.7* 9.6* 10.4* 11.4*  HCT 27.4* 28.2* 27.4* 30.1* 32.3*  MCV 93.2 93.4 92.9 92.3 91.8  PLT 77* 82* 103* 137* 211   Basic Metabolic Panel:  Recent Labs Lab 05/03/16 1353 05/05/16 0358 05/06/16 0314 05/07/16 0357 05/08/16 1617  NA 144 148* 147* 144 144  K 3.8 3.9 3.7 4.0 3.6  CL 113* 112* 112* 111 107  CO2 26 30 30 28  32  GLUCOSE 71 70 82 73 71  BUN 35* 28* 23* 21* 20  CREATININE 1.24 1.33* 1.18 1.17 1.12  CALCIUM 9.2 9.4 9.4 9.3 9.7   GFR: Estimated Creatinine Clearance: 66.9 mL/min (by C-G formula based on SCr of 1.12 mg/dL). Liver Function Tests:  Recent Labs Lab 05/02/16 1854 05/03/16 1353 05/05/16 0358 05/08/16 1617  AST 133* 107* 79* 45*  ALT 77* 69* 59 43  ALKPHOS 88 80 88 113  BILITOT 0.9 1.5* 0.8 0.5  PROT 5.6* 5.5* 6.2* 6.6  ALBUMIN 2.9* 2.8* 2.9* 3.4*   No results for input(s): LIPASE, AMYLASE in the last 168 hours. No results for input(s): AMMONIA in the last 168 hours. Coagulation Profile: No results for input(s): INR, PROTIME in the last 168 hours. Cardiac Enzymes: No results for input(s): CKTOTAL, CKMB, CKMBINDEX, TROPONINI in the last 168 hours. BNP (last 3 results) No results for input(s): PROBNP in the last 8760  hours. HbA1C: No results for input(s): HGBA1C in the last 72 hours. CBG:  Recent Labs Lab 05/06/16 0657 05/06/16 0745 05/06/16 1224 05/06/16 1629 05/07/16 0747  GLUCAP 64* 101* 101* 96 85   Lipid Profile: No results for input(s): CHOL, HDL, LDLCALC, TRIG, CHOLHDL, LDLDIRECT in the last 72 hours. Thyroid Function Tests: No results for input(s): TSH, T4TOTAL, FREET4, T3FREE, THYROIDAB in the last 72 hours. Anemia Panel: No results for input(s): VITAMINB12, FOLATE, FERRITIN, TIBC, IRON, RETICCTPCT in the last 72 hours. Urine analysis:    Component Value Date/Time   COLORURINE YELLOW 05/01/2016 0025   APPEARANCEUR CLOUDY (A) 05/01/2016 0025   LABSPEC 1.015 05/01/2016 0025   PHURINE 7.0 05/01/2016 0025   GLUCOSEU NEGATIVE 05/01/2016 0025   HGBUR NEGATIVE 05/01/2016 0025   BILIRUBINUR NEGATIVE 05/01/2016 0025   KETONESUR NEGATIVE 05/01/2016 0025   PROTEINUR NEGATIVE 05/01/2016 0025   UROBILINOGEN 1.0 01/16/2012 1957   NITRITE NEGATIVE 05/01/2016 0025   LEUKOCYTESUR NEGATIVE 05/01/2016 0025   Sepsis Labs: @LABRCNTIP (procalcitonin:4,lacticidven:4) ) Recent Results (from the past 240 hour(s))  MRSA PCR Screening     Status: None   Collection Time: 05/02/16  3:08 AM  Result Value Ref Range Status   MRSA by PCR NEGATIVE NEGATIVE Final    Comment:        The GeneXpert MRSA Assay (FDA approved for NASAL specimens only), is one component of a comprehensive MRSA colonization surveillance program. It is not intended to diagnose MRSA infection nor to guide or monitor treatment for MRSA infections.   Culture, blood (x 2)     Status: None   Collection Time: 05/02/16  8:19 AM  Result Value Ref Range Status   Specimen Description BLOOD LEFT ARM  Final   Special Requests IN PEDIATRIC BOTTLE 1CC  Final   Culture   Final    NO GROWTH 5 DAYS Performed  at [facility], [address] [place], [place] [address]    Report Status 05/07/2016 FINAL  Final  Culture, blood (x  2)     Status: None   Collection Time: 05/02/16  8:19 AM  Result Value Ref Range Status   Specimen Description BLOOD LEFT ARM  Final   Special Requests IN PEDIATRIC BOTTLE 2CC  Final   Culture   Final    NO GROWTH 5 DAYS Performed at [facility], [address] [place], [place] [address]    Report Status 05/07/2016 FINAL  Final         Radiology Studies: No results found.    Scheduled Meds: . benztropine  0.5 mg Oral Daily  . carbamazepine  200 mg Oral BID PC  . feeding supplement (ENSURE ENLIVE)  237 mL Oral BID BM  . haloperidol  5 mg Oral BID  . lamoTRIgine  100 mg Oral BID  . LORazepam  0.5 mg Oral BID  . pantoprazole  40 mg Oral Daily  . polycarbophil  625 mg Oral q morning - 10a  . potassium chloride  10 mEq Oral q morning - 10a  . tamsulosin  0.8 mg Oral Daily   Continuous Infusions:    LOS: 7 days    Time spent in minutes: 35    Emilina Smarr, MD [facility] Pager: [email or web address] Password [email or web address] 05/09/2016, 12:59 PM

## 2016-05-10 NOTE — Progress Notes (Addendum)
PROGRESS NOTE    Antonio Montoya   ZOX:[id number]  DOB: Dec 18, 1950  DOA: 05/01/2016 PCP: Shayne Alken, MD (Inactive)    Brief Narrative:  Antonio Montoya is a 66 y.o. male with DM diet controlled, mental retardation and schizophrenia had come to the ER 2/19 for urinary retention (bladder scan showed > 1 L urine and caretaker stated he had not urinated in 18 hrs). He had Foley catheter placed and sent back to group home.  Patient is unable to give history- per ER HPI, he came if in for hematuria after pulling on his foley cath. He has beem increasingly agitated for a few wks per caregiver. Caregiver stated pt recently had a fit and broke several items including a glass ~ 3 days prior to admission & had swelling to the left hand on the day of admission .  In the ER patient had to be sedated with Ativan. He was found to have hypothermia with temp of 95.7.  This is the 5th visit to the ER since Jan of this year.  1/21- stumbled, fell, abrasion to forehead 2/4- left subconjunctival hemorrhage 2/16- off balance- head CT ok  2/16- U retention  Subjective: Non-verbal-   Assessment & Plan:   Principal Problem:   Acute exacerbation of underlying psych issues with new hypothermia & Urinary retention  - his group home does not want him back -as the foley may be the cause of his increased agitation,  I asked RN to pull foley  - agitation did not improve- foley did have to be re-inserted due to retention and psych consulted for assistance - the following changed made thus far by psych:  decreased Haldol from 25 to 15 QHS and now Haldol 5 mg BID Added 1mg  Ativan QHS  now changed to 0.5 mg BID with 1mg  Q 6 PRN Lamictal decreased from 125 in AM and 150 in PM  to 100  BID Risperdal ( which was given routine and PRN at home) stopped-  Oxcarbazine stopped Tegretol 200 mg BID added  Cogentin decreased from 1mg  daily to 0.5 mg daily Saphris 5mg  Q12 PRN for agitation added - following QTc  -  hypothermia resolved,  transferred out of SDU   - 2/28 - spoke with Dr Magdalen Spatz with psych who states that the patient has a traumatic brain injury which is causing his behavioral issues and there are no further medication changes he can make for him -  per RNs, agitation has not resolved. He bites, punches and kicks when taken out of restraints. He received PRN Ativan 1mg  3 times yesterday  Active Problems:  U retention/ AKI - initially presented to ER on 2/19- foley placed- started 0.8 mg of Flomax and sent back to Group home - voiding trial 2/21- we were hoping his agitation would resolve with removing foley as well but he failed trial and agitation did not improve - Second voiding trial given on 1/26- failed this one as well - per Dr Magdalen Spatz, his psych meds may be causing this- medications changes being made by psych  - per Dr Magdalen Spatz today, he has no plans change his psych meds any further because that may cause him to further deteriorate- he states to find other options to treat his U retention - as he has failed a voiding trial twice, I cannot remove catheter at this point- will d/c from hospital with catheter and Flomax- we will make an appt with urology for him where he can have further studies done  Swelling  of left hand - xray does not show fracture --initially suspected to be cellulitis in setting of hypothermia-  given Clinda  in ER and then zosyn & Vanc - Vanc stopped 2/21 and Zosyn continued- hand swelling not improving with Zosyn therefore I doubt it is cellulitis and is likely a traumatic injury-  (based on H and P) -  hand is non tender to touch - stopped Zosyn 2/23 - swelling has resolved after stopping Zosyn    Hypothermia - no source of sepsis found for this- blood cultures negative, UA negative - no endocrine cause found- cortisol and thyroid functions are normal - suspect poor thermoregulation due to antipsychotics- have spoken with psych about this-  Temps  have improved  Elevated LFTs -  CT abd shows no signs of infection- he has a mild CBD dilatation- doubt this is significant - suspect LFTs are elevated due to psych medications/ hypothermia   Hypoglycemia - sugar 22 on 2/23- which was likely not accurate as he has had no other drops < 50- will stop checking  Bradycardia - present since admission - HR 50-60 when awake- drops to 30-40s when he sleeps- BP has been stable  Thrombocytopenia - noted on 2/16 and has progressed- medication related from Zosyn? - stopped Zosyn on 2/23- - following daily- improved to normal    Pressure injury of skin Pressure Injury 05/01/16 Stage II - left buttock- Partial thickness loss of dermis presenting as a shallow open ulcer with a red, pink wound bed without slough. irregular in size - white granulation tissue to area.     DVT prophylaxis: SCDs Code Status: Full code Family Communication:  Disposition Plan: plan per psych- psych social worked working on disposition as his Group home will not take him back Consultants:   psych Procedures:    Antimicrobials:  Anti-infectives    Start     Dose/Rate Route Frequency Ordered Stop   05/02/16 2000  vancomycin (VANCOCIN) IVPB 750 mg/150 ml premix  Status:  Discontinued     750 mg 150 mL/hr over 60 Minutes Intravenous Every 12 hours 05/02/16 0643 05/03/16 0805   05/02/16 1400  piperacillin-tazobactam (ZOSYN) IVPB 3.375 g  Status:  Discontinued     3.375 g 12.5 mL/hr over 240 Minutes Intravenous Every 8 hours 05/02/16 0532 05/04/16 1125   05/02/16 0415  piperacillin-tazobactam (ZOSYN) IVPB 3.375 g     3.375 g 100 mL/hr over 30 Minutes Intravenous  Once 05/02/16 0407 05/02/16 0553   05/02/16 0415  vancomycin (VANCOCIN) IVPB 1000 mg/200 mL premix     1,000 mg 200 mL/hr over 60 Minutes Intravenous  Once 05/02/16 0407 05/02/16 0745   05/01/16 2200  clindamycin (CLEOCIN) IVPB 600 mg     600 mg 100 mL/hr over 30 Minutes Intravenous  Once 05/01/16 2148  05/02/16 0010       Objective: Vitals:   05/09/16 0519 05/09/16 1300 05/09/16 2300 05/10/16 0552  BP: (!) 157/75 117/60 124/77 (!) 145/80  Pulse: (!) 52 66 (!) 58 (!) 55  Resp: 12 16 15 16   Temp: 98.7 F (37.1 C)  98.4 F (36.9 C) 98.1 F (36.7 C)  TempSrc: Axillary Axillary Axillary Axillary  SpO2:   97% 99%  Weight:      Height:        Intake/Output Summary (Last 24 hours) at 05/10/16 1200 Last data filed at 05/10/16 0750  Gross per 24 hour  Intake  880 ml  Output              560 ml  Net              320 ml   Filed Weights   05/02/16 0640  Weight: 71 kg (156 lb 8.4 oz)    Examination: General exam: alert, nonverbal, does not follow commands HEENT: PERRLA, oral mucosa moist, no sclera icterus or thrush Respiratory system: Clear to auscultation. Respiratory effort normal. Cardiovascular system: S1 & S2 heard, RRR.  No murmurs  Gastrointestinal system: Abdomen soft, non-tender, nondistended. Normal bowel sound. No organomegaly Central nervous system: moves all extremities Extremities: No cyanosis, clubbing- swelling of left hand resolved Skin: No rashes or ulcers Psychiatry:  Intermittently agitated requiring sedation    Data Reviewed: I have personally reviewed following labs and imaging studies  CBC:  Recent Labs Lab 05/04/16 0331 05/05/16 0358 05/06/16 0314 05/07/16 0357 05/09/16 0457  WBC 7.0 6.9 7.6 7.6 7.5  HGB 9.5* 9.7* 9.6* 10.4* 11.4*  HCT 27.4* 28.2* 27.4* 30.1* 32.3*  MCV 93.2 93.4 92.9 92.3 91.8  PLT 77* 82* 103* 137* 211   Basic Metabolic Panel:  Recent Labs Lab 05/03/16 1353 05/05/16 0358 05/06/16 0314 05/07/16 0357 05/08/16 1617  NA 144 148* 147* 144 144  K 3.8 3.9 3.7 4.0 3.6  CL 113* 112* 112* 111 107  CO2 26 30 30 28  32  GLUCOSE 71 70 82 73 71  BUN 35* 28* 23* 21* 20  CREATININE 1.24 1.33* 1.18 1.17 1.12  CALCIUM 9.2 9.4 9.4 9.3 9.7   GFR: Estimated Creatinine Clearance: 66.9 mL/min (by C-G formula based  on SCr of 1.12 mg/dL). Liver Function Tests:  Recent Labs Lab 05/03/16 1353 05/05/16 0358 05/08/16 1617  AST 107* 79* 45*  ALT 69* 59 43  ALKPHOS 80 88 113  BILITOT 1.5* 0.8 0.5  PROT 5.5* 6.2* 6.6  ALBUMIN 2.8* 2.9* 3.4*   No results for input(s): LIPASE, AMYLASE in the last 168 hours. No results for input(s): AMMONIA in the last 168 hours. Coagulation Profile: No results for input(s): INR, PROTIME in the last 168 hours. Cardiac Enzymes: No results for input(s): CKTOTAL, CKMB, CKMBINDEX, TROPONINI in the last 168 hours. BNP (last 3 results) No results for input(s): PROBNP in the last 8760 hours. HbA1C: No results for input(s): HGBA1C in the last 72 hours. CBG:  Recent Labs Lab 05/06/16 0657 05/06/16 0745 05/06/16 1224 05/06/16 1629 05/07/16 0747  GLUCAP 64* 101* 101* 96 85   Lipid Profile: No results for input(s): CHOL, HDL, LDLCALC, TRIG, CHOLHDL, LDLDIRECT in the last 72 hours. Thyroid Function Tests: No results for input(s): TSH, T4TOTAL, FREET4, T3FREE, THYROIDAB in the last 72 hours. Anemia Panel: No results for input(s): VITAMINB12, FOLATE, FERRITIN, TIBC, IRON, RETICCTPCT in the last 72 hours. Urine analysis:    Component Value Date/Time   COLORURINE YELLOW 05/01/2016 0025   APPEARANCEUR CLOUDY (A) 05/01/2016 0025   LABSPEC 1.015 05/01/2016 0025   PHURINE 7.0 05/01/2016 0025   GLUCOSEU NEGATIVE 05/01/2016 0025   HGBUR NEGATIVE 05/01/2016 0025   BILIRUBINUR NEGATIVE 05/01/2016 0025   KETONESUR NEGATIVE 05/01/2016 0025   PROTEINUR NEGATIVE 05/01/2016 0025   UROBILINOGEN 1.0 01/16/2012 1957   NITRITE NEGATIVE 05/01/2016 0025   LEUKOCYTESUR NEGATIVE 05/01/2016 0025   Sepsis Labs: @LABRCNTIP (procalcitonin:4,lacticidven:4) ) Recent Results (from the past 240 hour(s))  MRSA PCR Screening     Status: None   Collection Time: 05/02/16  3:08 AM  Result Value Ref Range Status  MRSA by PCR NEGATIVE NEGATIVE Final    Comment:        The GeneXpert MRSA  Assay (FDA approved for NASAL specimens only), is one component of a comprehensive MRSA colonization surveillance program. It is not intended to diagnose MRSA infection nor to guide or monitor treatment for MRSA infections.   Culture, blood (x 2)     Status: None   Collection Time: 05/02/16  8:19 AM  Result Value Ref Range Status   Specimen Description BLOOD LEFT ARM  Final   Special Requests IN PEDIATRIC BOTTLE 1CC  Final   Culture   Final    NO GROWTH 5 DAYS Performed at [facility], [address] [place], [place] [address]    Report Status 05/07/2016 FINAL  Final  Culture, blood (x 2)     Status: None   Collection Time: 05/02/16  8:19 AM  Result Value Ref Range Status   Specimen Description BLOOD LEFT ARM  Final   Special Requests IN PEDIATRIC BOTTLE 2CC  Final   Culture   Final    NO GROWTH 5 DAYS Performed at [facility], [address] [place], [place] [address]    Report Status 05/07/2016 FINAL  Final         Radiology Studies: No results found.    Scheduled Meds: . benztropine  0.5 mg Oral Daily  . carbamazepine  200 mg Oral BID PC  . feeding supplement (ENSURE ENLIVE)  237 mL Oral BID BM  . haloperidol  5 mg Oral BID  . lamoTRIgine  100 mg Oral BID  . LORazepam  0.5 mg Oral BID  . pantoprazole  40 mg Oral Daily  . polycarbophil  625 mg Oral q morning - 10a  . potassium chloride  10 mEq Oral q morning - 10a  . tamsulosin  0.8 mg Oral Daily   Continuous Infusions:    LOS: 8 days    Time spent in minutes: 35    Terrance Usery, MD [facility] Pager: [email or web address] Password [email or web address] 05/10/2016, 12:00 PM

## 2016-05-11 NOTE — Progress Notes (Signed)
Pt has a rectal temp of 94.0. Paged on call AToniann Fail. Kakrakandy. Verbal; given to use 'bear hugger'. Advised that we do not have 'bear hugger' on this unit, pt would need to go to step down unit for that. Per AToniann Fail. Kakrakandy, step down unit does not have any beds available. No other orders given.  Placed warm blankets on pt. Will continue to monitor. Antonio HurterKeishonna Analyah Mcconnon

## 2016-05-11 NOTE — Progress Notes (Signed)
CSW continues to follow for discharge planning - will need to be out of restraints for 24 hours and PASARR before patient can be discharged to SNF as group home is unable to take patient with a foley catheter.    Lincoln MaxinKelly Torrin Frein, LCSW [facility] Clinical Social Worker cell #: [phone]

## 2016-05-11 NOTE — Progress Notes (Signed)
PT Cancellation Note  Patient Details Name: Mart PiggsCharlie Dinsmore MRN: [id number] DOB: 1950-04-08   Cancelled Treatment:    Reason Eval/Treat Not Completed: Patient not medically ready. RN states remains in restraints and afraid patinet will become more agitated. Will check back tomorrow.    [name], Belicia Difatta Elizabeth 05/11/2016, 12:02 PM Blanchard KelchKaren Nollan Muldrow PT [phone]

## 2016-05-11 NOTE — Progress Notes (Signed)
Patient Demographics:    Antonio Montoya, is a 66 y.o. male, DOB - 14-Mar-1950, ZOX:[id number]  Admit date - 05/01/2016   Admitting Physician Eduard Clos, MD  Outpatient Primary MD for the patient is EGE,HILMI, MD (Inactive)  LOS - 9   Chief Complaint  Patient presents with  . Hematuria        Subjective:    Antonio Montoya today has no fevers, no emesis,  No chest pain,  Remains confused, disorientated   Assessment  & Plan :    Principal Problem:   Acute delirium Active Problems:   Pressure injury of skin   Hypothermia   Elevated LFTs   Urinary retention   Thrombocytopenia (HCC)  Brief Narrative:  Antonio Cassadyis a 66 y.o.malewith DM diet controlled, mental retardation and schizophrenia had come to the ER 2/19 for urinary retention (bladder scan showed > 1 L urine and caretaker stated he had not urinated in 18 hrs). He had Foley catheter placed and sent back to group home. Acute Exacerbation of underlying psych issues with new hypothermia & Urinary retention.  Patient is unable to give history- per ER HPI, he came if in for hematuria after pulling on his foley cath. He has beem increasingly agitated for a few wks per caregiver.Caregiver stated pt recently had a fit and broke several items including a glass ~ 3 days prior to admission & had swelling to the left hand on the day of admission .  In the ER patient had to be sedated with Ativan. He was found to have hypothermia with temp of 95.7.  This is the 5th visit to the ER since Jan of this year.  1/21- stumbled, fell, abrasion to forehead 2/4- left subconjunctival hemorrhage 2/16- off balance- head CT ok  2/16- U retention   Assessment & Plan:   1)Acute Exacerbation of underlying psych issues with new hypothermia - his group home (caretaker) does not want him back Due to behavioral concerns. Psychiatrist adjusted his  medications :-  The following Medication Changes made thus far by psych:  decreased Haldol from 25 to 15 QHS and now Haldol 5 mg BID, Added 1mg  Ativan QHS  now changed to 0.5 mg BID with 1mg  Q 6 PRN. Lamictal decreased from 125 in AM and 150 in PM  to 100  BID. Risperdal ( which was given routine and PRN at home) Oxcarbazine stopped, Tegretol 200 mg BID added,  Cogentin decreased from 1mg  daily to 0.5 mg daily, Saphris 5mg  Q12 PRN for agitation added, follow QTc   2)Urinary Retention- agitation did not improve after foley was removed,  He failed voiding trial, foley did have to be re-inserted due to retention. Dr Butler [name] spoke with Dr Magdalen Spatz. 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   per RNs, agitation has not resolved. He bites, punches and kicks when taken out of restraints. He has PRN Ativan 1mg  3 times.  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 Antonio Montoya apparently took care of him for last 5 yrs. according to patient's sister is his current behavior is baseline for him. It is difficult to anticipate any improvement  5)Disposition-  For skilled nursing facility to take patient patient has to be off restraints on the 24 hours  6)Swelling of left hand- - xray does not show fracture, initially suspected to be cellulitis in setting of  hypothermia-  given Clinda  in ER and then zosyn & Vanc - Vanc stopped 2/21 and Zosyn continued- hand swelling not improving with Zosyn therefore I doubt it is cellulitis and is likely a traumatic injury-  (based on H and P) -  hand is non tender to touch - stopped Zosyn 2/23 - swelling has resolved after stopping Zosyn  7)Elevated LFTs/Thrombocytopenia- -  CT abd shows no signs of infection- he has a mild CBD dilatation- doubt this is significant,  suspect LFTs are elevated due to psych medications/ hypothermia   8)Pressure injury of skin- Pressure Injury 05/01/16 Stage II - left buttock- Partial thickness loss of dermis presenting as a shallow open ulcer with a red, pink wound bed without slough. irregular in size - white granulation tissue to area.     DVT prophylaxis: SCDs Code Status: Full code Family Communication:  Disposition Plan: plan per psych- psych social worked working on disposition as his Group home will NOT take him back, will need to be out of restraints for 24 hours and PASARR before patient can be discharged to SNF as group home is unable to take patient with a foley catheter.   Lab Results  Component Value Date   PLT 211 05/09/2016    Inpatient Medications  Scheduled Meds: . benztropine  0.5 mg Oral Daily  . carbamazepine  200 mg Oral BID PC  . feeding supplement (ENSURE ENLIVE)  237 mL Oral BID BM  . haloperidol  5 mg Oral BID  . lamoTRIgine  100 mg Oral BID  . LORazepam  0.5 mg Oral BID  . pantoprazole  40 mg Oral Daily  . polycarbophil  625 mg Oral q morning - 10a  . potassium chloride  10 mEq Oral q morning - 10a  . tamsulosin  0.8 mg Oral Daily   Continuous Infusions: PRN Meds:.acetaminophen **OR** acetaminophen, asenapine, LORazepam, ondansetron **OR** ondansetron (ZOFRAN) IV    Anti-infectives    Start     Dose/Rate Route Frequency Ordered Stop   05/02/16 2000  vancomycin (VANCOCIN) IVPB 750 mg/150 ml premix  Status:  Discontinued     750  mg 150 mL/hr over 60 Minutes Intravenous Every 12 hours 05/02/16 0643 05/03/16 0805   05/02/16 1400  piperacillin-tazobactam (ZOSYN) IVPB 3.375 g  Status:  Discontinued     3.375 g 12.5 mL/hr over 240 Minutes Intravenous Every 8 hours 05/02/16 0532 05/04/16 1125   05/02/16 0415  piperacillin-tazobactam (ZOSYN) IVPB 3.375 g     3.375 g 100 mL/hr over 30 Minutes Intravenous  Once 05/02/16 0407 05/02/16 0553   05/02/16 0415  vancomycin (VANCOCIN) IVPB 1000 mg/200 mL premix     1,000 mg 200 mL/hr over 60 Minutes Intravenous  Once 05/02/16 0407  05/02/16 0745   05/01/16 2200  clindamycin (CLEOCIN) IVPB 600 mg     600 mg 100 mL/hr over 30 Minutes Intravenous  Once 05/01/16 2148 05/02/16 0010        Objective:   Vitals:   05/11/16 0832 05/11/16 0833 05/11/16 1422 05/11/16 1600  BP:  132/62 128/82   Pulse:   73   Resp:   18   Temp: 97.1 F (36.2 C)   97.3 F (36.3 C)  TempSrc: Axillary   Axillary  SpO2:      Weight:      Height:        Wt Readings from Last 3 Encounters:  05/02/16 71 kg (156 lb 8.4 oz)     Intake/Output Summary (Last 24 hours) at 05/11/16 1834 Last data filed at 05/11/16 1830  Gross per 24 hour  Intake             1110 ml  Output              925 ml  Net              185 ml     Physical Exam  Gen:- Awake , Restless and not cooperative HEENT:- [place].AT, No sclera icterus Neck-Supple Neck,No JVD,.  Lungs-  CTAB  CV- S1, S2 normal Abd-  +ve B.Sounds, Abd Soft, No tenderness,    Extremity/Skin:- No  edema,    Neuropsych-combative    Data Review:   Micro Results Recent Results (from the past 240 hour(s))  MRSA PCR Screening     Status: None   Collection Time: 05/02/16  3:08 AM  Result Value Ref Range Status   MRSA by PCR NEGATIVE NEGATIVE Final    Comment:        The GeneXpert MRSA Assay (FDA approved for NASAL specimens only), is one component of a comprehensive MRSA colonization surveillance program. It is not intended to diagnose  MRSA infection nor to guide or monitor treatment for MRSA infections.   Culture, blood (x 2)     Status: None   Collection Time: 05/02/16  8:19 AM  Result Value Ref Range Status   Specimen Description BLOOD LEFT ARM  Final   Special Requests IN PEDIATRIC BOTTLE 1CC  Final   Culture   Final    NO GROWTH 5 DAYS Performed at [facility], [address], [place], [place] [address]    Report Status 05/07/2016 FINAL  Final  Culture, blood (x 2)     Status: None   Collection Time: 05/02/16  8:19 AM  Result Value Ref Range Status   Specimen Description BLOOD LEFT ARM  Final   Special Requests IN PEDIATRIC BOTTLE 2CC  Final   Culture   Final    NO GROWTH 5 DAYS Performed at [facility], [address], [place], [place] [address]    Report Status 05/07/2016 FINAL  Final  Culture, blood (routine x 2)     Status: None (Preliminary result)   Collection Time: 05/10/16 11:00 PM  Result Value Ref Range Status   Specimen Description BLOOD LEFT ARM  Final   Special Requests IN PEDIATRIC BOTTLE 1.5 CC  Final   Culture   Final    NO GROWTH < 12 HOURS Performed at [facility], [address], [place], [place] [address]    Report Status PENDING  Incomplete  Culture, blood (routine x 2)     Status: None (Preliminary result)   Collection  Time: 05/10/16 11:00 PM  Result Value Ref Range Status   Specimen Description BLOOD RIGHT FOREARM  Final   Special Requests IN PEDIATRIC BOTTLE 3 CC  Final   Culture   Final    NO GROWTH < 12 HOURS Performed at [facility], [address], [place], [place] [address]    Report Status PENDING  Incomplete    Radiology Reports Ct Head Wo Contrast  Result Date: 05/01/2016 CLINICAL DATA:  Patient is agitated with delirium. History of schizophrenia and mental retardation. EXAM: CT HEAD WITHOUT CONTRAST TECHNIQUE: Contiguous axial images were obtained from the base of the skull through the vertex without intravenous contrast. COMPARISON:   04/27/2016. FINDINGS: Brain: No evidence of acute infarction, hemorrhage, hydrocephalus, extra-axial collection or mass lesion/mass effect. Vascular: No hyperdense vessel or unexpected calcification. Skull: Normal. Negative for fracture or focal lesion. Sinuses/Orbits: Globes orbits are unremarkable. Mild right maxillary sinus mucosal thickening. Sinuses otherwise essentially clear. Clear mastoid air cells. Other: None. IMPRESSION: 1. No acute intracranial abnormalities. No change from the prior head CT. Electronically Signed   By: Amie Portland M.D.   On: 05/01/2016 19:55   Ct Head Wo Contrast  Result Date: 04/27/2016 CLINICAL DATA:  Initial evaluation for balance issues. EXAM: CT HEAD WITHOUT CONTRAST TECHNIQUE: Contiguous axial images were obtained from the base of the skull through the vertex without intravenous contrast. COMPARISON:  Prior CT 10/22/2003. FINDINGS: Brain: Study limited by patient positioning. Atrophy with mild chronic small vessel ischemic disease. No acute intracranial hemorrhage. No evidence for acute large vessel territory infarct. No mass lesion, midline shift or mass effect. No hydrocephalus. No extra-axial fluid collection. Vascular: No hyperdense vessel. Skull: Scalp soft tissues demonstrate no acute abnormality. Calvarium intact. Sinuses/Orbits: Globes and orbital soft tissues within normal limits. Paranasal sinuses and mastoid air cells are clear. IMPRESSION: 1. No acute intracranial process. 2. Generalized age-related cerebral atrophy with mild chronic small vessel ischemic disease. Electronically Signed   By: Rise Mu M.D.   On: 04/27/2016 22:36   Ct Abdomen W Contrast  Result Date: 05/03/2016 CLINICAL DATA:  Urinary retention and hematuria after pulling out a Foley catheter. EXAM: CT ABDOMEN WITH CONTRAST TECHNIQUE: Multidetector CT imaging of the abdomen was performed using the standard protocol following bolus administration of intravenous contrast. CONTRAST:   ISOVUE-300 IOPAMIDOL (ISOVUE-300) INJECTION 61% COMPARISON:  CT scan from 2012. FINDINGS: Lower chest: The lung bases demonstrate bilateral effusions and bibasilar infiltrates. The heart is enlarged but stable. There is tortuosity and mild ectasia of the thoracic aorta. Hepatobiliary: Diffuse fatty infiltration of the liver but no focal hepatic lesions. The gallbladder is grossly normal. Mild common bile duct dilatation without obvious cause. Maximum diameter is 8 mm. Recommend correlation with liver function studies. MRCP would not be an option for this patient. Pancreas: Grossly normal. Spleen: Grossly normal. Adrenals/Urinary Tract: The adrenal glands and kidneys are grossly normal. No hydronephrosis. Small renal cysts. Stomach/Bowel: Grossly normal.  Moderate stool in the colon. Vascular/Lymphatic: The aorta and branch vessels are patent. 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 Possible web or focal dissection in the left iliac artery but it is not completely covered. Other: No ascites. Musculoskeletal: No significant bony findings. IMPRESSION: Limited examination due to patient motion. Small bilateral pleural effusions and bibasilar infiltrates. Mild common bile duct and cystic duct dilatation without obvious cause. The common bile duct tapers normally to the duodenum. Recommend correlation with liver function studies. Patient would not  be a candidate for MRCP. No acute abdominal findings, mass lesions or adenopathy. Electronically Signed   By: Rudie MeyerP.  Gallerani M.D.   On: 05/03/2016 15:17   Dg Chest Port 1 View  Result Date: 05/02/2016 CLINICAL DATA:  Sepsis EXAM: PORTABLE CHEST 1 VIEW COMPARISON:  Chest radiograph 06/17/2011 FINDINGS: Cardiac silhouette is enlarged mildly. There is no focal airspace consolidation or pulmonary edema. No pneumothorax or sizable pleural effusion. IMPRESSION: No focal airspace disease. Electronically Signed   By: Deatra RobinsonKevin  Herman M.D.    On: 05/02/2016 04:48   Dg Hand Complete Left  Result Date: 05/01/2016 CLINICAL DATA:  LT hand injury x 3 days ago with swelling on today. Caregiver states pt recently had a fit and broke several items including a glass. Pt has HX: Mental Retardation and Schizophrenia. Pt was combative prior to xray and was sedated in order to get xrays. 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 EXAM: LEFT HAND - COMPLETE 3+ VIEW COMPARISON:  None. FINDINGS: No fracture or dislocation. 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 There is narrowing of the radiocarpal joint between the radius and scaphoid, with mild subchondral sclerosis. Soft tissue swelling is noted most evident dorsally. No soft tissue air or radiopaque foreign body. IMPRESSION: 1. No acute fracture.  No dislocation. 2. 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 3. Nonspecific soft tissue swelling most evident dorsally. Electronically Signed   By: Amie Portlandavid  Ormond M.D.   On: 05/01/2016 18:14     CBC  Recent Labs Lab 05/05/16 0358 05/06/16 0314 05/07/16 0357 05/09/16 0457  WBC 6.9 7.6 7.6 7.5  HGB 9.7* 9.6* 10.4* 11.4*  HCT 28.2* 27.4* 30.1* 32.3*  PLT 82* 103* 137* 211  MCV 93.4 92.9 92.3 91.8  MCH 32.1 32.5 31.9 32.4  MCHC 34.4 35.0 34.6 35.3  RDW 13.4 13.4 13.2 13.5    Chemistries   Recent Labs Lab 05/05/16 0358 05/06/16 0314 05/07/16 0357 05/08/16 1617  NA 148* 147* 144 144  K 3.9 3.7 4.0 3.6  CL 112* 112* 111 107  CO2 30 30 28  32  GLUCOSE 70 82 73 71  BUN 28* 23* 21* 20  CREATININE 1.33* 1.18 1.17 1.12  CALCIUM 9.4 9.4 9.3 9.7  AST 79*  --   --  45*  ALT 59  --   --  43  ALKPHOS 88  --   --  113  BILITOT 0.8  --   --  0.5   ------------------------------------------------------------------------------------------------------------------ No results for input(s): CHOL, HDL, LDLCALC,  TRIG, CHOLHDL, LDLDIRECT in the last 72 hours.  No results found for: HGBA1C ------------------------------------------------------------------------------------------------------------------ No results for input(s): TSH, T4TOTAL, T3FREE, THYROIDAB in the last 72 hours.  Invalid input(s): FREET3 ------------------------------------------------------------------------------------------------------------------ No results for input(s): VITAMINB12, FOLATE, FERRITIN, TIBC, IRON, RETICCTPCT in the last 72 hours.  Coagulation profile No results for input(s): INR, PROTIME in the last 168 hours.  No results for input(s): DDIMER in the last 72 hours.  Cardiac Enzymes No results for input(s): CKMB, TROPONINI, MYOGLOBIN in the last 168 hours.  Invalid input(s): CK ------------------------------------------------------------------------------------------------------------------ No results found for: BNP   Kimberley Speece M.D on 05/11/2016 at 6:34 PM  Between 7am to 7pm - Pager - [phone]  After 7pm go to [email or web address] - password [email or web address]  [facility] -  Office  [phone]  Dragon dictation system was used to create this note, attempts have been made to correct errors, however presence  of uncorrected errors is not a reflection quality of care provided

## 2016-05-12 LAB — BLOOD CULTURE ID PANEL (REFLEXED)
Acinetobacter baumannii: NOT DETECTED
CANDIDA KRUSEI: NOT DETECTED
CANDIDA PARAPSILOSIS: NOT DETECTED
CANDIDA TROPICALIS: NOT DETECTED
Candida albicans: NOT DETECTED
Candida glabrata: NOT DETECTED
Enterobacter cloacae complex: NOT DETECTED
Enterobacteriaceae species: NOT DETECTED
Enterococcus species: NOT DETECTED
Escherichia coli: NOT DETECTED
HAEMOPHILUS INFLUENZAE: NOT DETECTED
KLEBSIELLA OXYTOCA: NOT DETECTED
Klebsiella pneumoniae: NOT DETECTED
Listeria monocytogenes: NOT DETECTED
Neisseria meningitidis: NOT DETECTED
PROTEUS SPECIES: NOT DETECTED
Pseudomonas aeruginosa: NOT DETECTED
SERRATIA MARCESCENS: NOT DETECTED
STAPHYLOCOCCUS AUREUS BCID: NOT DETECTED
STAPHYLOCOCCUS SPECIES: NOT DETECTED
STREPTOCOCCUS SPECIES: NOT DETECTED
Streptococcus agalactiae: NOT DETECTED
Streptococcus pneumoniae: NOT DETECTED
Streptococcus pyogenes: NOT DETECTED

## 2016-05-12 NOTE — Progress Notes (Signed)
Pt is very agitated, restless, and combative intermittently on today. PRN IV Ativan and Sublingual Saphis administered also this am. Was unable to place telemetry back on pt after incontinent episode of BM today, MD paged to obtain order to D/C telemetry.

## 2016-05-12 NOTE — Progress Notes (Addendum)
PT Cancellation Note  Patient Details Name: Antonio Montoya MRN: [id number] DOB:Antonio Montoya [birth year]   Cancelled Treatment:    Reason Eval/Treat Not Completed: Spoke with nursing who reports pt remains combative. He is still in restraints and has a Comptrollersitter. Will continue to hold PT for now. Will attempt eval once pt is more able/willing to participate/cooperate. Thanks.   Addendum: Attempted PT eval since family was present. Pt agitated, flailing LEs and attempting to remove restraints. Pt was not following commands from family or therapist. Deferred PT eval for safety reasons. Will continue to check back for now.    Rebeca AlertJannie Jake Fuhrmann, MPT Pager: [phone]

## 2016-05-12 NOTE — Progress Notes (Signed)
Patient Demographics:    Antonio Montoya, is a 66 y.o. male, DOB - 29-Jun-1950, ZOX:[id number]  Admit date - 05/01/2016   Admitting Physician Eduard Clos, MD  Outpatient Primary MD for the patient is EGE,HILMI, MD (Inactive)  LOS - 10   Chief Complaint  Patient presents with  . Hematuria        Subjective:    Antonio Montoya today has no fevers, no emesis,  No chest pain,  Sister (Belinda) at bedside   Assessment  & Plan :    Principal Problem:   Acute delirium Active Problems:   Pressure injury of skin   Hypothermia   Elevated LFTs   Urinary retention   Thrombocytopenia (HCC)   Brief Narrative:  Antonio Cassadyis a 66 y.o.malewith DM diet controlled, mental retardation and schizophrenia had come to the ER 2/19 for urinary retention (bladder scan showed > 1 L urine and caretaker stated he had not urinated in 18 hrs). He had Foley catheter placed and sent back to group home. Acute Exacerbation of underlying psych issues with new hypothermia & Urinary retention.  Patient is unable to give history- per ER HPI, he came if in for hematuria after pulling on his foley cath. He has beem increasingly agitated for a few wks per caregiver.Caregiver stated pt recently had a fit and broke several items including a glass ~ 3 days prior to admission & had swelling to the left hand on the day of admission .  In the ER patient had to be sedated with Ativan. He was found to have hypothermia with temp of 95.7.  This is the 5th visit to the ER since Jan of this year.  1/21- stumbled, fell, abrasion to forehead 2/4- left subconjunctival hemorrhage 2/16- off balance- head CT ok  2/16- U retention   Assessment & Plan:   1)Acute Exacerbation of underlying psych issues with new hypothermia - his group home (caretaker) does not want him back Due to behavioral concerns. Psychiatrist adjusted his  medications :-  The following Medication Changes made thus far by psych:  decreased Haldol from 25 to 15 QHS and now Haldol 5 mg BID, Added 1mg  Ativan QHS  now changed to 0.5 mg BID with 1mg  Q 6 PRN. Lamictal decreased from 125 in AM and 150 in PM  to 100  BID. Risperdal ( which was given routine and PRN at home) Oxcarbazine stopped, Tegretol 200 mg BID added,  Cogentin decreased from 1mg  daily to 0.5 mg daily, Saphris 5mg  Q12 PRN for agitation added, follow QTc   2)Urinary Retention- agitation did not improve after foley was removed,  He failed voiding trial, foley did have to be re-inserted due to retention. Dr Butler [name] spoke with Dr Magdalen Spatz. 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   per RNs, agitation has not resolved. He bites, punches and kicks when taken out of restraints. He has PRN Ativan 1mg   3 times. 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 Mr Anselm Lisnoch apparently took care of him for last 5 yrs. according to patient's sister is his current behavior is baseline for him. It is difficult to anticipate any improvement  5)Disposition-  For skilled nursing facility to take patient patient has to be off restraints on the 24 hours  6)Swelling of left hand- - xray does not show fracture, initially suspected to be cellulitis in setting of  hypothermia-  given Clinda  in ER and then zosyn & Vanc - Vanc stopped 2/21 and Zosyn continued- hand swelling not improving with Zosyn therefore I doubt it is cellulitis and is likely a traumatic injury-  (based on H and P) -  hand is non tender to touch - stopped Zosyn 2/23 - swelling has resolved after stopping Zosyn  7)Elevated LFTs/Thrombocytopenia- -  CT abd shows no signs of infection- he has a mild CBD dilatation- doubt this is significant,  suspect LFTs are elevated due to psych medications/ hypothermia . ThromboCytopenia has resolved  8)Pressure injury of skin- Pressure Injury 05/01/16 Stage II - left buttock- Partial thickness loss of dermis presenting as a shallow open ulcer with a red, pink wound bed without slough. irregular in size - white granulation tissue to area.    9)ID- 1 of 2 blood cultures from 05/10/2016 with gram-positive in clusters suspected contaminant   DVT prophylaxis: SCDs Code Status: Full code Family Communication:  Disposition Plan: plan per psych- psych social worked working on disposition as his Group home will NOT take him back, will need to be out of restraints for 24 hours and PASARR before patient can be discharged to SNF as group home is unable to take patient with a foley catheter.  Lab Results  Component Value Date   PLT 211 05/09/2016    Inpatient Medications  Scheduled Meds: . benztropine  0.5 mg Oral Daily  . carbamazepine  200 mg Oral BID PC  . feeding supplement (ENSURE ENLIVE)  237 mL Oral BID BM  . haloperidol  5 mg Oral BID  . lamoTRIgine  100 mg Oral BID  . LORazepam  0.5 mg Oral BID  . pantoprazole  40 mg Oral Daily  . polycarbophil  625 mg Oral q morning - 10a  . potassium chloride  10 mEq Oral q morning - 10a  . tamsulosin  0.8 mg Oral Daily   Continuous Infusions: PRN Meds:.acetaminophen **OR** acetaminophen, asenapine, LORazepam, ondansetron **OR** ondansetron (ZOFRAN) IV    Anti-infectives    Start     Dose/Rate  Route Frequency Ordered Stop   05/02/16 2000  vancomycin (VANCOCIN) IVPB 750 mg/150 ml premix  Status:  Discontinued     750 mg 150 mL/hr over 60 Minutes Intravenous Every 12 hours 05/02/16 0643 05/03/16 0805   05/02/16 1400  piperacillin-tazobactam (ZOSYN) IVPB 3.375 g  Status:  Discontinued     3.375 g 12.5 mL/hr over 240 Minutes Intravenous Every 8 hours 05/02/16 0532 05/04/16 1125   05/02/16 0415  piperacillin-tazobactam (ZOSYN) IVPB 3.375 g     3.375 g 100 mL/hr over 30 Minutes Intravenous  Once 05/02/16 0407 05/02/16 0553   05/02/16 0415  vancomycin (VANCOCIN) IVPB 1000  mg/200 mL premix     1,000 mg 200 mL/hr over 60 Minutes Intravenous  Once 05/02/16 0407 05/02/16 0745   05/01/16 2200  clindamycin (CLEOCIN) IVPB 600 mg     600 mg 100 mL/hr over 30 Minutes Intravenous  Once 05/01/16 2148 05/02/16 0010        Objective:   Vitals:   05/11/16 1600 05/11/16 2152 05/12/16 0600 05/12/16 1355  BP:  112/72 (!) 125/96 (!) 139/56  Pulse:  72 78 64  Resp:  16 16 16   Temp: 97.3 F (36.3 C) (!) 96.9 F (36.1 C) (!) 96.1 F (35.6 C) 97.7 F (36.5 C)  TempSrc: Axillary Axillary Axillary Oral  SpO2:  100% 99% 99%  Weight:      Height:        Wt Readings from Last 3 Encounters:  05/02/16 71 kg (156 lb 8.4 oz)     Intake/Output Summary (Last 24 hours) at 05/12/16 1849 Last data filed at 05/12/16 1400  Gross per 24 hour  Intake              240 ml  Output             1450 ml  Net            -1210 ml     Physical Exam  Gen:- Awake , Agitated and not cooperative HEENT:- [place].AT, No sclera icterus Neck-Supple Neck,No JVD,.  Lungs-  CTAB  CV- S1, S2 normal Abd-  +ve B.Sounds, Abd Soft, No tenderness,    Extremity/Skin:- No  edema,    Gu- Foley catheter    Data Review:   Micro Results Recent Results (from the past 240 hour(s))  Culture, blood (routine x 2)     Status: None (Preliminary result)   Collection Time: 05/10/16 11:00 PM  Result Value Ref Range Status    Specimen Description BLOOD LEFT ARM  Final   Special Requests IN PEDIATRIC BOTTLE 1.5 CC  Final   Culture   Final    NO GROWTH 1 DAY Performed at [facility], [address] [place], [place] [address]    Report Status PENDING  Incomplete  Culture, blood (routine x 2)     Status: None (Preliminary result)   Collection Time: 05/10/16 11:00 PM  Result Value Ref Range Status   Specimen Description BLOOD RIGHT FOREARM  Final   Special Requests IN PEDIATRIC BOTTLE 3 CC  Final   Culture  Setup Time   Final    GRAM POSITIVE COCCI IN CLUSTERS IN PEDIATRIC BOTTLE Organism ID to follow CRITICAL RESULT CALLED TO, READ BACK BY AND VERIFIED WITH: T. GREEN, [facility] AT [facility], 05/12/16 AT 1738 BY J FUDESCO    Culture GRAM POSITIVE COCCI  Final   Report Status PENDING  Incomplete  Blood Culture ID Panel (Reflexed)     Status: None   Collection Time: 05/10/16 11:00 PM  Result Value Ref Range Status   Enterococcus species NOT DETECTED NOT DETECTED Final   Listeria monocytogenes NOT DETECTED NOT DETECTED Final   Staphylococcus species NOT DETECTED NOT DETECTED Final   Staphylococcus aureus NOT DETECTED NOT DETECTED Final   Streptococcus species NOT DETECTED NOT DETECTED Final   Streptococcus agalactiae NOT DETECTED NOT DETECTED Final   Streptococcus pneumoniae NOT DETECTED NOT DETECTED Final   Streptococcus pyogenes NOT DETECTED NOT DETECTED Final   Acinetobacter baumannii NOT DETECTED NOT DETECTED Final   Enterobacteriaceae species NOT DETECTED NOT DETECTED Final   Enterobacter cloacae complex  NOT DETECTED NOT DETECTED Final   Escherichia coli NOT DETECTED NOT DETECTED Final   Klebsiella oxytoca NOT DETECTED NOT DETECTED Final   Klebsiella pneumoniae NOT DETECTED NOT DETECTED Final   Proteus species NOT DETECTED NOT DETECTED Final   Serratia marcescens NOT DETECTED NOT DETECTED Final   Haemophilus influenzae NOT DETECTED NOT DETECTED Final   Neisseria meningitidis NOT DETECTED NOT DETECTED  Final   Pseudomonas aeruginosa NOT DETECTED NOT DETECTED Final   Candida albicans NOT DETECTED NOT DETECTED Final   Candida glabrata NOT DETECTED NOT DETECTED Final   Candida krusei NOT DETECTED NOT DETECTED Final   Candida parapsilosis NOT DETECTED NOT DETECTED Final   Candida tropicalis NOT DETECTED NOT DETECTED Final    Radiology Reports Ct Head Wo Contrast  Result Date: 05/01/2016 CLINICAL DATA:  Patient is agitated with delirium. History of schizophrenia and mental retardation. EXAM: CT HEAD WITHOUT CONTRAST TECHNIQUE: Contiguous axial images were obtained from the base of the skull through the vertex without intravenous contrast. COMPARISON:  04/27/2016. FINDINGS: Brain: No evidence of acute infarction, hemorrhage, hydrocephalus, extra-axial collection or mass lesion/mass effect. Vascular: No hyperdense vessel or unexpected calcification. Skull: Normal. Negative for fracture or focal lesion. Sinuses/Orbits: Globes orbits are unremarkable. Mild right maxillary sinus mucosal thickening. Sinuses otherwise essentially clear. Clear mastoid air cells. Other: None. IMPRESSION: 1. No acute intracranial abnormalities. No change from the prior head CT. Electronically Signed   By: Amie Portland M.D.   On: 05/01/2016 19:55   Ct Head Wo Contrast  Result Date: 04/27/2016 CLINICAL DATA:  Initial evaluation for balance issues. EXAM: CT HEAD WITHOUT CONTRAST TECHNIQUE: Contiguous axial images were obtained from the base of the skull through the vertex without intravenous contrast. COMPARISON:  Prior CT 10/22/2003. FINDINGS: Brain: Study limited by patient positioning. Atrophy with mild chronic small vessel ischemic disease. No acute intracranial hemorrhage. No evidence for acute large vessel territory infarct. No mass lesion, midline shift or mass effect. No hydrocephalus. No extra-axial fluid collection. Vascular: No hyperdense vessel. Skull: Scalp soft tissues demonstrate no acute abnormality. Calvarium  intact. Sinuses/Orbits: Globes and orbital soft tissues within normal limits. Paranasal sinuses and mastoid air cells are clear. IMPRESSION: 1. No acute intracranial process. 2. Generalized age-related cerebral atrophy with mild chronic small vessel ischemic disease. Electronically Signed   By: Rise Mu M.D.   On: 04/27/2016 22:36   Ct Abdomen W Contrast  Result Date: 05/03/2016 CLINICAL DATA:  Urinary retention and hematuria after pulling out a Foley catheter. EXAM: CT ABDOMEN WITH CONTRAST TECHNIQUE: Multidetector CT imaging of the abdomen was performed using the standard protocol following bolus administration of intravenous contrast. CONTRAST:  ISOVUE-300 IOPAMIDOL (ISOVUE-300) INJECTION 61% COMPARISON:  CT scan from 2012. FINDINGS: Lower chest: The lung bases demonstrate bilateral effusions and bibasilar infiltrates. The heart is enlarged but stable. There is tortuosity and mild ectasia of the thoracic aorta. Hepatobiliary: Diffuse fatty infiltration of the liver but no focal hepatic lesions. The gallbladder is grossly normal. Mild common bile duct dilatation without obvious cause. Maximum diameter is 8 mm. Recommend correlation with liver function studies. MRCP would not be an option for this patient. Pancreas: Grossly normal. Spleen: Grossly normal. Adrenals/Urinary Tract: The adrenal glands and kidneys are grossly normal. No hydronephrosis. Small renal cysts. Stomach/Bowel: Grossly normal.  Moderate stool in the colon. Vascular/Lymphatic: The aorta and branch vessels are patent. 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 Possible web or focal dissection in the left iliac artery  but it is not completely covered. Other: No ascites. Musculoskeletal: No significant bony findings. IMPRESSION: Limited examination due to patient motion. Small bilateral pleural effusions and bibasilar infiltrates. Mild common bile duct and cystic duct dilatation without  obvious cause. The common bile duct tapers normally to the duodenum. Recommend correlation with liver function studies. Patient would not be a candidate for MRCP. No acute abdominal findings, mass lesions or adenopathy. Electronically Signed   By: Rudie Meyer M.D.   On: 05/03/2016 15:17   Dg Chest Port 1 View  Result Date: 05/02/2016 CLINICAL DATA:  Sepsis EXAM: PORTABLE CHEST 1 VIEW COMPARISON:  Chest radiograph 06/17/2011 FINDINGS: Cardiac silhouette is enlarged mildly. There is no focal airspace consolidation or pulmonary edema. No pneumothorax or sizable pleural effusion. IMPRESSION: No focal airspace disease. Electronically Signed   By: Deatra Robinson M.D.   On: 05/02/2016 04:48   Dg Hand Complete Left  Result Date: 05/01/2016 CLINICAL DATA:  LT hand injury x 3 days ago with swelling on today. Caregiver states pt recently had a fit and broke several items including a glass. Pt has HX: Mental Retardation and Schizophrenia. Pt was combative prior to xray and was sedated in order to get xrays. 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 EXAM: LEFT HAND - COMPLETE 3+ VIEW COMPARISON:  None. FINDINGS: No fracture or dislocation. 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 There is narrowing of the radiocarpal joint between the radius and scaphoid, with mild subchondral sclerosis. Soft tissue swelling is noted most evident dorsally. No soft tissue air or radiopaque foreign body. IMPRESSION: 1. No acute fracture.  No dislocation. 2. 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 3. Nonspecific soft tissue swelling most evident dorsally. Electronically Signed   By: Amie Portland M.D.   On: 05/01/2016 18:14     CBC  Recent Labs Lab 05/06/16 0314 05/07/16 0357 05/09/16 0457  WBC 7.6 7.6 7.5  HGB 9.6* 10.4* 11.4*  HCT 27.4* 30.1* 32.3*  PLT 103* 137* 211  MCV 92.9 92.3  91.8  MCH 32.5 31.9 32.4  MCHC 35.0 34.6 35.3  RDW 13.4 13.2 13.5    Chemistries   Recent Labs Lab 05/06/16 0314 05/07/16 0357 05/08/16 1617  NA 147* 144 144  K 3.7 4.0 3.6  CL 112* 111 107  CO2 30 28 32  GLUCOSE 82 73 71  BUN 23* 21* 20  CREATININE 1.18 1.17 1.12  CALCIUM 9.4 9.3 9.7  AST  --   --  45*  ALT  --   --  43  ALKPHOS  --   --  113  BILITOT  --   --  0.5   ------------------------------------------------------------------------------------------------------------------ No results for input(s): CHOL, HDL, LDLCALC, TRIG, CHOLHDL, LDLDIRECT in the last 72 hours.  No results found for: HGBA1C ------------------------------------------------------------------------------------------------------------------ No results for input(s): TSH, T4TOTAL, T3FREE, THYROIDAB in the last 72 hours.  Invalid input(s): FREET3 ------------------------------------------------------------------------------------------------------------------ No results for input(s): VITAMINB12, FOLATE, FERRITIN, TIBC, IRON, RETICCTPCT in the last 72 hours.  Coagulation profile No results for input(s): INR, PROTIME in the last 168 hours.  No results for input(s): DDIMER in the last 72 hours.  Cardiac Enzymes No results for input(s): CKMB, TROPONINI, MYOGLOBIN in the last 168 hours.  Invalid input(s): CK ------------------------------------------------------------------------------------------------------------------ No results found for: BNP   Latasha Buczkowski M.D on 05/12/2016 at 6:49 PM  Between 7am to 7pm - Pager - [phone]  After 7pm  go to [email or web address] - password [email or web address]  [facility] -  Office  [phone]  Dragon dictation system was used to create this note, attempts have been made to correct errors, however presence of uncorrected errors is not a reflection quality of care provided

## 2016-05-12 NOTE — Progress Notes (Signed)
PHARMACY - PHYSICIAN COMMUNICATION CRITICAL VALUE ALERT - BLOOD CULTURE IDENTIFICATION (BCID)  Results for orders placed or performed during the hospital encounter of 05/01/16  Blood Culture ID Panel (Reflexed) (Collected: 05/10/2016 11:00 PM)  Result Value Ref Range   Enterococcus species NOT DETECTED NOT DETECTED   Listeria monocytogenes NOT DETECTED NOT DETECTED   Staphylococcus species NOT DETECTED NOT DETECTED   Staphylococcus aureus NOT DETECTED NOT DETECTED   Streptococcus species NOT DETECTED NOT DETECTED   Streptococcus agalactiae NOT DETECTED NOT DETECTED   Streptococcus pneumoniae NOT DETECTED NOT DETECTED   Streptococcus pyogenes NOT DETECTED NOT DETECTED   Acinetobacter baumannii NOT DETECTED NOT DETECTED   Enterobacteriaceae species NOT DETECTED NOT DETECTED   Enterobacter cloacae complex NOT DETECTED NOT DETECTED   Escherichia coli NOT DETECTED NOT DETECTED   Klebsiella oxytoca NOT DETECTED NOT DETECTED   Klebsiella pneumoniae NOT DETECTED NOT DETECTED   Proteus species NOT DETECTED NOT DETECTED   Serratia marcescens NOT DETECTED NOT DETECTED   Haemophilus influenzae NOT DETECTED NOT DETECTED   Neisseria meningitidis NOT DETECTED NOT DETECTED   Pseudomonas aeruginosa NOT DETECTED NOT DETECTED   Candida albicans NOT DETECTED NOT DETECTED   Candida glabrata NOT DETECTED NOT DETECTED   Candida krusei NOT DETECTED NOT DETECTED   Candida parapsilosis NOT DETECTED NOT DETECTED   Candida tropicalis NOT DETECTED NOT DETECTED    Name of physician (or Provider) Contacted: Emokpae  Changes to prescribed antibiotics required: No current abx, no change  Otho BellowsGreen, Devery Odwyer L 05/12/2016  5:41 PM

## 2016-05-13 NOTE — Progress Notes (Signed)
Patient Demographics:    Antonio Montoya, is a 66 y.o. male, DOB - 1950-06-04, EAV:[id number]  Admit date - 05/01/2016   Admitting Physician Eduard ClosArshad N Kakrakandy, MD  Outpatient Primary MD for the patient is EGE,HILMI, MD (Inactive)  LOS - 11   Chief Complaint  Patient presents with  . Hematuria        Subjective:    Antonio PiggsCharlie Montoya today has no fevers, no emesis,  No chest pain,  Remains confused/agitated   Assessment  & Plan :    Principal Problem:   Acute delirium Active Problems:   Pressure injury of skin   Hypothermia   Elevated LFTs   Urinary retention   Thrombocytopenia (HCC)  Brief Narrative: Antonio GoslingCharlie Cassadyis a 66 y.o.malewith DM diet controlled, mental retardation and schizophrenia had come to the ER 2/19 for urinary retention (bladder scan showed >1 L urine and caretaker stated he had not urinated in 18 hrs). He had Foley catheter placed and sent back to group home. Acute Exacerbation of underlying psych issues with newhypothermia &Urinary retention.  Patient is unable to give history- per ER HPI, he came if in for hematuria after pulling on his foley cath. He has beem increasingly agitated for a few wks per caregiver.Caregiver stated pt recently had a fit and broke several items including a glass ~ 3 days prior to admission &had swelling to the left hand on the day of admission .  In the ER patient had to be sedated with Ativan. He was found to have hypothermia with temp of 95.7.  This is the 5th visit to the ER since Jan of this year.  1/21- stumbled, fell, abrasion to forehead 2/4- left subconjunctival hemorrhage 2/16- off balance- head CT ok  2/16- U retention   Assessment & Plan:  1)Acute Exacerbation of underlying psych issues with newhypothermia - his group home (caretaker) does not want him back Due to behavioral concerns. Psychiatrist adjusted his medications  :-  The following Medication Changes made thus far by psych:  decreased Haldol from 25 to 15 QHS and now Haldol 5 mg BID, Added 1mg  Ativan QHS now changed to 0.5 mg BID with 1mg  Q 6 PRN. Lamictal decreased from 125 in AM and 150 in PM to 100 BID. Risperdal ( which was given routine and PRN at home) Oxcarbazine stopped, Tegretol 200 mg BID added, Cogentin decreased from 1mg  daily to 0.5 mg daily, Saphris 5mg  Q12 PRN for agitation added, follow QTc   2)Urinary Retention- agitation did not improve after foley was removed,  He failed voiding trial, foley did have to be re-inserted due to retention. Dr Butler Denmarkizwan spoke with Dr Magdalen SpatzJonalaggada. 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  per RNs, agitation has not resolved. He bites, punches and kicks when taken out of restraints. He has PRN Ativan 1mg  3 times. 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 Mr Anselm Lis apparently took care of him for last 5 yrs. according to patient's sister is his current behavior is baseline for him. It is difficult to anticipate any improvement  5)Disposition-  For skilled nursing facility to take patient patient has to be off restraints on the 24 hours  6)Swelling of left hand- - xray does not show fracture, initially suspected to be cellulitis in setting of hypothermia-  given Clinda in ER and then zosyn &Vanc - Vanc stopped 2/21 and Zosyn continued- hand swelling not improving with ZosynthereforeI doubt it is cellulitis and is likely a traumatic injury- (based on H and P) - hand is non tender to touch - stopped Zosyn 2/23 - swelling has resolved after stopping Zosyn  7)Elevated LFTs/Thrombocytopenia- - CT abd shows no signs of infection- he has a mild CBD dilatation- doubt this is significant,  suspect LFTs are elevated due to psych medications/ hypothermia . ThromboCytopenia has resolved  8)Pressure injury of skin- Pressure Injury 05/01/16 Stage II - left buttock- Partial thickness loss of dermis presenting as a shallow open ulcer with a red, pink wound bed without slough. irregular in size - white granulation tissue to area.   9)ID- 1 of 2 blood cultures from 05/10/2016 with gram-positive in clusters suspected contaminant  DVT prophylaxis:SCDs Code Status:Full code Family Communication: Disposition Plan:plan per psych- psych social worked working on disposition as his Group home will NOT take him back, will need to be out of restraints for 24 hours and PASARR before patient can be discharged to SNF as group home is unable to take patient with a foley catheter.   Lab Results  Component Value Date   PLT 211 05/09/2016    Inpatient Medications  Scheduled Meds: . benztropine  0.5 mg Oral Daily  . carbamazepine  200 mg Oral BID PC  . feeding supplement (ENSURE ENLIVE)  237 mL Oral BID BM  . haloperidol  5 mg Oral BID  . lamoTRIgine  100 mg Oral BID  . LORazepam  0.5 mg Oral BID  . pantoprazole  40 mg Oral Daily  . polycarbophil  625 mg Oral q morning - 10a  . potassium chloride  10 mEq Oral q morning - 10a  . tamsulosin  0.8 mg Oral Daily   Continuous Infusions: PRN Meds:.acetaminophen **OR** acetaminophen, asenapine, LORazepam, ondansetron **OR** ondansetron (ZOFRAN) IV    Anti-infectives    Start     Dose/Rate Route  Frequency Ordered Stop   05/02/16 2000  vancomycin (VANCOCIN) IVPB 750 mg/150 ml premix  Status:  Discontinued     750 mg 150 mL/hr over 60 Minutes Intravenous Every 12 hours 05/02/16 0643 05/03/16 0805   05/02/16 1400  piperacillin-tazobactam (ZOSYN) IVPB 3.375 g  Status:  Discontinued     3.375 g 12.5 mL/hr over 240 Minutes Intravenous Every 8 hours 05/02/16 0532 05/04/16 1125   05/02/16 0415  piperacillin-tazobactam (ZOSYN) IVPB 3.375 g     3.375 g 100 mL/hr over 30 Minutes Intravenous  Once 05/02/16 0407 05/02/16 0553   05/02/16 0415  vancomycin (VANCOCIN) IVPB 1000 mg/200 mL premix     1,000 mg 200 mL/hr over 60 Minutes Intravenous  Once 05/02/16 0407 05/02/16 0745   05/01/16 2200  clindamycin (CLEOCIN) IVPB 600 mg  600 mg 100 mL/hr over 30 Minutes Intravenous  Once 05/01/16 2148 05/02/16 0010        Objective:   Vitals:   05/12/16 0600 05/12/16 1355 05/12/16 2205 05/13/16 0649  BP: (!) 125/96 (!) 139/56 (!) 149/86 (!) 124/58  Pulse: 78 64  62  Resp: 16 16 17 16   Temp: (!) 96.1 F (35.6 C) 97.7 F (36.5 C) 97.6 F (36.4 C)   TempSrc: Axillary Oral Axillary   SpO2: 99% 99%  100%  Weight:      Height:        Wt Readings from Last 3 Encounters:  05/02/16 71 kg (156 lb 8.4 oz)     Intake/Output Summary (Last 24 hours) at 05/13/16 0842 Last data filed at 05/13/16 0748  Gross per 24 hour  Intake              840 ml  Output             1000 ml  Net             -160 ml     Physical Exam  Gen:- Awake , Restless and not cooperative HEENT:- [place].AT, No sclera icterus Neck-Supple Neck,No JVD,.  Lungs-  CTAB  CV- S1, S2 normal Abd-  +ve B.Sounds, Abd Soft, No tenderness,    Extremity/Skin:- Left upper extremity swelling is better    Data Review:   Micro Results Recent Results (from the past 240 hour(s))  Culture, blood (routine x 2)     Status: None (Preliminary result)   Collection Time: 05/10/16 11:00 PM  Result Value Ref Range Status   Specimen  Description BLOOD LEFT ARM  Final   Special Requests IN PEDIATRIC BOTTLE 1.5 CC  Final   Culture   Final    NO GROWTH 1 DAY Performed at [facility], [address], [place], [place] [address]    Report Status PENDING  Incomplete  Culture, blood (routine x 2)     Status: None (Preliminary result)   Collection Time: 05/10/16 11:00 PM  Result Value Ref Range Status   Specimen Description BLOOD RIGHT FOREARM  Final   Special Requests IN PEDIATRIC BOTTLE 3 CC  Final   Culture  Setup Time   Final    GRAM POSITIVE COCCI IN CLUSTERS IN PEDIATRIC BOTTLE Organism ID to follow CRITICAL RESULT CALLED TO, READ BACK BY AND VERIFIED WITH: T. GREEN, [facility] AT [facility], 05/12/16 AT 1738 BY J FUDESCO    Culture GRAM POSITIVE COCCI  Final   Report Status PENDING  Incomplete  Blood Culture ID Panel (Reflexed)     Status: None   Collection Time: 05/10/16 11:00 PM  Result Value Ref Range Status   Enterococcus species NOT DETECTED NOT DETECTED Final   Listeria monocytogenes NOT DETECTED NOT DETECTED Final   Staphylococcus species NOT DETECTED NOT DETECTED Final   Staphylococcus aureus NOT DETECTED NOT DETECTED Final   Streptococcus species NOT DETECTED NOT DETECTED Final   Streptococcus agalactiae NOT DETECTED NOT DETECTED Final   Streptococcus pneumoniae NOT DETECTED NOT DETECTED Final   Streptococcus pyogenes NOT DETECTED NOT DETECTED Final   Acinetobacter baumannii NOT DETECTED NOT DETECTED Final   Enterobacteriaceae species NOT DETECTED NOT DETECTED Final   Enterobacter cloacae complex NOT DETECTED NOT DETECTED Final   Escherichia coli NOT DETECTED NOT DETECTED Final   Klebsiella oxytoca NOT DETECTED NOT DETECTED Final   Klebsiella pneumoniae NOT DETECTED NOT DETECTED Final   Proteus species NOT DETECTED NOT  DETECTED Final   Serratia marcescens NOT DETECTED NOT DETECTED Final   Haemophilus influenzae NOT DETECTED NOT DETECTED Final   Neisseria meningitidis NOT DETECTED NOT DETECTED Final    Pseudomonas aeruginosa NOT DETECTED NOT DETECTED Final   Candida albicans NOT DETECTED NOT DETECTED Final   Candida glabrata NOT DETECTED NOT DETECTED Final   Candida krusei NOT DETECTED NOT DETECTED Final   Candida parapsilosis NOT DETECTED NOT DETECTED Final   Candida tropicalis NOT DETECTED NOT DETECTED Final    Radiology Reports Ct Head Wo Contrast  Result Date: 05/01/2016 CLINICAL DATA:  Patient is agitated with delirium. History of schizophrenia and mental retardation. EXAM: CT HEAD WITHOUT CONTRAST TECHNIQUE: Contiguous axial images were obtained from the base of the skull through the vertex without intravenous contrast. COMPARISON:  04/27/2016. FINDINGS: Brain: No evidence of acute infarction, hemorrhage, hydrocephalus, extra-axial collection or mass lesion/mass effect. Vascular: No hyperdense vessel or unexpected calcification. Skull: Normal. Negative for fracture or focal lesion. Sinuses/Orbits: Globes orbits are unremarkable. Mild right maxillary sinus mucosal thickening. Sinuses otherwise essentially clear. Clear mastoid air cells. Other: None. IMPRESSION: 1. No acute intracranial abnormalities. No change from the prior head CT. Electronically Signed   By: Amie Portland M.D.   On: 05/01/2016 19:55   Ct Head Wo Contrast  Result Date: 04/27/2016 CLINICAL DATA:  Initial evaluation for balance issues. EXAM: CT HEAD WITHOUT CONTRAST TECHNIQUE: Contiguous axial images were obtained from the base of the skull through the vertex without intravenous contrast. COMPARISON:  Prior CT 10/22/2003. FINDINGS: Brain: Study limited by patient positioning. Atrophy with mild chronic small vessel ischemic disease. No acute intracranial hemorrhage. No evidence for acute large vessel territory infarct. No mass lesion, midline shift or mass effect. No hydrocephalus. No extra-axial fluid collection. Vascular: No hyperdense vessel. Skull: Scalp soft tissues demonstrate no acute abnormality. Calvarium intact.  Sinuses/Orbits: Globes and orbital soft tissues within normal limits. Paranasal sinuses and mastoid air cells are clear. IMPRESSION: 1. No acute intracranial process. 2. Generalized age-related cerebral atrophy with mild chronic small vessel ischemic disease. Electronically Signed   By: Rise Mu M.D.   On: 04/27/2016 22:36   Ct Abdomen W Contrast  Result Date: 05/03/2016 CLINICAL DATA:  Urinary retention and hematuria after pulling out a Foley catheter. EXAM: CT ABDOMEN WITH CONTRAST TECHNIQUE: Multidetector CT imaging of the abdomen was performed using the standard protocol following bolus administration of intravenous contrast. CONTRAST:  ISOVUE-300 IOPAMIDOL (ISOVUE-300) INJECTION 61% COMPARISON:  CT scan from 2012. FINDINGS: Lower chest: The lung bases demonstrate bilateral effusions and bibasilar infiltrates. The heart is enlarged but stable. There is tortuosity and mild ectasia of the thoracic aorta. Hepatobiliary: Diffuse fatty infiltration of the liver but no focal hepatic lesions. The gallbladder is grossly normal. Mild common bile duct dilatation without obvious cause. Maximum diameter is 8 mm. Recommend correlation with liver function studies. MRCP would not be an option for this patient. Pancreas: Grossly normal. Spleen: Grossly normal. Adrenals/Urinary Tract: The adrenal glands and kidneys are grossly normal. No hydronephrosis. Small renal cysts. Stomach/Bowel: Grossly normal.  Moderate stool in the colon. Vascular/Lymphatic: The aorta and branch vessels are patent. 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 Possible web or focal dissection in the left iliac artery but it is not completely covered. Other: No ascites. Musculoskeletal: No significant bony findings. IMPRESSION: Limited examination due to patient motion. Small bilateral pleural effusions and bibasilar infiltrates. Mild common bile duct and cystic duct dilatation without obvious  cause. The common bile duct tapers normally to the duodenum. Recommend correlation with liver function studies. Patient would not be a candidate for MRCP. No acute abdominal findings, mass lesions or adenopathy. Electronically Signed   By: Rudie Meyer M.D.   On: 05/03/2016 15:17   Dg Chest Port 1 View  Result Date: 05/02/2016 CLINICAL DATA:  Sepsis EXAM: PORTABLE CHEST 1 VIEW COMPARISON:  Chest radiograph 06/17/2011 FINDINGS: Cardiac silhouette is enlarged mildly. There is no focal airspace consolidation or pulmonary edema. No pneumothorax or sizable pleural effusion. IMPRESSION: No focal airspace disease. Electronically Signed   By: Deatra Robinson M.D.   On: 05/02/2016 04:48   Dg Hand Complete Left  Result Date: 05/01/2016 CLINICAL DATA:  LT hand injury x 3 days ago with swelling on today. Caregiver states pt recently had a fit and broke several items including a glass. Pt has HX: Mental Retardation and Schizophrenia. Pt was combative prior to xray and was sedated in order to get xrays. 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 EXAM: LEFT HAND - COMPLETE 3+ VIEW COMPARISON:  None. FINDINGS: No fracture or dislocation. 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 There is narrowing of the radiocarpal joint between the radius and scaphoid, with mild subchondral sclerosis. Soft tissue swelling is noted most evident dorsally. No soft tissue air or radiopaque foreign body. IMPRESSION: 1. No acute fracture.  No dislocation. 2. 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 3. Nonspecific soft tissue swelling most evident dorsally. Electronically Signed   By: Amie Portland M.D.   On: 05/01/2016 18:14     CBC  Recent Labs Lab 05/07/16 0357 05/09/16 0457  WBC 7.6 7.5  HGB 10.4* 11.4*  HCT 30.1* 32.3*  PLT 137* 211  MCV 92.3 91.8  MCH 31.9 32.4  MCHC 34.6 35.3  RDW 13.2  13.5    Chemistries   Recent Labs Lab 05/07/16 0357 05/08/16 1617  NA 144 144  K 4.0 3.6  CL 111 107  CO2 28 32  GLUCOSE 73 71  BUN 21* 20  CREATININE 1.17 1.12  CALCIUM 9.3 9.7  AST  --  45*  ALT  --  43  ALKPHOS  --  113  BILITOT  --  0.5   ------------------------------------------------------------------------------------------------------------------ No results for input(s): CHOL, HDL, LDLCALC, TRIG, CHOLHDL, LDLDIRECT in the last 72 hours.  No results found for: HGBA1C ------------------------------------------------------------------------------------------------------------------ No results for input(s): TSH, T4TOTAL, T3FREE, THYROIDAB in the last 72 hours.  Invalid input(s): FREET3 ------------------------------------------------------------------------------------------------------------------ No results for input(s): VITAMINB12, FOLATE, FERRITIN, TIBC, IRON, RETICCTPCT in the last 72 hours.  Coagulation profile No results for input(s): INR, PROTIME in the last 168 hours.  No results for input(s): DDIMER in the last 72 hours.  Cardiac Enzymes No results for input(s): CKMB, TROPONINI, MYOGLOBIN in the last 168 hours.  Invalid input(s): CK ------------------------------------------------------------------------------------------------------------------ No results found for: BNP   Darrly Loberg M.D on 05/13/2016 at 8:42 AM  Between 7am to 7pm - Pager - [phone]  After 7pm go to [email or web address] - password [email or web address]  [facility] -  Office  [phone]  Dragon dictation system was used to create this note, attempts have been made to correct errors, however presence of uncorrected errors is not a reflection quality of care provided

## 2016-05-14 LAB — CULTURE, BLOOD (ROUTINE X 2)

## 2016-05-14 NOTE — Progress Notes (Signed)
Patient with a rectal temp of 94.2 and unable to obtain an oral or axillary temp. MD made aware. Patient is stable. Orders received to place several warm blankets on the patient and continue to assess temperature. This is not a new problem with the patient as he has occasional hypothermia r/t medications.

## 2016-05-14 NOTE — Progress Notes (Signed)
Nutrition Follow-up  DOCUMENTATION CODES:   Not applicable  INTERVENTION:  - Continue Ensure Enlive BID. - Continued meal assistance. - RD will continue to monitor for additional needs.  NUTRITION DIAGNOSIS:   Biting/chewing difficulty related to other (see comment) (no teeth or dentures) as evidenced by other (see comment) (RN report). -ongoing with need for Dysphagia 3 diet.   GOAL:   Patient will meet greater than or equal to 90% of their needs -met on average.   MONITOR:   PO intake, Supplement acceptance, Weight trends, Labs, Skin, I & O's  ASSESSMENT:   66 y.o. male with mental retardation and schizophrenia had come to the ER 2 days ago for urinary retention and had Foley catheter placed and sent back to group home. Patient was found to be increasingly agitated. Patient also had hurt his left hand in trying to hit something. Patient was brought back to the ER because of increasing difficulty in managing with agitation. In the ER patient had to be sedated with Ativan.   3/5 No new weight since admission. Pt continues to be disoriented x4. Noted that PT unable to work with pt this AM d/t agitation/combativeness. Noted d/c plan/issues surrounding d/c. Per flow sheet, pt consuming 50-100% of meals, on average, since last assessment. He has been consuming Ensure Enlive ~75% of the time.   Medications reviewed; 40 mg oral Protonix/day, 625 mg Fibercon/day, 10 mEq oral KCl/day.  Labs reviewed; AST slightly elevated.    2/27 - No new weight since admission.  - Pt is disoriented x4 and unable to provide information.  - Per chart review, he ate 75% of eggs and oatmeal for breakfast and 100% of macaroni and cheese for lunch on 2/25.  - Today he ate 50% of breakfast which consisted of grits, scrambled eggs, a piece of turkey sausage, blueberry muffin, orange juice, coffee, and milk.  - For lunch he ate 75% of pork loin with gravy, mashed potatoes with gravy, green beans, a roll, ice  cream, iced tea, milk, and ice cream.    2/21 - Pt sleeping at time of visit with no family/visitors present.  - Spoke with RN who reports pt unable to having a meaningful conversation.  - She reports he ate scrambled eggs, applesauce, and drank juice for breakfast; he was unable to eat the bacon d/t having no teeth or dentures. - Lunch tray arrived while RD was in the room performing physical assessment; RD informed RN of this.  - Physical assessment limited to upper body only as pt is on Bair Hugger at this time. -  No muscle or fat wasting noted to upper body.  - No weight hx available from PTA.   IVF:NS @ 100 mL/hr.     Diet Order:  DIET DYS 3 Room service appropriate? Yes; Fluid consistency: Thin  Skin:  Reviewed, no issues (Stage 2 R hip pressure injury)  Last BM:  3/3  Height:   Ht Readings from Last 1 Encounters:  05/02/16 6' (1.829 m)    Weight:   Wt Readings from Last 1 Encounters:  05/02/16 156 lb 8.4 oz (71 kg)    Ideal Body Weight:  80.91 kg  BMI:  Body mass index is 21.23 kg/m.  Estimated Nutritional Needs:   Kcal:  1630-1845 (23-26 kcal/kg)  Protein:  70-85 grams (1-1.2 grams/kg)  Fluid:     EDUCATION NEEDS:   No education needs identified at this time     , MS, RD, LDN, CNSC Inpatient Clinical   Dietitian Pager # [phone] After hours/weekend pager # [phone]

## 2016-05-14 NOTE — Progress Notes (Addendum)
PT Cancellation/Sign Off Note  Patient Details Name: Antonio Montoya MRN: [id number] DOB: 09/19/1950   Cancelled Treatment:    Reason Eval/Treat Not Completed: Medical issues which prohibited therapy. Pt remains agitated/combative with nursing. Spoke with RN who recommended PT continued to be held. 3rd attempt for PT evaluation. Pt is not appropriate for PT services at this time. Will sign off. Please reorder once pt is appropriate and/or restraints are able to be safely removed and he is able to participate. Thanks.    Rebeca AlertJannie Yasheka Fossett, MPT Pager: [phone]

## 2016-05-14 NOTE — Consult Note (Signed)
New Urology Consult Note   Requesting Attending Physician:  Shon Haleourage Emokpae, MD Service Providing Consult: Urology Consulting Attending: Ervin Rothbauer/Ottelin  Assessment:  Patient is a 66 y.o. male with mental retardation and schizophrenia with one episode of acute urinary retention 5 years ago presents with agitation and acute urinary retention requiring Foley catheter placement.  He is failed 2 trials of voids during this hospitalization per report.  Creatinine is normal at 1.1,his upper urinary tract appears normal on CT scan.  Recommendations: 1. Continue Flomax 0.8 mg 2.  Patient presents a complex social dilemma.  Would likely benefit from Foley catheterization for the immediate future, however prolonged Foley catheterization and/or suprapubic catheter would likely not be in his best interest due to agitation and risk of trauma due to the patient pulling on the catheters. We could consider cystoscopic evaluation with possible transurethral resection of the prostate as an alternative however this will need to be further discussed with Dr. Vernie Ammonsttelin prior to concrete plans being made 3.  Would continue Foley catheter for now until further discussion was had with Dr. Vernie Ammonsttelin on 3/6  Thank you for this consult. Please contact the urology consult pager with any further questions/concerns. Jalene MulletMatthew Macey, MD Urology Surgical Resident   HPI -   Reason for Consult:  Urinary retention  Antonio Montoya is seen in consultation for reasons noted above at the request of Shon Haleourage Emokpae, MD.  This is a 66 yo patient with a history of mental retardation and schizophrenia.  He previously had acute urinary retention in November 2013 for which he was started on Flomax.  He was seen in the [facility] on February 09, 2016 with Dr. Vernie Ammonsttelin.  He was seen in the emergency department on February 20 again with acute urinary retention were a Foley catheter was placed with 700 output.  He is currently on  Flomax 0.8 dosing.  He will return to the emergency department on February 21 due to agitation with attempts to remove the Foley catheter and resultant gross hematuria.  He has been followed by psychiatry here for assistance with his agitation and medications.  That has been commented that his acute urinary retention may be associated with changes in his antipsychotic medication.  There was a thought that his Foley catheter was contributing to his agitation and therefore is removed on February 21 after 1 day of being in place.  His agitation did not improve after removal and was replaced on February 22.  It was then again removed on February 26 and replaced the same day on February 26.  He is unable to provide a history at all.  I attempted to contact his sister on the phone but was unable to do so.  I contacted the caregiver on file who was able to tell me that the patient had 1 previous acute urinary retention episode.  His creatinine is 1.1.  His urinalysis from February 20 on his initial visit was normal.   Past Medical History: Past Medical History:  Diagnosis Date  . MR (mental retardation)   . Schizophrenia [facility](HCC)     Past Surgical History:  History reviewed. No pertinent surgical history.  Medication: Current Facility-Administered Medications  Medication Dose Route Frequency Provider Last Rate Last Dose  . acetaminophen (TYLENOL) tablet 650 mg  650 mg Oral Q6H PRN Eduard ClosArshad N Kakrakandy, MD       Or  . acetaminophen (TYLENOL) suppository 650 mg  650 mg Rectal Q6H PRN Eduard ClosArshad N Kakrakandy, MD      .  asenapine (SAPHRIS) sublingual tablet 5 mg  5 mg Sublingual Q12H PRN Thedore Mins, MD   5 mg at 05/14/16 1859  . benztropine (COGENTIN) tablet 0.5 mg  0.5 mg Oral Daily Mojeed Akintayo, MD   0.5 mg at 05/14/16 1016  . carbamazepine (TEGRETOL) tablet 200 mg  200 mg Oral BID PC Mojeed Akintayo, MD   200 mg at 05/14/16 1738  . feeding supplement (ENSURE ENLIVE) (ENSURE ENLIVE) liquid 237 mL  237  mL Oral BID BM [name], MD   237 mL at 05/14/16 1016  . haloperidol (HALDOL) tablet 5 mg  5 mg Oral BID Thedore Mins, MD   5 mg at 05/14/16 1015  . lamoTRIgine (LAMICTAL) tablet 100 mg  100 mg Oral BID Eduard Clos, MD   100 mg at 05/14/16 1016  . LORazepam (ATIVAN) injection 1 mg  1 mg Intravenous Q6H PRN Calvert Cantor, MD   1 mg at 05/14/16 0427  . LORazepam (ATIVAN) tablet 0.5 mg  0.5 mg Oral BID Thedore Mins, MD   0.5 mg at 05/14/16 1015  . ondansetron (ZOFRAN) tablet 4 mg  4 mg Oral Q6H PRN Eduard Clos, MD       Or  . ondansetron [facility]) injection 4 mg  4 mg Intravenous Q6H PRN Eduard Clos, MD      . pantoprazole (PROTONIX) EC tablet 40 mg  40 mg Oral Daily Eduard Clos, MD   40 mg at 05/14/16 1015  . polycarbophil (FIBERCON) tablet 625 mg  625 mg Oral q morning - 10a Eduard Clos, MD   625 mg at 05/14/16 1016  . potassium chloride (K-DUR,KLOR-CON) CR tablet 10 mEq  10 mEq Oral q morning - 10a Eduard Clos, MD   10 mEq at 05/14/16 1015  . tamsulosin (FLOMAX) capsule 0.8 mg  0.8 mg Oral Daily Eduard Clos, MD   0.8 mg at 05/14/16 1015    Allergies: No Known Allergies  Social History: Social History  Substance Use Topics  . Smoking status: Never Smoker  . Smokeless tobacco: Never Used  . Alcohol use No    Family History Family History  Problem Relation Age of Onset  . Family history unknown: Yes    Review of Systems 10 systems were reviewed and are negative except as noted specifically in the HPI.  Objective   Vital signs in last 24 hours: BP (!) 150/54 (BP Location: Right Arm)   Pulse (!) 53   Temp (!) 94.2 F (34.6 C) (Rectal)   Resp 16   Ht 6' (1.829 m)   Wt 71 kg (156 lb 8.4 oz)   SpO2 100%   BMI 21.23 kg/m   Intake/Output last 3 shifts: I/O last 3 completed shifts: In: 2000 [P.O.:2000] Out: 3250 [Urine:3250]  Physical Exam General: NAD, agitated, nonverbal, in restraints HEENT: [place]/AT, EOMI,  MMM, often drools Pulmonary: Normal work of breathing on RA Cardiovascular: Regular rate & rhythm, HDS, adequate peripheral perfusion Abdomen: soft, NTTP, nondistended, no suprapubic fullness or tenderness GU: Foley catheter draining clear yellow urine DRE: not attempted due to patient mental condition and no chaperone present Extremities: warm and well perfused, no edema  Most Recent Labs: Lab Results  Component Value Date   WBC 7.5 05/09/2016   HGB 11.4 (L) 05/09/2016   HCT 32.3 (L) 05/09/2016   PLT 211 05/09/2016    Lab Results  Component Value Date   NA 144 05/08/2016   K 3.6 05/08/2016   CL  107 05/08/2016   CO2 32 05/08/2016   BUN 20 05/08/2016   CREATININE 1.12 05/08/2016   CALCIUM 9.7 05/08/2016   MG 2.4 05/02/2016    Lab Results  Component Value Date   ALKPHOS 113 05/08/2016   BILITOT 0.5 05/08/2016   PROT 6.6 05/08/2016   ALBUMIN 3.4 (L) 05/08/2016   ALT 43 05/08/2016   AST 45 (H) 05/08/2016    Lab Results  Component Value Date   INR 1.01 05/02/2016   APTT 30 05/02/2016     Urine Culture: None   IMAGING:  2/22 CT with contrast personally reviewed - bilateral sm renal cysts

## 2016-05-14 NOTE — Progress Notes (Signed)
Patient Demographics:    Antonio Montoya, is a 66 y.o. male, DOB - 1950-05-26, WUJ:[id number]  Admit date - 05/01/2016   Admitting Physician Eduard Clos, MD  Outpatient Primary MD for the patient is EGE,HILMI, MD (Inactive)  LOS - 12   Chief Complaint  Patient presents with  . Hematuria        Subjective:    Antonio Montoya today has no fevers, no emesis,  No chest pain,  REMAINS combative, disoriented and uncooperative   Assessment  & Plan :    Principal Problem:   Acute delirium Active Problems:   Pressure injury of skin   Hypothermia   Elevated LFTs   Urinary retention   Thrombocytopenia (HCC)   Brief Narrative: Antonio Montoya a 66 y.o.malewith DM diet controlled, mental retardation and schizophrenia had come to the ER 2/19 for urinary retention (bladder scan showed >1 L urine and caretaker stated he had not urinated in 18 hrs). He had Foley catheter placed and sent back to group home. Acute Exacerbation of underlying psych issues with newhypothermia &Urinary retention.  Patient is unable to give history- per ER HPI, he came if in for hematuria after pulling on his foley cath. He has beem increasingly agitated for a few wks per caregiver.Caregiver stated pt recently had a fit and broke several items including a glass ~ 3 days prior to admission &had swelling to the left hand on the day of admission .  In the ER patient had to be sedated with Ativan. He was found to have hypothermia with temp of 95.7.  This is the 5th visit to the ER since Jan of this year.  1/21- stumbled, fell, abrasion to forehead 2/4- left subconjunctival hemorrhage 2/16- off balance- head CT ok  2/16- U retention  Patient had coagulase-negative staph 1 out of 2 bottles deemed to be a contaminant  According to patient's sisters x 2 Francena Hanly and [place]) is his current behavior is baseline for him. It  is difficult to anticipate any improvement from a neuropsychiatric and  behavioral standpoint   Assessment & Plan:  1)Acute Exacerbation of underlying psych issues with newhypothermia - Hypothermia is resolved, his group home (caretaker) does not want him back Due to behavioral concerns. Psychiatrist adjusted his medications :- The following Medication Changes made thus far by psych:  decreased Haldol from 25 to 15 QHS and now Haldol 5 mg BID, Added 1mg  Ativan QHS now changed to 0.5 mg BID with 1mg  Q 6 PRN. Lamictal decreased from 125 in AM and 150 in PM to 100 BID. Risperdal ( which was given routine and PRN at home) Oxcarbazine stopped, Tegretol 200 mg BID added, Cogentin decreased from 1mg  daily to 0.5 mg daily, Saphris 5mg  Q12 PRN for agitation added, follow QTc. According to patient's sisters x 2 Francena Hanly and [place]) is his current behavior is baseline for him. It is difficult to anticipate any improvement from a neuropsychiatric and  behavioral standpoint  2)Urinary Retention- agitation did not improve after foley was removed, He failed voiding trial, foley cath have to be re-inserted due to retention. As per discharge planner it will  be difficult to place patient with a Foley catheter because with his agitation, he  is likely to pull out  the Foley cath. I spoke with Dr Annabell Howells (Urology) on 05/14/16, awaiting urology reevaluation. Continue Flomax 0.8 mg daily.   3)Hypothermia -no source of infection/sepsis found , off zosyn since 05/04/16, resolved hypothermia, transferred out of SDU on 05/09/16- As per psychiatrist and pt's sisters the patient has a traumatic brain injury which is causing his behavioral issues and there are no further medication changes he can make for him. per RNs, agitation has not resolved. He bites, punches and kicks when taken out of restraints. He has PRN Ativan. There was concern that psychiatric medications could be contributing to  hypothermia  4)Social/Ethics- d/w pt's sister Ms Jacelyn Grip at bedside and other sister Francena Hanly, apparently patient had traumatic brain injury as a child and has always had significant behavior problems, his parents couldn't take care of him and he has been a group home for long time. Antonio Montoya apparently took care of him for last 5 yrs. According to patient's sisters x 2 Francena Hanly and [place]) is his current behavior is baseline for him. It is difficult to anticipate any improvement from a neuropsychiatric and  behavioral standpoint  5)Disposition- For skilled nursing facility to take patient patient has to be off restraints on the 24 hours and hopefully with foley out  6)Swelling of left hand- - resolving, suspect this was mechanical injury , xray does not show fracture, initially suspected to be cellulitis in setting of hypothermia. Completed antibiotics, please see progress note from 05/13/2016.    7)Elevated LFTs/Thrombocytopenia- - CT abd shows no signs of infection- he has a mild CBD dilatation- doubt this is significant, suspect LFTs are elevated due to psych medications/ hypothermia . ThromboCytopenia has resolved  DVT prophylaxis:SCDs Code Status:Full code Family Communication: Disposition Plan:plan per psych- psych social worked working on disposition as his Group home will NOT take him back, will need to be out of restraints for 24 hours and PASARR before patient can be discharged to SNF as group home is unable to take patient with a foley catheter. According to patient's sisters x 2 Francena Hanly and [place]) is his current behavior is baseline for him. It is difficult to anticipate any improvement from a neuropsychiatric and  behavioral standpoint   Lab Results  Component Value Date   PLT 211 05/09/2016    Inpatient Medications  Scheduled Meds: . benztropine  0.5 mg Oral Daily  . carbamazepine  200 mg Oral BID PC  . feeding supplement (ENSURE ENLIVE)  237 mL Oral BID BM   . haloperidol  5 mg Oral BID  . lamoTRIgine  100 mg Oral BID  . LORazepam  0.5 mg Oral BID  . pantoprazole  40 mg Oral Daily  . polycarbophil  625 mg Oral q morning - 10a  . potassium chloride  10 mEq Oral q morning - 10a  . tamsulosin  0.8 mg Oral Daily   Continuous Infusions: PRN Meds:.acetaminophen **OR** acetaminophen, asenapine, LORazepam, ondansetron **OR** ondansetron (ZOFRAN) IV    Anti-infectives    Start     Dose/Rate Route Frequency Ordered Stop   05/02/16 2000  vancomycin (VANCOCIN) IVPB 750 mg/150 ml premix  Status:  Discontinued     750 mg 150 mL/hr over 60 Minutes Intravenous Every 12 hours 05/02/16 0643 05/03/16 0805   05/02/16 1400  piperacillin-tazobactam (ZOSYN) IVPB 3.375 g  Status:  Discontinued     3.375 g 12.5 mL/hr over 240 Minutes Intravenous Every 8 hours 05/02/16 0532 05/04/16 1125   05/02/16 0415  piperacillin-tazobactam (ZOSYN) IVPB 3.375 g  3.375 g 100 mL/hr over 30 Minutes Intravenous  Once 05/02/16 0407 05/02/16 0553   05/02/16 0415  vancomycin (VANCOCIN) IVPB 1000 mg/200 mL premix     1,000 mg 200 mL/hr over 60 Minutes Intravenous  Once 05/02/16 0407 05/02/16 0745   05/01/16 2200  clindamycin (CLEOCIN) IVPB 600 mg     600 mg 100 mL/hr over 30 Minutes Intravenous  Once 05/01/16 2148 05/02/16 0010        Objective:   Vitals:   05/13/16 1509 05/13/16 1521 05/14/16 0155 05/14/16 0646  BP: 136/74  (!) 157/91 (!) 144/84  Pulse: 68 72 68 64  Resp: 16  16 16   Temp:  97.1 F (36.2 C)    TempSrc: Other (Comment) Axillary    SpO2:  100% 100%   Weight:      Height:        Wt Readings from Last 3 Encounters:  05/02/16 71 kg (156 lb 8.4 oz)     Intake/Output Summary (Last 24 hours) at 05/14/16 1648 Last data filed at 05/14/16 1156  Gross per 24 hour  Intake              240 ml  Output             1775 ml  Net            -1535 ml   Physical Exam  Gen:- Awake ,  Not cooperative HEENT:- [place].AT, No sclera icterus Neck-Supple Neck,No  JVD,.  Lungs-  CTAB  CV- S1, S2 normal Abd-  +ve B.Sounds, Abd Soft, No tenderness,    Extremity/Skin:- No  edema,    Neuropsych- cognitive deficits, not cooperative, episodes of agitation, Gu- Foley cath with clear urine    Data Review:   Micro Results Recent Results (from the past 240 hour(s))  Culture, blood (routine x 2)     Status: None (Preliminary result)   Collection Time: 05/10/16 11:00 PM  Result Value Ref Range Status   Specimen Description BLOOD LEFT ARM  Final   Special Requests IN PEDIATRIC BOTTLE 1.5 CC  Final   Culture   Final    NO GROWTH 3 DAYS Performed at [facility], [address] [place], [place] [address]    Report Status PENDING  Incomplete  Culture, blood (routine x 2)     Status: Abnormal   Collection Time: 05/10/16 11:00 PM  Result Value Ref Range Status   Specimen Description BLOOD RIGHT FOREARM  Final   Special Requests IN PEDIATRIC BOTTLE 3 CC  Final   Culture  Setup Time   Final    GRAM POSITIVE COCCI IN CLUSTERS IN PEDIATRIC BOTTLE CRITICAL RESULT CALLED TO, READ BACK BY AND VERIFIED WITH: T. GREEN, PHARM AT [facility], 05/12/16 AT 1738 BY J FUDESCO    Culture (A)  Final    STAPHYLOCOCCUS SPECIES (COAGULASE NEGATIVE) THE SIGNIFICANCE OF ISOLATING THIS ORGANISM FROM A SINGLE SET OF BLOOD CULTURES WHEN MULTIPLE SETS ARE DRAWN IS UNCERTAIN. PLEASE NOTIFY THE MICROBIOLOGY DEPARTMENT WITHIN ONE WEEK IF SPECIATION AND SENSITIVITIES ARE REQUIRED. Performed at [facility], [address] [place], [place] [address]    Report Status 05/14/2016 FINAL  Final  Blood Culture ID Panel (Reflexed)     Status: None   Collection Time: 05/10/16 11:00 PM  Result Value Ref Range Status   Enterococcus species NOT DETECTED NOT DETECTED Final   Listeria monocytogenes NOT DETECTED NOT DETECTED Final   Staphylococcus species NOT DETECTED NOT DETECTED  Final   Staphylococcus aureus NOT DETECTED NOT DETECTED Final   Streptococcus species NOT DETECTED NOT  DETECTED Final   Streptococcus agalactiae NOT DETECTED NOT DETECTED Final   Streptococcus pneumoniae NOT DETECTED NOT DETECTED Final   Streptococcus pyogenes NOT DETECTED NOT DETECTED Final   Acinetobacter baumannii NOT DETECTED NOT DETECTED Final   Enterobacteriaceae species NOT DETECTED NOT DETECTED Final   Enterobacter cloacae complex NOT DETECTED NOT DETECTED Final   Escherichia coli NOT DETECTED NOT DETECTED Final   Klebsiella oxytoca NOT DETECTED NOT DETECTED Final   Klebsiella pneumoniae NOT DETECTED NOT DETECTED Final   Proteus species NOT DETECTED NOT DETECTED Final   Serratia marcescens NOT DETECTED NOT DETECTED Final   Haemophilus influenzae NOT DETECTED NOT DETECTED Final   Neisseria meningitidis NOT DETECTED NOT DETECTED Final   Pseudomonas aeruginosa NOT DETECTED NOT DETECTED Final   Candida albicans NOT DETECTED NOT DETECTED Final   Candida glabrata NOT DETECTED NOT DETECTED Final   Candida krusei NOT DETECTED NOT DETECTED Final   Candida parapsilosis NOT DETECTED NOT DETECTED Final   Candida tropicalis NOT DETECTED NOT DETECTED Final    Radiology Reports Ct Head Wo Contrast  Result Date: 05/01/2016 CLINICAL DATA:  Patient is agitated with delirium. History of schizophrenia and mental retardation. EXAM: CT HEAD WITHOUT CONTRAST TECHNIQUE: Contiguous axial images were obtained from the base of the skull through the vertex without intravenous contrast. COMPARISON:  04/27/2016. FINDINGS: Brain: No evidence of acute infarction, hemorrhage, hydrocephalus, extra-axial collection or mass lesion/mass effect. Vascular: No hyperdense vessel or unexpected calcification. Skull: Normal. Negative for fracture or focal lesion. Sinuses/Orbits: Globes orbits are unremarkable. Mild right maxillary sinus mucosal thickening. Sinuses otherwise essentially clear. Clear mastoid air cells. Other: None. IMPRESSION: 1. No acute intracranial abnormalities. No change from the prior head CT.  Electronically Signed   By: Amie Portland M.D.   On: 05/01/2016 19:55   Ct Head Wo Contrast  Result Date: 04/27/2016 CLINICAL DATA:  Initial evaluation for balance issues. EXAM: CT HEAD WITHOUT CONTRAST TECHNIQUE: Contiguous axial images were obtained from the base of the skull through the vertex without intravenous contrast. COMPARISON:  Prior CT 10/22/2003. FINDINGS: Brain: Study limited by patient positioning. Atrophy with mild chronic small vessel ischemic disease. No acute intracranial hemorrhage. No evidence for acute large vessel territory infarct. No mass lesion, midline shift or mass effect. No hydrocephalus. No extra-axial fluid collection. Vascular: No hyperdense vessel. Skull: Scalp soft tissues demonstrate no acute abnormality. Calvarium intact. Sinuses/Orbits: Globes and orbital soft tissues within normal limits. Paranasal sinuses and mastoid air cells are clear. IMPRESSION: 1. No acute intracranial process. 2. Generalized age-related cerebral atrophy with mild chronic small vessel ischemic disease. Electronically Signed   By: Rise Mu M.D.   On: 04/27/2016 22:36   Ct Abdomen W Contrast  Result Date: 05/03/2016 CLINICAL DATA:  Urinary retention and hematuria after pulling out a Foley catheter. EXAM: CT ABDOMEN WITH CONTRAST TECHNIQUE: Multidetector CT imaging of the abdomen was performed using the standard protocol following bolus administration of intravenous contrast. CONTRAST:  ISOVUE-300 IOPAMIDOL (ISOVUE-300) INJECTION 61% COMPARISON:  CT scan from 2012. FINDINGS: Lower chest: The lung bases demonstrate bilateral effusions and bibasilar infiltrates. The heart is enlarged but stable. There is tortuosity and mild ectasia of the thoracic aorta. Hepatobiliary: Diffuse fatty infiltration of the liver but no focal hepatic lesions. The gallbladder is grossly normal. Mild common bile duct dilatation without obvious cause. Maximum diameter is 8 mm. Recommend correlation with liver  function studies.  MRCP would not be an option for this patient. Pancreas: Grossly normal. Spleen: Grossly normal. Adrenals/Urinary Tract: The adrenal glands and kidneys are grossly normal. No hydronephrosis. Small renal cysts. Stomach/Bowel: Grossly normal.  Moderate stool in the colon. Vascular/Lymphatic: The aorta and branch vessels are patent. 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 Possible web or focal dissection in the left iliac artery but it is not completely covered. Other: No ascites. Musculoskeletal: No significant bony findings. IMPRESSION: Limited examination due to patient motion. Small bilateral pleural effusions and bibasilar infiltrates. Mild common bile duct and cystic duct dilatation without obvious cause. The common bile duct tapers normally to the duodenum. Recommend correlation with liver function studies. Patient would not be a candidate for MRCP. No acute abdominal findings, mass lesions or adenopathy. Electronically Signed   By: Rudie Meyer M.D.   On: 05/03/2016 15:17   Dg Chest Port 1 View  Result Date: 05/02/2016 CLINICAL DATA:  Sepsis EXAM: PORTABLE CHEST 1 VIEW COMPARISON:  Chest radiograph 06/17/2011 FINDINGS: Cardiac silhouette is enlarged mildly. There is no focal airspace consolidation or pulmonary edema. No pneumothorax or sizable pleural effusion. IMPRESSION: No focal airspace disease. Electronically Signed   By: Deatra Robinson M.D.   On: 05/02/2016 04:48   Dg Hand Complete Left  Result Date: 05/01/2016 CLINICAL DATA:  LT hand injury x 3 days ago with swelling on today. Caregiver states pt recently had a fit and broke several items including a glass. Pt has HX: Mental Retardation and Schizophrenia. Pt was combative prior to xray and was sedated in order to get xrays. 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 EXAM: LEFT HAND - COMPLETE 3+ VIEW COMPARISON:  None. FINDINGS: No fracture or  dislocation. 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 There is narrowing of the radiocarpal joint between the radius and scaphoid, with mild subchondral sclerosis. Soft tissue swelling is noted most evident dorsally. No soft tissue air or radiopaque foreign body. IMPRESSION: 1. No acute fracture.  No dislocation. 2. 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 3. Nonspecific soft tissue swelling most evident dorsally. Electronically Signed   By: Amie Portland M.D.   On: 05/01/2016 18:14     CBC  Recent Labs Lab 05/09/16 0457  WBC 7.5  HGB 11.4*  HCT 32.3*  PLT 211  MCV 91.8  MCH 32.4  MCHC 35.3  RDW 13.5    Chemistries   Recent Labs Lab 05/08/16 1617  NA 144  K 3.6  CL 107  CO2 32  GLUCOSE 71  BUN 20  CREATININE 1.12  CALCIUM 9.7  AST 45*  ALT 43  ALKPHOS 113  BILITOT 0.5   ------------------------------------------------------------------------------------------------------------------ No results for input(s): CHOL, HDL, LDLCALC, TRIG, CHOLHDL, LDLDIRECT in the last 72 hours.  No results found for: HGBA1C ------------------------------------------------------------------------------------------------------------------ No results for input(s): TSH, T4TOTAL, T3FREE, THYROIDAB in the last 72 hours.  Invalid input(s): FREET3 ------------------------------------------------------------------------------------------------------------------ No results for input(s): VITAMINB12, FOLATE, FERRITIN, TIBC, IRON, RETICCTPCT in the last 72 hours.  Coagulation profile No results for input(s): INR, PROTIME in the last 168 hours.  No results for input(s): DDIMER in the last 72 hours.  Cardiac Enzymes No results for input(s): CKMB, TROPONINI, MYOGLOBIN in the last 168 hours.  Invalid input(s):  CK ------------------------------------------------------------------------------------------------------------------ No results found for: BNP   Brissa Asante M.D on 05/14/2016 at 4:48 PM  Between 7am to 7pm -  Pager - [phone]  After 7pm go to [email or web address] - password [email or web address]  [facility] -  Office  [phone]  Dragon dictation system was used to create this note, attempts have been made to correct errors, however presence of uncorrected errors is not a reflection quality of care provided

## 2016-05-15 ENCOUNTER — Other Ambulatory Visit: Payer: Self-pay | Admitting: Urology

## 2016-05-15 LAB — URINALYSIS, MICROSCOPIC (REFLEX)

## 2016-05-15 LAB — URINALYSIS, ROUTINE W REFLEX MICROSCOPIC
Bilirubin Urine: NEGATIVE
Glucose, UA: NEGATIVE mg/dL
Ketones, ur: NEGATIVE mg/dL
Nitrite: NEGATIVE
Protein, ur: 30 mg/dL — AB
Specific Gravity, Urine: 1.01 (ref 1.005–1.030)
pH: 7.5 (ref 5.0–8.0)

## 2016-05-15 MED ORDER — LORAZEPAM 1 MG PO TABS
1.0000 mg | ORAL_TABLET | Freq: Three times a day (TID) | ORAL | Status: DC
  Administered 2016-05-15 – 2016-05-16 (×2): 1 mg via ORAL
  Filled 2016-05-15 (×2): qty 1

## 2016-05-15 NOTE — Progress Notes (Signed)
CSW continues to follow for discharge planning - will need to be out of restraints for 24 hours and PASARR before patient can be discharged to SNF as group home is unable to take patient with a foley catheter.   CSW reviewed chart indicating possible cytoscopy if patient's legal guardian/sister, Francena HanlyStella is agreeable.    Lincoln MaxinKelly Raylea Adcox, LCSW [facility] Clinical Social Worker cell #: [phone]

## 2016-05-15 NOTE — Progress Notes (Signed)
Assessment: Acute on chronic urinary retention - He has a Foley catheter indwelling now draining clear urine.  His creatinine has been normal.  He has been on alpha blockade therapy but is not taking a 5 alpha reductase inhibitor.  This would be the only other pharmacologic treatment for outlet obstruction that would be an option.  The problem is this medication takes anywhere from 3-6 months to see its full effect.  The other option would be cystoscopy under anesthesia with the plan to resect any obstructing prostate tissue in order to decrease outlet resistance as much as possible.  This will need to be discussed with his caregiver. If cystoscopy he is going to be performed a urinalysis and culture are necessary.  Plan: 1.  Continue Foley catheter. 2.  Discuss treatment options with caregiver. 3.  UA and urine culture.   Subjective: Patient agitated and restrained.   Objective: Vital signs in last 24 hours: Temp:  [94.2 F (34.6 C)-98 F (36.7 C)] (P) 98 F (36.7 C) (03/06 0654) Pulse Rate:  [53-93] (P) 67 (03/06 0654) Resp:  [16-18] (P) 18 (03/06 0654) BP: (111-150)/(54-90) (P) 141/70 (03/06 0654) SpO2:  [98 %-100 %] (P) 98 % (03/06 0654)A  Intake/Output from previous day: 03/05 0701 - 03/06 0700 In: 1140 [P.O.:1140] Out: 3575 [Urine:3575] Intake/Output this shift: No intake/output data recorded.  Past Medical History:  Diagnosis Date  . MR (mental retardation)   . Schizophrenia [facility](HCC)     Physical Exam:  Lungs - Normal respiratory effort, chest expands symmetrically.  Abdomen - Soft, non-tender & non-distended. GU - Foley catheter indwelling with mitts on to prevent dislodgment.  Lab Results: No results for input(s): WBC, HGB, HCT in the last 72 hours. BMET No results for input(s): NA, K, CL, CO2, GLUCOSE, BUN, CREATININE, CALCIUM in the last 72 hours. No results for input(s): LABURIN in the last 72 hours. Results for orders placed or performed during the hospital  encounter of 05/01/16  MRSA PCR Screening     Status: None   Collection Time: 05/02/16  3:08 AM  Result Value Ref Range Status   MRSA by PCR NEGATIVE NEGATIVE Final    Comment:        The GeneXpert MRSA Assay (FDA approved for NASAL specimens only), is one component of a comprehensive MRSA colonization surveillance program. It is not intended to diagnose MRSA infection nor to guide or monitor treatment for MRSA infections.   Culture, blood (x 2)     Status: None   Collection Time: 05/02/16  8:19 AM  Result Value Ref Range Status   Specimen Description BLOOD LEFT ARM  Final   Special Requests IN PEDIATRIC BOTTLE 1CC  Final   Culture   Final    NO GROWTH 5 DAYS Performed at [facility], [address] [place], [place] [address]    Report Status 05/07/2016 FINAL  Final  Culture, blood (x 2)     Status: None   Collection Time: 05/02/16  8:19 AM  Result Value Ref Range Status   Specimen Description BLOOD LEFT ARM  Final   Special Requests IN PEDIATRIC BOTTLE 2CC  Final   Culture   Final    NO GROWTH 5 DAYS Performed at [facility], [address] [place], [place] [address]    Report Status 05/07/2016 FINAL  Final  Culture, blood (routine x 2)     Status: None (Preliminary result)   Collection Time: 05/10/16 11:00 PM  Result Value  Ref Range Status   Specimen Description BLOOD LEFT ARM  Final   Special Requests IN PEDIATRIC BOTTLE 1.5 CC  Final   Culture   Final    NO GROWTH 3 DAYS Performed at [facility], [address], [place], [place] [address]    Report Status PENDING  Incomplete  Culture, blood (routine x 2)     Status: Abnormal   Collection Time: 05/10/16 11:00 PM  Result Value Ref Range Status   Specimen Description BLOOD RIGHT FOREARM  Final   Special Requests IN PEDIATRIC BOTTLE 3 CC  Final   Culture  Setup Time   Final    GRAM POSITIVE COCCI IN CLUSTERS IN PEDIATRIC BOTTLE CRITICAL RESULT CALLED TO, READ BACK BY AND VERIFIED WITH: T.  GREEN, PHARM AT [facility], 05/12/16 AT 1738 BY J FUDESCO    Culture (A)  Final    STAPHYLOCOCCUS SPECIES (COAGULASE NEGATIVE) THE SIGNIFICANCE OF ISOLATING THIS ORGANISM FROM A SINGLE SET OF BLOOD CULTURES WHEN MULTIPLE SETS ARE DRAWN IS UNCERTAIN. PLEASE NOTIFY THE MICROBIOLOGY DEPARTMENT WITHIN ONE WEEK IF SPECIATION AND SENSITIVITIES ARE REQUIRED. Performed at [facility], [address] [place], [place] [address]    Report Status 05/14/2016 FINAL  Final  Blood Culture ID Panel (Reflexed)     Status: None   Collection Time: 05/10/16 11:00 PM  Result Value Ref Range Status   Enterococcus species NOT DETECTED NOT DETECTED Final   Listeria monocytogenes NOT DETECTED NOT DETECTED Final   Staphylococcus species NOT DETECTED NOT DETECTED Final   Staphylococcus aureus NOT DETECTED NOT DETECTED Final   Streptococcus species NOT DETECTED NOT DETECTED Final   Streptococcus agalactiae NOT DETECTED NOT DETECTED Final   Streptococcus pneumoniae NOT DETECTED NOT DETECTED Final   Streptococcus pyogenes NOT DETECTED NOT DETECTED Final   Acinetobacter baumannii NOT DETECTED NOT DETECTED Final   Enterobacteriaceae species NOT DETECTED NOT DETECTED Final   Enterobacter cloacae complex NOT DETECTED NOT DETECTED Final   Escherichia coli NOT DETECTED NOT DETECTED Final   Klebsiella oxytoca NOT DETECTED NOT DETECTED Final   Klebsiella pneumoniae NOT DETECTED NOT DETECTED Final   Proteus species NOT DETECTED NOT DETECTED Final   Serratia marcescens NOT DETECTED NOT DETECTED Final   Haemophilus influenzae NOT DETECTED NOT DETECTED Final   Neisseria meningitidis NOT DETECTED NOT DETECTED Final   Pseudomonas aeruginosa NOT DETECTED NOT DETECTED Final   Candida albicans NOT DETECTED NOT DETECTED Final   Candida glabrata NOT DETECTED NOT DETECTED Final   Candida krusei NOT DETECTED NOT DETECTED Final   Candida parapsilosis NOT DETECTED NOT DETECTED Final   Candida tropicalis NOT DETECTED NOT DETECTED Final     Studies/Results: No results found.    Marrah Vanevery C 05/15/2016, 7:19 AM

## 2016-05-15 NOTE — Progress Notes (Signed)
PROGRESS NOTE    Antonio Montoya   UJW:[id number]  DOB: 1950/04/17  DOA: 05/01/2016 PCP: Shayne Alken, MD (Inactive)    Brief Narrative:  Antonio Montoya is a 66 y.o. male with DM diet controlled, mental retardation and schizophrenia had come to the ER 2/19 for urinary retention (bladder scan showed > 1 L urine and caretaker stated he had not urinated in 18 hrs). He had Foley catheter placed and sent back to group home.  Patient is unable to give history- per ER HPI, he came if in for hematuria after pulling on his foley cath. He has beem increasingly agitated for a few wks per caregiver. Caregiver stated pt recently had a fit and broke several items including a glass ~ 3 days prior to admission & had swelling to the left hand on the day of admission .  In the ER patient had to be sedated with Ativan. He was found to have hypothermia with temp of 95.7.  This is the 5th visit to the ER since Jan of this year.  1/21- stumbled, fell, abrasion to forehead 2/4- left subconjunctival hemorrhage 2/16- off balance- head CT ok  2/16- U retention  Subjective: Non-verbal-   Assessment & Plan:   Principal Problem:   Acute exacerbation of underlying psych issues with new hypothermia & Urinary retention  - his group home does not want him back -as the foley may be the cause of his increased agitation,  I asked RN to pull foley  - agitation did not improve- foley did have to be re-inserted due to retention and psych consulted for assistance - the following changed made thus far by psych:  decreased Haldol from 25 to 15 QHS and now Haldol 5 mg BID Added 1mg  Ativan QHS  now changed to 0.5 mg BID with 1mg  Q 6 PRN Lamictal decreased from 125 in AM and 150 in PM  to 100  BID Risperdal ( which was given routine and PRN at home) stopped-  Oxcarbazine stopped Tegretol 200 mg BID added  Cogentin decreased from 1mg  daily to 0.5 mg daily Saphris 5mg  Q12 PRN for agitation added - following QTc  -  hypothermia resolved,  transferred out of SDU   - 2/28 - spoke with Dr Magdalen Spatz with psych who states that the patient has a traumatic brain injury which is causing his behavioral issues and there are no further medication changes he can make for him -  per RNs, agitation has not resolved despite using the PRN medications ordered by psych. He bites, punches and kicks when taken out of restraints   - Will increase Ativan from 0.5 mg BID to 1 mgTID  Active Problems:  U retention/ AKI - initially presented to ER on 2/19- foley placed- started 0.8 mg of Flomax and sent back to Group home - voiding trial 2/21- we were hoping his agitation would resolve with removing foley as well but he failed trial and agitation did not improve - Second voiding trial given on 1/26- failed this one as well - as he has failed a voiding trial twice, I cannot remove catheter at this point- -Urology has evaluated him and recommend to continue foley, check Ua and U culture- decision will need to be made on whether to do a cystoscopy and resection of prostate tissues  Swelling of left hand - xray does not show fracture --initially suspected to be cellulitis in setting of hypothermia-  given Clinda  in ER and then zosyn & Vanc - Vanc stopped  2/21 and Zosyn continued- hand swelling not improving with Zosyn therefore I doubt it is cellulitis and is likely a traumatic injury-  (based on H and P) -  hand is non tender to touch - stopped Zosyn 2/23 - swelling has resolved after stopping Zosyn    Hypothermia - no source of sepsis found for this- blood cultures negative, UA negative - no endocrine cause found- cortisol and thyroid functions are normal - suspect poor thermoregulation due to antipsychotics- have spoken with psych about this-  Temps have improved  Elevated LFTs -  CT abd shows no signs of infection- he has a mild CBD dilatation- doubt this is significant - suspect LFTs are elevated due to psych medications/  hypothermia   Hypoglycemia - sugar 22 on 2/23- which was likely not accurate as he has had no other drops < 50- will stop checking  Bradycardia - present since admission - HR 50-60 when awake- drops to 30-40s when he sleeps- BP has been stable  Thrombocytopenia - noted on 2/16 and has progressed- medication related from Zosyn? - stopped Zosyn on 2/23- - following daily- improved to normal    Pressure injury of skin Pressure Injury 05/01/16 Stage II - left buttock- Partial thickness loss of dermis presenting as a shallow open ulcer with a red, pink wound bed without slough. irregular in size - white granulation tissue to area.     DVT prophylaxis: SCDs Code Status: Full code Family Communication:  Disposition Plan: plan per psych- psych social worked working on disposition as his Group home will not take him back Consultants:   psych Procedures:    Antimicrobials:  Anti-infectives    Start     Dose/Rate Route Frequency Ordered Stop   05/02/16 2000  vancomycin (VANCOCIN) IVPB 750 mg/150 ml premix  Status:  Discontinued     750 mg 150 mL/hr over 60 Minutes Intravenous Every 12 hours 05/02/16 0643 05/03/16 0805   05/02/16 1400  piperacillin-tazobactam (ZOSYN) IVPB 3.375 g  Status:  Discontinued     3.375 g 12.5 mL/hr over 240 Minutes Intravenous Every 8 hours 05/02/16 0532 05/04/16 1125   05/02/16 0415  piperacillin-tazobactam (ZOSYN) IVPB 3.375 g     3.375 g 100 mL/hr over 30 Minutes Intravenous  Once 05/02/16 0407 05/02/16 0553   05/02/16 0415  vancomycin (VANCOCIN) IVPB 1000 mg/200 mL premix     1,000 mg 200 mL/hr over 60 Minutes Intravenous  Once 05/02/16 0407 05/02/16 0745   05/01/16 2200  clindamycin (CLEOCIN) IVPB 600 mg     600 mg 100 mL/hr over 30 Minutes Intravenous  Once 05/01/16 2148 05/02/16 0010       Objective: Vitals:   05/14/16 0646 05/14/16 1650 05/15/16 0011 05/15/16 0654  BP: (!) 144/84 (!) 150/54 111/90 (!) 141/70  Pulse: 64 (!) 53 93 67  Resp:  16 16 18 18   Temp:  (!) 94.2 F (34.6 C) 97.5 F (36.4 C) 98 F (36.7 C)  TempSrc:  Rectal Axillary Axillary  SpO2:  100% 99% 98%  Weight:      Height:        Intake/Output Summary (Last 24 hours) at 05/15/16 1619 Last data filed at 05/15/16 0846  Gross per 24 hour  Intake              900 ml  Output             2925 ml  Net            -2025  ml   Filed Weights   05/02/16 0640  Weight: 71 kg (156 lb 8.4 oz)    Examination: General exam: alert, nonverbal, does not follow commands HEENT: PERRLA, oral mucosa moist, no sclera icterus or thrush Respiratory system: Clear to auscultation. Respiratory effort normal. Cardiovascular system: S1 & S2 heard, RRR.  No murmurs  Gastrointestinal system: Abdomen soft, non-tender, nondistended. Normal bowel sound. No organomegaly Central nervous system: moves all extremities Extremities: No cyanosis, clubbing- swelling of left hand resolved Skin: No rashes or ulcers Psychiatry:  Intermittently agitated requiring sedation    Data Reviewed: I have personally reviewed following labs and imaging studies  CBC:  Recent Labs Lab 05/09/16 0457  WBC 7.5  HGB 11.4*  HCT 32.3*  MCV 91.8  PLT 211   Basic Metabolic Panel: No results for input(s): NA, K, CL, CO2, GLUCOSE, BUN, CREATININE, CALCIUM, MG, PHOS in the last 168 hours. GFR: Estimated Creatinine Clearance: 66.9 mL/min (by C-G formula based on SCr of 1.12 mg/dL). Liver Function Tests: No results for input(s): AST, ALT, ALKPHOS, BILITOT, PROT, ALBUMIN in the last 168 hours. No results for input(s): LIPASE, AMYLASE in the last 168 hours. No results for input(s): AMMONIA in the last 168 hours. Coagulation Profile: No results for input(s): INR, PROTIME in the last 168 hours. Cardiac Enzymes: No results for input(s): CKTOTAL, CKMB, CKMBINDEX, TROPONINI in the last 168 hours. BNP (last 3 results) No results for input(s): PROBNP in the last 8760 hours. HbA1C: No results for input(s):  HGBA1C in the last 72 hours. CBG: No results for input(s): GLUCAP in the last 168 hours. Lipid Profile: No results for input(s): CHOL, HDL, LDLCALC, TRIG, CHOLHDL, LDLDIRECT in the last 72 hours. Thyroid Function Tests: No results for input(s): TSH, T4TOTAL, FREET4, T3FREE, THYROIDAB in the last 72 hours. Anemia Panel: No results for input(s): VITAMINB12, FOLATE, FERRITIN, TIBC, IRON, RETICCTPCT in the last 72 hours. Urine analysis:    Component Value Date/Time   COLORURINE YELLOW 05/15/2016 0939   APPEARANCEUR CLOUDY (A) 05/15/2016 0939   LABSPEC 1.010 05/15/2016 0939   PHURINE 7.5 05/15/2016 0939   GLUCOSEU NEGATIVE 05/15/2016 0939   HGBUR LARGE (A) 05/15/2016 0939   BILIRUBINUR NEGATIVE 05/15/2016 0939   KETONESUR NEGATIVE 05/15/2016 0939   PROTEINUR 30 (A) 05/15/2016 0939   UROBILINOGEN 1.0 01/16/2012 1957   NITRITE NEGATIVE 05/15/2016 0939   LEUKOCYTESUR TRACE (A) 05/15/2016 0939   Sepsis Labs: @LABRCNTIP (procalcitonin:4,lacticidven:4) ) Recent Results (from the past 240 hour(s))  Culture, blood (routine x 2)     Status: None (Preliminary result)   Collection Time: 05/10/16 11:00 PM  Result Value Ref Range Status   Specimen Description BLOOD LEFT ARM  Final   Special Requests IN PEDIATRIC BOTTLE 1.5 CC  Final   Culture   Final    NO GROWTH 4 DAYS Performed at [facility], [address], [place], [place] [address]    Report Status PENDING  Incomplete  Culture, blood (routine x 2)     Status: Abnormal   Collection Time: 05/10/16 11:00 PM  Result Value Ref Range Status   Specimen Description BLOOD RIGHT FOREARM  Final   Special Requests IN PEDIATRIC BOTTLE 3 CC  Final   Culture  Setup Time   Final    GRAM POSITIVE COCCI IN CLUSTERS IN PEDIATRIC BOTTLE CRITICAL RESULT CALLED TO, READ BACK BY AND VERIFIED WITH: T. GREEN, PHARM AT [facility], 05/12/16 AT 1738 BY J FUDESCO    Culture (A)  Final  STAPHYLOCOCCUS SPECIES (COAGULASE NEGATIVE) THE SIGNIFICANCE OF  ISOLATING THIS ORGANISM FROM A SINGLE SET OF BLOOD CULTURES WHEN MULTIPLE SETS ARE DRAWN IS UNCERTAIN. PLEASE NOTIFY THE MICROBIOLOGY DEPARTMENT WITHIN ONE WEEK IF SPECIATION AND SENSITIVITIES ARE REQUIRED. Performed at [facility], [address] [place], [place] [address]    Report Status 05/14/2016 FINAL  Final  Blood Culture ID Panel (Reflexed)     Status: None   Collection Time: 05/10/16 11:00 PM  Result Value Ref Range Status   Enterococcus species NOT DETECTED NOT DETECTED Final   Listeria monocytogenes NOT DETECTED NOT DETECTED Final   Staphylococcus species NOT DETECTED NOT DETECTED Final   Staphylococcus aureus NOT DETECTED NOT DETECTED Final   Streptococcus species NOT DETECTED NOT DETECTED Final   Streptococcus agalactiae NOT DETECTED NOT DETECTED Final   Streptococcus pneumoniae NOT DETECTED NOT DETECTED Final   Streptococcus pyogenes NOT DETECTED NOT DETECTED Final   Acinetobacter baumannii NOT DETECTED NOT DETECTED Final   Enterobacteriaceae species NOT DETECTED NOT DETECTED Final   Enterobacter cloacae complex NOT DETECTED NOT DETECTED Final   Escherichia coli NOT DETECTED NOT DETECTED Final   Klebsiella oxytoca NOT DETECTED NOT DETECTED Final   Klebsiella pneumoniae NOT DETECTED NOT DETECTED Final   Proteus species NOT DETECTED NOT DETECTED Final   Serratia marcescens NOT DETECTED NOT DETECTED Final   Haemophilus influenzae NOT DETECTED NOT DETECTED Final   Neisseria meningitidis NOT DETECTED NOT DETECTED Final   Pseudomonas aeruginosa NOT DETECTED NOT DETECTED Final   Candida albicans NOT DETECTED NOT DETECTED Final   Candida glabrata NOT DETECTED NOT DETECTED Final   Candida krusei NOT DETECTED NOT DETECTED Final   Candida parapsilosis NOT DETECTED NOT DETECTED Final   Candida tropicalis NOT DETECTED NOT DETECTED Final         Radiology Studies: No results found.    Scheduled Meds: . benztropine  0.5 mg Oral Daily  . carbamazepine  200 mg Oral  BID PC  . feeding supplement (ENSURE ENLIVE)  237 mL Oral BID BM  . haloperidol  5 mg Oral BID  . lamoTRIgine  100 mg Oral BID  . LORazepam  0.5 mg Oral BID  . pantoprazole  40 mg Oral Daily  . polycarbophil  625 mg Oral q morning - 10a  . potassium chloride  10 mEq Oral q morning - 10a  . tamsulosin  0.8 mg Oral Daily   Continuous Infusions:    LOS: 13 days    Time spent in minutes: 35    Antonio Gretzinger, MD [facility] Pager: [email or web address] Password [email or web address] 05/15/2016, 4:19 PM

## 2016-05-16 LAB — BASIC METABOLIC PANEL
ANION GAP: 6 (ref 5–15)
BUN: 19 mg/dL (ref 6–20)
CO2: 29 mmol/L (ref 22–32)
Calcium: 9.5 mg/dL (ref 8.9–10.3)
Chloride: 106 mmol/L (ref 101–111)
Creatinine, Ser: 1.05 mg/dL (ref 0.61–1.24)
GFR calc Af Amer: >60 mL/min (ref >60)
GLUCOSE: 80 mg/dL (ref 65–99)
POTASSIUM: 3.6 mmol/L (ref 3.5–5.1)
Sodium: 141 mmol/L (ref 135–145)

## 2016-05-16 LAB — CULTURE, BLOOD (ROUTINE X 2): Culture: NO GROWTH

## 2016-05-16 LAB — CBC
HEMATOCRIT: 29.7 % — AB (ref 39.0–52.0)
HEMOGLOBIN: 10.4 g/dL — AB (ref 13.0–17.0)
MCH: 32.6 pg (ref 26.0–34.0)
MCHC: 35 g/dL (ref 30.0–36.0)
MCV: 93.1 fL (ref 78.0–100.0)
Platelets: 234 10*3/uL (ref 150–400)
RBC: 3.19 MIL/uL — ABNORMAL LOW (ref 4.22–5.81)
RDW: 14.7 % (ref 11.5–15.5)
WBC: 5.3 10*3/uL (ref 4.0–10.5)

## 2016-05-16 LAB — URINE CULTURE
Culture: NO GROWTH
Special Requests: NORMAL

## 2016-05-16 MED ORDER — DEXTROSE 5 % IV SOLN
1.0000 g | INTRAVENOUS | Status: DC
  Administered 2016-05-16 – 2016-05-18 (×3): 1 g via INTRAVENOUS
  Filled 2016-05-16 (×3): qty 10

## 2016-05-16 MED ORDER — DEXTROSE 5 % IV SOLN
1.0000 g | Freq: Two times a day (BID) | INTRAVENOUS | Status: DC
  Filled 2016-05-16: qty 10

## 2016-05-16 MED ORDER — LORAZEPAM 2 MG/ML IJ SOLN
1.0000 mg | Freq: Once | INTRAMUSCULAR | Status: AC
  Administered 2016-05-16: 1 mg via INTRAVENOUS

## 2016-05-16 MED ORDER — HALOPERIDOL 5 MG PO TABS
10.0000 mg | ORAL_TABLET | Freq: Three times a day (TID) | ORAL | Status: DC
  Administered 2016-05-16 – 2016-05-17 (×3): 10 mg via ORAL
  Filled 2016-05-16 (×4): qty 2

## 2016-05-16 NOTE — Progress Notes (Signed)
Assessment: Acute on chronic urinary retention - I spoke with his sister yesterday who is his power of attorney.  We discussed the fact that he has required a Foley catheter and clearly does not tolerate a Foley catheter and therefore would not tolerate a suprapubic tube either.  These are not viable long-term options.  In a 66 year old male there is a good chance that his retention could be in part due to outlet obstruction due to BPH.  This is not something that can be evaluated with bedside cystoscopy to make that determination and therefore what I have recommended is evaluation of his outlet with cystoscopy under anesthesia.  At that time I will determine the degree of obstruction from his prostate and either proceed with a transurethral incision of the prostate or a TURP based on my findings at that time.  I told her that this will be the best that I could do to affect better emptying of his bladder and eliminate the need for Foley catheter.  We discussed the procedure in detail including its risks and complications, the alternatives, the anticipated postoperative course and the probability of success.  He does not need tamsulosin currently with a catheter in place and therefore this medication will be stopped.  Bacteriuria - A urinalysis revealed numerous red cells but also many bacteria and was nitrite negative however in preparation for upcoming surgery I have cultured his urine and have begun empiric antibiotic coverage.  Plan: 1.  Stop tamsulosin 2.  Continue Foley catheter for now. 3.  Begin empiric Rocephin in preparation for surgery. 4.  Cystoscopy and possible TURP on Friday.   Subjective: Patient is nonverbal  Objective: Vital signs in last 24 hours: Temp:  [97.4 F (36.3 C)-98 F (36.7 C)] 97.4 F (36.3 C) (03/06 2037) Pulse Rate:  [63-67] 63 (03/06 2037) Resp:  [18] 18 (03/06 2037) BP: (97-141)/(54-70) 97/54 (03/06 2037) SpO2:  [98 %-100 %] 100 % (03/06  1700)  Intake/Output from previous day: 03/06 0701 - 03/07 0700 In: 240 [P.O.:240] Out: 675 [Urine:675] Intake/Output this shift: No intake/output data recorded.  Past Medical History:  Diagnosis Date  . MR (mental retardation)   . Schizophrenia (HCC)    Current Facility-Administered Medications  Medication Dose Route Frequency Provider Last Rate Last Dose  . acetaminophen (TYLENOL) tablet 650 mg  650 mg Oral Q6H PRN Eduard Clos, MD       Or  . acetaminophen (TYLENOL) suppository 650 mg  650 mg Rectal Q6H PRN Eduard Clos, MD      . asenapine (SAPHRIS) sublingual tablet 5 mg  5 mg Sublingual Q12H PRN Thedore Mins, MD   5 mg at 05/16/16 0129  . benztropine (COGENTIN) tablet 0.5 mg  0.5 mg Oral Daily Mojeed Akintayo, MD   0.5 mg at 05/15/16 0905  . carbamazepine (TEGRETOL) tablet 200 mg  200 mg Oral BID PC Mojeed Akintayo, MD   200 mg at 05/15/16 1731  . cefTRIAXone (ROCEPHIN) 1 g in dextrose 5 % 50 mL IVPB  1 g Intravenous Q12H Ihor Gully, MD      . feeding supplement (ENSURE ENLIVE) (ENSURE ENLIVE) liquid 237 mL  237 mL Oral BID BM Calvert Cantor, MD   237 mL at 05/15/16 0906  . haloperidol (HALDOL) tablet 5 mg  5 mg Oral BID Thedore Mins, MD   5 mg at 05/15/16 2137  . lamoTRIgine (LAMICTAL) tablet 100 mg  100 mg Oral BID Eduard Clos, MD   100 mg  at 05/15/16 2136  . LORazepam (ATIVAN) injection 1 mg  1 mg Intravenous Q6H PRN Calvert Cantor, MD   1 mg at 05/16/16 0051  . LORazepam (ATIVAN) tablet 1 mg  1 mg Oral TID Calvert Cantor, MD   1 mg at 05/15/16 2138  . ondansetron (ZOFRAN) tablet 4 mg  4 mg Oral Q6H PRN Eduard Clos, MD       Or  . ondansetron [facility]) injection 4 mg  4 mg Intravenous Q6H PRN Eduard Clos, MD      . pantoprazole (PROTONIX) EC tablet 40 mg  40 mg Oral Daily Eduard Clos, MD   40 mg at 05/15/16 0905  . polycarbophil (FIBERCON) tablet 625 mg  625 mg Oral q morning - 10a Eduard Clos, MD   625 mg at 05/15/16  0905  . potassium chloride (K-DUR,KLOR-CON) CR tablet 10 mEq  10 mEq Oral q morning - 10a Eduard Clos, MD   10 mEq at 05/15/16 1610    Physical Exam:  General: Patient is in no apparent distress Lungs: Normal respiratory effort, chest expands symmetrically. GI: The abdomen is soft and nontender without mass. GU: Foley catheter indwelling with clear urine.   Lab Results: No results for input(s): WBC, HGB, HCT in the last 72 hours. BMET No results for input(s): NA, K, CL, CO2, GLUCOSE, BUN, CREATININE, CALCIUM in the last 72 hours. No results for input(s): LABPT, INR in the last 72 hours. No results for input(s): LABURIN in the last 72 hours. Results for orders placed or performed during the hospital encounter of 05/01/16  MRSA PCR Screening     Status: None   Collection Time: 05/02/16  3:08 AM  Result Value Ref Range Status   MRSA by PCR NEGATIVE NEGATIVE Final    Comment:        The GeneXpert MRSA Assay (FDA approved for NASAL specimens only), is one component of a comprehensive MRSA colonization surveillance program. It is not intended to diagnose MRSA infection nor to guide or monitor treatment for MRSA infections.   Culture, blood (x 2)     Status: None   Collection Time: 05/02/16  8:19 AM  Result Value Ref Range Status   Specimen Description BLOOD LEFT ARM  Final   Special Requests IN PEDIATRIC BOTTLE 1CC  Final   Culture   Final    NO GROWTH 5 DAYS Performed at [facility], [address] [place], [place] [address]    Report Status 05/07/2016 FINAL  Final  Culture, blood (x 2)     Status: None   Collection Time: 05/02/16  8:19 AM  Result Value Ref Range Status   Specimen Description BLOOD LEFT ARM  Final   Special Requests IN PEDIATRIC BOTTLE 2CC  Final   Culture   Final    NO GROWTH 5 DAYS Performed at [facility], [address] [place], [place] [address]    Report Status 05/07/2016 FINAL  Final  Culture, blood (routine x 2)      Status: None (Preliminary result)   Collection Time: 05/10/16 11:00 PM  Result Value Ref Range Status   Specimen Description BLOOD LEFT ARM  Final   Special Requests IN PEDIATRIC BOTTLE 1.5 CC  Final   Culture   Final    NO GROWTH 4 DAYS Performed at [facility], [address], [place], [place] [address]    Report Status PENDING  Incomplete  Culture, blood (routine  x 2)     Status: Abnormal   Collection Time: 05/10/16 11:00 PM  Result Value Ref Range Status   Specimen Description BLOOD RIGHT FOREARM  Final   Special Requests IN PEDIATRIC BOTTLE 3 CC  Final   Culture  Setup Time   Final    GRAM POSITIVE COCCI IN CLUSTERS IN PEDIATRIC BOTTLE CRITICAL RESULT CALLED TO, READ BACK BY AND VERIFIED WITH: T. GREEN, PHARM AT [facility], 05/12/16 AT 1738 BY J FUDESCO    Culture (A)  Final    STAPHYLOCOCCUS SPECIES (COAGULASE NEGATIVE) THE SIGNIFICANCE OF ISOLATING THIS ORGANISM FROM A SINGLE SET OF BLOOD CULTURES WHEN MULTIPLE SETS ARE DRAWN IS UNCERTAIN. PLEASE NOTIFY THE MICROBIOLOGY DEPARTMENT WITHIN ONE WEEK IF SPECIATION AND SENSITIVITIES ARE REQUIRED. Performed at [facility], [address] [place], [place] [id number]    Report Status 05/14/2016 FINAL  Final  Blood Culture ID Panel (Reflexed)     Status: None   Collection Time: 05/10/16 11:00 PM  Result Value Ref Range Status   Enterococcus species NOT DETECTED NOT DETECTED Final   Listeria monocytogenes NOT DETECTED NOT DETECTED Final   Staphylococcus species NOT DETECTED NOT DETECTED Final   Staphylococcus aureus NOT DETECTED NOT DETECTED Final   Streptococcus species NOT DETECTED NOT DETECTED Final   Streptococcus agalactiae NOT DETECTED NOT DETECTED Final   Streptococcus pneumoniae NOT DETECTED NOT DETECTED Final   Streptococcus pyogenes NOT DETECTED NOT DETECTED Final   Acinetobacter baumannii NOT DETECTED NOT DETECTED Final   Enterobacteriaceae species NOT DETECTED NOT DETECTED Final   Enterobacter cloacae complex NOT  DETECTED NOT DETECTED Final   Escherichia coli NOT DETECTED NOT DETECTED Final   Klebsiella oxytoca NOT DETECTED NOT DETECTED Final   Klebsiella pneumoniae NOT DETECTED NOT DETECTED Final   Proteus species NOT DETECTED NOT DETECTED Final   Serratia marcescens NOT DETECTED NOT DETECTED Final   Haemophilus influenzae NOT DETECTED NOT DETECTED Final   Neisseria meningitidis NOT DETECTED NOT DETECTED Final   Pseudomonas aeruginosa NOT DETECTED NOT DETECTED Final   Candida albicans NOT DETECTED NOT DETECTED Final   Candida glabrata NOT DETECTED NOT DETECTED Final   Candida krusei NOT DETECTED NOT DETECTED Final   Candida parapsilosis NOT DETECTED NOT DETECTED Final   Candida tropicalis NOT DETECTED NOT DETECTED Final    Studies/Results: No results found.     Annie Saephan C 05/16/2016, 4:12 AM

## 2016-05-16 NOTE — Progress Notes (Signed)
Patient jerking bilateral arms causing wrist restraints to rub against skin and cause some brusing. Jerking right arm so strongly that it broke the wrist restraint, new restraint applied properly and will monitor closely.

## 2016-05-16 NOTE — Procedures (Signed)
Pt very agitated and is in all four restraints included waist. Pt is putting himself in awkward positions trying to get himself loose despite reorientation and redirection. Restraint pads replaced as well as hand mittens.  Sitter at bedside. Will continue to monitor closely.

## 2016-05-16 NOTE — Progress Notes (Addendum)
PROGRESS NOTE    Antonio Montoya   WJX:[id number]  DOB: [birth year]  DOA: 05/01/2016 PCP: Shayne Alken, MD (Inactive)    Brief Narrative:  Antonio Montoya is a 66 y.o. male with DM diet controlled, mental retardation and schizophrenia had come to the ER 2/19 for urinary retention (bladder scan showed > 1 L urine and caretaker stated he had not urinated in 18 hrs). He had Foley catheter placed and sent back to group home.  Patient is unable to give history- per ER HPI, he came if in for hematuria after pulling on his foley cath. He has beem increasingly agitated for a few wks per caregiver. Caregiver stated pt recently had a fit and broke several items including a glass ~ 3 days prior to admission & had swelling to the left hand on the day of admission .  In the ER patient had to be sedated with Ativan. He was found to have hypothermia with temp of 95.7.  This is the 5th visit to the ER since Jan of this year.  1/21- stumbled, fell, abrasion to forehead 2/4- left subconjunctival hemorrhage 2/16- off balance- head CT ok  2/16- U retention  Subjective: Non-verbal-   Assessment & Plan:   Principal Problem:   Acute exacerbation of underlying psych issues with new hypothermia & Urinary retention  - his group home does not want him back -as the foley may be the cause of his increased agitation,  I asked RN to pull foley  - agitation did not improve- foley did have to be re-inserted due to retention and psych consulted for assistance - the following changed made thus far by psych:  decreased Haldol from 25 to 15 QHS and now Haldol 5 mg BID Added 1mg  Ativan QHS  now changed to 0.5 mg BID with 1mg  Q 6 PRN Lamictal decreased from 125 in AM and 150 in PM  to 100  BID Risperdal ( which was given routine and PRN at home) stopped-  Oxcarbazine stopped Tegretol 200 mg BID added  Cogentin decreased from 1mg  daily to 0.5 mg daily Saphris 5mg  Q12 PRN for agitation added  - hypothermia resolved,   transferred out of SDU   -last seen by psych on 2/26- on 2/28, I spoke with Dr Magdalen Spatz who states that the patient has a traumatic brain injury which is causing his behavioral issues and there are no further medication changes he can make for him -  per RNs, agitation has not resolved despite using the PRN medications ordered by psych. He bites, punches and kicks when taken out of restraints   - 3/8 -  increased Ativan from 0.5 mg BID to 1 mgTID- still very agitated, pulled right arm out of restraint this AM - will d/c Ativan in case it is having a paradoxial effect and increase haldol while watching QTc  Active Problems:  U retention/ AKI - initially presented to ER on 2/19- foley placed- started 0.8 mg of Flomax and sent back to Group home - voiding trial 2/21- we were hoping his agitation would resolve with removing foley as well but he failed trial and agitation did not improve - Second voiding trial given on 1/26- failed this one as well - as he has failed a voiding trial twice, I cannot remove catheter at this point- -Urology has evaluated him and recommend to continue foley, check Ua and U culture- decision will need to be made on whether to do a cystoscopy and resection of prostate tissues - UA + (  has foley) and U culture pending- Ceftriaxone started yesterday by Urology  Swelling of left hand - xray does not show fracture --initially suspected to be cellulitis in setting of hypothermia-  given Clinda  in ER and then zosyn & Vanc - Vanc stopped 2/21 and Zosyn continued- hand swelling not improving with Zosyn therefore I doubt it is cellulitis and is likely a traumatic injury-  (based on H and P) -  hand is non tender to touch - stopped Zosyn 2/23 - swelling has resolved after stopping Zosyn    Hypothermia - no source of sepsis found for this- blood cultures negative, UA negative - no endocrine cause found- cortisol and thyroid functions are normal - suspect poor thermoregulation  due to antipsychotics- have spoken with psych about this-  Temps have improved  Elevated LFTs -  CT abd shows no signs of infection- he has a mild CBD dilatation- doubt this is significant - suspect LFTs are elevated due to psych medications/ hypothermia   Hypoglycemia - sugar 22 on 2/23- which was likely not accurate as he has had no other drops < 50- will stop checking  Bradycardia - present since admission - HR 50-60 when awake- drops to 30-40s when he sleeps- BP has been stable  Thrombocytopenia - noted on 2/16 and has progressed- medication related from Zosyn? - stopped Zosyn on 2/23- - following daily- improved to normal    Pressure injury of skin Pressure Injury 05/01/16 Stage II - left buttock- Partial thickness loss of dermis presenting as a shallow open ulcer with a red, pink wound bed without slough. irregular in size - white granulation tissue to area.     DVT prophylaxis: SCDs Code Status: Full code Family Communication:  Disposition Plan: plan per psych- psych social worked working on disposition as his Group home will not take him back Consultants:   psych Procedures:    Antimicrobials:  Anti-infectives    Start     Dose/Rate Route Frequency Ordered Stop   05/16/16 0600  cefTRIAXone (ROCEPHIN) 1 g in dextrose 5 % 50 mL IVPB  Status:  Discontinued     1 g 100 mL/hr over 30 Minutes Intravenous Every 12 hours 05/16/16 0412 05/16/16 0443   05/16/16 0600  cefTRIAXone (ROCEPHIN) 1 g in dextrose 5 % 50 mL IVPB     1 g 100 mL/hr over 30 Minutes Intravenous Every 24 hours 05/16/16 0443     05/02/16 2000  vancomycin (VANCOCIN) IVPB 750 mg/150 ml premix  Status:  Discontinued     750 mg 150 mL/hr over 60 Minutes Intravenous Every 12 hours 05/02/16 0643 05/03/16 0805   05/02/16 1400  piperacillin-tazobactam (ZOSYN) IVPB 3.375 g  Status:  Discontinued     3.375 g 12.5 mL/hr over 240 Minutes Intravenous Every 8 hours 05/02/16 0532 05/04/16 1125   05/02/16 0415   piperacillin-tazobactam (ZOSYN) IVPB 3.375 g     3.375 g 100 mL/hr over 30 Minutes Intravenous  Once 05/02/16 0407 05/02/16 0553   05/02/16 0415  vancomycin (VANCOCIN) IVPB 1000 mg/200 mL premix     1,000 mg 200 mL/hr over 60 Minutes Intravenous  Once 05/02/16 0407 05/02/16 0745   05/01/16 2200  clindamycin (CLEOCIN) IVPB 600 mg     600 mg 100 mL/hr over 30 Minutes Intravenous  Once 05/01/16 2148 05/02/16 0010       Objective: Vitals:   05/15/16 0011 05/15/16 0654 05/15/16 1700 05/15/16 2037  BP: 111/90 (!) 141/70 111/60 (!) 97/54  Pulse: 93 67  63 63  Resp: 18 18 18 18   Temp: 97.5 F (36.4 C) 98 F (36.7 C)  97.4 F (36.3 C)  TempSrc: Axillary Axillary  Axillary  SpO2: 99% 98% 100%   Weight:      Height:        Intake/Output Summary (Last 24 hours) at 05/16/16 1326 Last data filed at 05/16/16 1054  Gross per 24 hour  Intake              580 ml  Output             1550 ml  Net             -970 ml   Filed Weights   05/02/16 0640  Weight: 71 kg (156 lb 8.4 oz)    Examination: General exam: alert, nonverbal, does not follow commands HEENT: PERRLA, oral mucosa moist, no sclera icterus or thrush Respiratory system: Clear to auscultation. Respiratory effort normal. Cardiovascular system: S1 & S2 heard, RRR.  No murmurs  Gastrointestinal system: Abdomen soft, non-tender, nondistended. Normal bowel sound. No organomegaly Central nervous system: moves all extremities Extremities: No cyanosis, clubbing- swelling of left hand resolved Skin: No rashes or ulcers Psychiatry:  Intermittently agitated requiring sedation    Data Reviewed: I have personally reviewed following labs and imaging studies  CBC:  Recent Labs Lab 05/16/16 0604  WBC 5.3  HGB 10.4*  HCT 29.7*  MCV 93.1  PLT 234   Basic Metabolic Panel:  Recent Labs Lab 05/16/16 0604  NA 141  K 3.6  CL 106  CO2 29  GLUCOSE 80  BUN 19  CREATININE 1.05  CALCIUM 9.5   GFR: Estimated Creatinine  Clearance: 71.4 mL/min (by C-G formula based on SCr of 1.05 mg/dL). Liver Function Tests: No results for input(s): AST, ALT, ALKPHOS, BILITOT, PROT, ALBUMIN in the last 168 hours. No results for input(s): LIPASE, AMYLASE in the last 168 hours. No results for input(s): AMMONIA in the last 168 hours. Coagulation Profile: No results for input(s): INR, PROTIME in the last 168 hours. Cardiac Enzymes: No results for input(s): CKTOTAL, CKMB, CKMBINDEX, TROPONINI in the last 168 hours. BNP (last 3 results) No results for input(s): PROBNP in the last 8760 hours. HbA1C: No results for input(s): HGBA1C in the last 72 hours. CBG: No results for input(s): GLUCAP in the last 168 hours. Lipid Profile: No results for input(s): CHOL, HDL, LDLCALC, TRIG, CHOLHDL, LDLDIRECT in the last 72 hours. Thyroid Function Tests: No results for input(s): TSH, T4TOTAL, FREET4, T3FREE, THYROIDAB in the last 72 hours. Anemia Panel: No results for input(s): VITAMINB12, FOLATE, FERRITIN, TIBC, IRON, RETICCTPCT in the last 72 hours. Urine analysis:    Component Value Date/Time   COLORURINE YELLOW 05/15/2016 0939   APPEARANCEUR CLOUDY (A) 05/15/2016 0939   LABSPEC 1.010 05/15/2016 0939   PHURINE 7.5 05/15/2016 0939   GLUCOSEU NEGATIVE 05/15/2016 0939   HGBUR LARGE (A) 05/15/2016 0939   BILIRUBINUR NEGATIVE 05/15/2016 0939   KETONESUR NEGATIVE 05/15/2016 0939   PROTEINUR 30 (A) 05/15/2016 0939   UROBILINOGEN 1.0 01/16/2012 1957   NITRITE NEGATIVE 05/15/2016 0939   LEUKOCYTESUR TRACE (A) 05/15/2016 0939   Sepsis Labs: @LABRCNTIP (procalcitonin:4,lacticidven:4) ) Recent Results (from the past 240 hour(s))  Culture, blood (routine x 2)     Status: None   Collection Time: 05/10/16 11:00 PM  Result Value Ref Range Status   Specimen Description BLOOD LEFT ARM  Final   Special Requests IN PEDIATRIC BOTTLE 1.5 CC  Final  Culture   Final    NO GROWTH 5 DAYS Performed at [facility], [address],  [place], [place] [address]    Report Status 05/16/2016 FINAL  Final  Culture, blood (routine x 2)     Status: Abnormal   Collection Time: 05/10/16 11:00 PM  Result Value Ref Range Status   Specimen Description BLOOD RIGHT FOREARM  Final   Special Requests IN PEDIATRIC BOTTLE 3 CC  Final   Culture  Setup Time   Final    GRAM POSITIVE COCCI IN CLUSTERS IN PEDIATRIC BOTTLE CRITICAL RESULT CALLED TO, READ BACK BY AND VERIFIED WITH: T. GREEN, PHARM AT [facility], 05/12/16 AT 1738 BY J FUDESCO    Culture (A)  Final    STAPHYLOCOCCUS SPECIES (COAGULASE NEGATIVE) THE SIGNIFICANCE OF ISOLATING THIS ORGANISM FROM A SINGLE SET OF BLOOD CULTURES WHEN MULTIPLE SETS ARE DRAWN IS UNCERTAIN. PLEASE NOTIFY THE MICROBIOLOGY DEPARTMENT WITHIN ONE WEEK IF SPECIATION AND SENSITIVITIES ARE REQUIRED. Performed at [facility], [address] [place], [place] [address]    Report Status 05/14/2016 FINAL  Final  Blood Culture ID Panel (Reflexed)     Status: None   Collection Time: 05/10/16 11:00 PM  Result Value Ref Range Status   Enterococcus species NOT DETECTED NOT DETECTED Final   Listeria monocytogenes NOT DETECTED NOT DETECTED Final   Staphylococcus species NOT DETECTED NOT DETECTED Final   Staphylococcus aureus NOT DETECTED NOT DETECTED Final   Streptococcus species NOT DETECTED NOT DETECTED Final   Streptococcus agalactiae NOT DETECTED NOT DETECTED Final   Streptococcus pneumoniae NOT DETECTED NOT DETECTED Final   Streptococcus pyogenes NOT DETECTED NOT DETECTED Final   Acinetobacter baumannii NOT DETECTED NOT DETECTED Final   Enterobacteriaceae species NOT DETECTED NOT DETECTED Final   Enterobacter cloacae complex NOT DETECTED NOT DETECTED Final   Escherichia coli NOT DETECTED NOT DETECTED Final   Klebsiella oxytoca NOT DETECTED NOT DETECTED Final   Klebsiella pneumoniae NOT DETECTED NOT DETECTED Final   Proteus species NOT DETECTED NOT DETECTED Final   Serratia marcescens NOT DETECTED NOT DETECTED Final    Haemophilus influenzae NOT DETECTED NOT DETECTED Final   Neisseria meningitidis NOT DETECTED NOT DETECTED Final   Pseudomonas aeruginosa NOT DETECTED NOT DETECTED Final   Candida albicans NOT DETECTED NOT DETECTED Final   Candida glabrata NOT DETECTED NOT DETECTED Final   Candida krusei NOT DETECTED NOT DETECTED Final   Candida parapsilosis NOT DETECTED NOT DETECTED Final   Candida tropicalis NOT DETECTED NOT DETECTED Final  Urine culture     Status: None   Collection Time: 05/15/16  9:39 AM  Result Value Ref Range Status   Specimen Description URINE, CATHETERIZED  Final   Special Requests Normal  Final   Culture   Final    NO GROWTH Performed at [facility], [address] [place], [place] [address]    Report Status 05/16/2016 FINAL  Final         Radiology Studies: No results found.    Scheduled Meds: . benztropine  0.5 mg Oral Daily  . carbamazepine  200 mg Oral BID PC  . cefTRIAXone (ROCEPHIN)  IV  1 g Intravenous Q24H  . feeding supplement (ENSURE ENLIVE)  237 mL Oral BID BM  . haloperidol  10 mg Oral TID  . lamoTRIgine  100 mg Oral BID  . pantoprazole  40 mg Oral Daily  . polycarbophil  625 mg Oral q morning - 10a  . potassium chloride  10 mEq Oral q morning - 10a   Continuous Infusions:    LOS: 14 days    Time spent in minutes: 35    Ryelan Kazee, MD [facility] Pager: [email or web address] Password [email or web address] 05/16/2016, 1:26 PM

## 2016-05-16 NOTE — Progress Notes (Signed)
PHARMACY NOTE:  ANTIMICROBIAL DOSAGE ADJUSTMENT  Current antimicrobial regimen includes a mismatch between antimicrobial dosage and indication.  As per policy approved by the Pharmacy & Therapeutics and Medical Executive Committees, the antimicrobial dosage will be adjusted accordingly.  Current antimicrobial dosage:  Rocephin 1 Gm IV q12h  Indication: UTI  Renal Function: 66 ml/min  Antimicrobial dosage has been changed to:  Rocephin 1 Gm IV q24h  Thank you for allowing pharmacy to be a part of this patient's care.  Lorenza EvangelistGreen, Refael Fulop R, [facility] 05/16/2016 4:20 AM

## 2016-05-17 MED ORDER — HALOPERIDOL 5 MG PO TABS
5.0000 mg | ORAL_TABLET | Freq: Two times a day (BID) | ORAL | Status: DC
  Administered 2016-05-17 – 2016-05-24 (×13): 5 mg via ORAL
  Filled 2016-05-17 (×15): qty 1

## 2016-05-17 NOTE — Progress Notes (Signed)
Anselm Lisnoch, from group home came by to visit today and stated this is close to patient baseline. The only concern they have is the foley catheter. Social work-please stay in contact with group home after foley removal and patient is voiding. They may take him back from what Anselm Lisnoch was stating today.

## 2016-05-17 NOTE — Progress Notes (Signed)
PROGRESS NOTE    Jaran Sainz   ZOX:[id number]  DOB: 1950-04-11  DOA: 05/01/2016 PCP: Shayne Alken, MD (Inactive)    Brief Narrative:  Antonio Montoya is a 66 y.o. male with DM diet controlled, mental retardation and schizophrenia had come to the ER 2/19 for urinary retention (bladder scan showed > 1 L urine and caretaker stated he had not urinated in 18 hrs). He had Foley catheter placed and sent back to group home.  Patient is unable to give history- per ER HPI, he came if in for hematuria after pulling on his foley cath. He has beem increasingly agitated for a few wks per caregiver. Caregiver stated pt recently had a fit and broke several items including a glass ~ 3 days prior to admission & had swelling to the left hand on the day of admission .  In the ER patient had to be sedated with Ativan. He was found to have hypothermia with temp of 95.7.  This is the 5th visit to the ER since Jan of this year.  1/21- stumbled, fell, abrasion to forehead 2/4- left subconjunctival hemorrhage 2/16- off balance- head CT ok  2/16- U retention  Subjective: Non-verbal-   Assessment & Plan:   Principal Problem:   Acute exacerbation of underlying psych issues with new hypothermia & Urinary retention  - his group home does not want him back -as the foley may be the cause of his increased agitation,  I asked RN to pull foley  - agitation did not improve- foley did have to be re-inserted due to retention and psych consulted for assistance - the following changed made thus far by psych:  decreased Haldol from 25 to 15 QHS and now Haldol 5 mg BID Added 1mg  Ativan QHS  now changed to 0.5 mg BID with 1mg  Q 6 PRN Lamictal decreased from 125 in AM and 150 in PM  to 100  BID Risperdal ( which was given routine and PRN at home) stopped-  Oxcarbazine stopped Tegretol 200 mg BID added  Cogentin decreased from 1mg  daily to 0.5 mg daily Saphris 5mg  Q12 PRN for agitation added  - hypothermia resolved,   transferred out of SDU   -last seen by psych on 2/26- on 2/28, I spoke with Dr Magdalen Spatz who states that the patient has a traumatic brain injury which is causing his behavioral issues and there are no further medication changes he can make for him -  per RNs, agitation has not resolved despite using the PRN medications ordered by psych. He bites, punches and kicks when taken out of restraints   - 3/8 -  increased Ativan from 0.5 mg BID to 1 mgTID- still very agitated, pulled right arm out of restraint this AM - will d/c Ativan in case it is having a paradoxial effect and increase haldol while watching QTc - temp dropped to 94.1 today- stop Haldol - follow temps  Active Problems:  U retention/ AKI - initially presented to ER on 2/19- foley placed- started 0.8 mg of Flomax and sent back to Group home - voiding trial 2/21- we were hoping his agitation would resolve with removing foley as well but he failed trial and agitation did not improve - Second voiding trial given on 1/26- failed this one as well - as he has failed a voiding trial twice, I cannot remove catheter at this point- -Urology has evaluated him and recommend to continue foley, check Ua and U culture- decision will need to be made on whether  to do a cystoscopy and resection of prostate tissues - UA + (has foley) and U culture pending- Ceftriaxone started  by Urology- plan for cystoscopy and possible TURP tomorrow  Swelling of left hand - xray does not show fracture --initially suspected to be cellulitis in setting of hypothermia-  given Clinda  in ER and then zosyn & Vanc - Vanc stopped 2/21 and Zosyn continued- hand swelling not improving with Zosyn therefore I doubt it is cellulitis and is likely a traumatic injury-  (based on H and P) -  hand is non tender to touch - stopped Zosyn 2/23 - swelling has resolved after stopping Zosyn    Hypothermia - no source of sepsis found for this- blood cultures negative, UA negative - no  endocrine cause found- cortisol and thyroid functions are normal - suspect poor thermoregulation due to antipsychotics- have spoken with psych about this-  Temps have improved  Elevated LFTs -  CT abd shows no signs of infection- he has a mild CBD dilatation- doubt this is significant - suspect LFTs are elevated due to psych medications/ hypothermia   Hypoglycemia - sugar 22 on 2/23- which was likely not accurate as he has had no other drops < 50- will stop checking  Bradycardia - present since admission - HR 50-60 when awake- drops to 30-40s when he sleeps- BP has been stable  Thrombocytopenia - noted on 2/16 and has progressed- medication related from Zosyn? - stopped Zosyn on 2/23- - following daily- improved to normal    Pressure injury of skin Pressure Injury 05/01/16 Stage II - left buttock- Partial thickness loss of dermis presenting as a shallow open ulcer with a red, pink wound bed without slough. irregular in size - white granulation tissue to area.     DVT prophylaxis: SCDs Code Status: Full code Family Communication:  Disposition Plan: plan per psych- psych social worked working on disposition as his Group home will not take him back Consultants:   psych Procedures:    Antimicrobials:  Anti-infectives    Start     Dose/Rate Route Frequency Ordered Stop   05/16/16 0600  cefTRIAXone (ROCEPHIN) 1 g in dextrose 5 % 50 mL IVPB  Status:  Discontinued     1 g 100 mL/hr over 30 Minutes Intravenous Every 12 hours 05/16/16 0412 05/16/16 0443   05/16/16 0600  cefTRIAXone (ROCEPHIN) 1 g in dextrose 5 % 50 mL IVPB     1 g 100 mL/hr over 30 Minutes Intravenous Every 24 hours 05/16/16 0443     05/02/16 2000  vancomycin (VANCOCIN) IVPB 750 mg/150 ml premix  Status:  Discontinued     750 mg 150 mL/hr over 60 Minutes Intravenous Every 12 hours 05/02/16 0643 05/03/16 0805   05/02/16 1400  piperacillin-tazobactam (ZOSYN) IVPB 3.375 g  Status:  Discontinued     3.375 g 12.5 mL/hr  over 240 Minutes Intravenous Every 8 hours 05/02/16 0532 05/04/16 1125   05/02/16 0415  piperacillin-tazobactam (ZOSYN) IVPB 3.375 g     3.375 g 100 mL/hr over 30 Minutes Intravenous  Once 05/02/16 0407 05/02/16 0553   05/02/16 0415  vancomycin (VANCOCIN) IVPB 1000 mg/200 mL premix     1,000 mg 200 mL/hr over 60 Minutes Intravenous  Once 05/02/16 0407 05/02/16 0745   05/01/16 2200  clindamycin (CLEOCIN) IVPB 600 mg     600 mg 100 mL/hr over 30 Minutes Intravenous  Once 05/01/16 2148 05/02/16 0010       Objective: Vitals:   05/15/16  1700 05/15/16 2037 05/16/16 2137 05/17/16 1326  BP: 111/60 (!) 97/54 (!) 143/79 (!) 149/109  Pulse: 63 63 77 88  Resp: 18 18 20 20   Temp:  97.4 F (36.3 C)  (!) 94.1 F (34.5 C)  TempSrc:  Axillary  Axillary  SpO2: 100%  100% 97%  Weight:      Height:        Intake/Output Summary (Last 24 hours) at 05/17/16 1358 Last data filed at 05/17/16 1002  Gross per 24 hour  Intake              340 ml  Output              850 ml  Net             -510 ml   Filed Weights   05/02/16 0640  Weight: 71 kg (156 lb 8.4 oz)    Examination: General exam: alert, nonverbal, does not follow commands HEENT: PERRLA, oral mucosa moist, no sclera icterus or thrush Respiratory system: Clear to auscultation. Respiratory effort normal. Cardiovascular system: S1 & S2 heard, RRR.  No murmurs  Gastrointestinal system: Abdomen soft, non-tender, nondistended. Normal bowel sound. No organomegaly Central nervous system: moves all extremities Extremities: No cyanosis, clubbing- swelling of left hand resolved Skin: No rashes or ulcers Psychiatry:  Intermittently agitated requiring sedation    Data Reviewed: I have personally reviewed following labs and imaging studies  CBC:  Recent Labs Lab 05/16/16 0604  WBC 5.3  HGB 10.4*  HCT 29.7*  MCV 93.1  PLT 234   Basic Metabolic Panel:  Recent Labs Lab 05/16/16 0604  NA 141  K 3.6  CL 106  CO2 29  GLUCOSE 80    BUN 19  CREATININE 1.05  CALCIUM 9.5   GFR: Estimated Creatinine Clearance: 71.4 mL/min (by C-G formula based on SCr of 1.05 mg/dL). Liver Function Tests: No results for input(s): AST, ALT, ALKPHOS, BILITOT, PROT, ALBUMIN in the last 168 hours. No results for input(s): LIPASE, AMYLASE in the last 168 hours. No results for input(s): AMMONIA in the last 168 hours. Coagulation Profile: No results for input(s): INR, PROTIME in the last 168 hours. Cardiac Enzymes: No results for input(s): CKTOTAL, CKMB, CKMBINDEX, TROPONINI in the last 168 hours. BNP (last 3 results) No results for input(s): PROBNP in the last 8760 hours. HbA1C: No results for input(s): HGBA1C in the last 72 hours. CBG: No results for input(s): GLUCAP in the last 168 hours. Lipid Profile: No results for input(s): CHOL, HDL, LDLCALC, TRIG, CHOLHDL, LDLDIRECT in the last 72 hours. Thyroid Function Tests: No results for input(s): TSH, T4TOTAL, FREET4, T3FREE, THYROIDAB in the last 72 hours. Anemia Panel: No results for input(s): VITAMINB12, FOLATE, FERRITIN, TIBC, IRON, RETICCTPCT in the last 72 hours. Urine analysis:    Component Value Date/Time   COLORURINE YELLOW 05/15/2016 0939   APPEARANCEUR CLOUDY (A) 05/15/2016 0939   LABSPEC 1.010 05/15/2016 0939   PHURINE 7.5 05/15/2016 0939   GLUCOSEU NEGATIVE 05/15/2016 0939   HGBUR LARGE (A) 05/15/2016 0939   BILIRUBINUR NEGATIVE 05/15/2016 0939   KETONESUR NEGATIVE 05/15/2016 0939   PROTEINUR 30 (A) 05/15/2016 0939   UROBILINOGEN 1.0 01/16/2012 1957   NITRITE NEGATIVE 05/15/2016 0939   LEUKOCYTESUR TRACE (A) 05/15/2016 0939   Sepsis Labs: @LABRCNTIP (procalcitonin:4,lacticidven:4) ) Recent Results (from the past 240 hour(s))  Culture, blood (routine x 2)     Status: None   Collection Time: 05/10/16 11:00 PM  Result Value Ref Range Status  Specimen Description BLOOD LEFT ARM  Final   Special Requests IN PEDIATRIC BOTTLE 1.5 CC  Final   Culture   Final    NO  GROWTH 5 DAYS Performed at [facility], [address], [place], [place] [address]    Report Status 05/16/2016 FINAL  Final  Culture, blood (routine x 2)     Status: Abnormal   Collection Time: 05/10/16 11:00 PM  Result Value Ref Range Status   Specimen Description BLOOD RIGHT FOREARM  Final   Special Requests IN PEDIATRIC BOTTLE 3 CC  Final   Culture  Setup Time   Final    GRAM POSITIVE COCCI IN CLUSTERS IN PEDIATRIC BOTTLE CRITICAL RESULT CALLED TO, READ BACK BY AND VERIFIED WITH: T. GREEN, PHARM AT [facility], 05/12/16 AT 1738 BY J FUDESCO    Culture (A)  Final    STAPHYLOCOCCUS SPECIES (COAGULASE NEGATIVE) THE SIGNIFICANCE OF ISOLATING THIS ORGANISM FROM A SINGLE SET OF BLOOD CULTURES WHEN MULTIPLE SETS ARE DRAWN IS UNCERTAIN. PLEASE NOTIFY THE MICROBIOLOGY DEPARTMENT WITHIN ONE WEEK IF SPECIATION AND SENSITIVITIES ARE REQUIRED. Performed at [facility], [address] [place], [place] [address]    Report Status 05/14/2016 FINAL  Final  Blood Culture ID Panel (Reflexed)     Status: None   Collection Time: 05/10/16 11:00 PM  Result Value Ref Range Status   Enterococcus species NOT DETECTED NOT DETECTED Final   Listeria monocytogenes NOT DETECTED NOT DETECTED Final   Staphylococcus species NOT DETECTED NOT DETECTED Final   Staphylococcus aureus NOT DETECTED NOT DETECTED Final   Streptococcus species NOT DETECTED NOT DETECTED Final   Streptococcus agalactiae NOT DETECTED NOT DETECTED Final   Streptococcus pneumoniae NOT DETECTED NOT DETECTED Final   Streptococcus pyogenes NOT DETECTED NOT DETECTED Final   Acinetobacter baumannii NOT DETECTED NOT DETECTED Final   Enterobacteriaceae species NOT DETECTED NOT DETECTED Final   Enterobacter cloacae complex NOT DETECTED NOT DETECTED Final   Escherichia coli NOT DETECTED NOT DETECTED Final   Klebsiella oxytoca NOT DETECTED NOT DETECTED Final   Klebsiella pneumoniae NOT DETECTED NOT DETECTED Final   Proteus species NOT DETECTED NOT  DETECTED Final   Serratia marcescens NOT DETECTED NOT DETECTED Final   Haemophilus influenzae NOT DETECTED NOT DETECTED Final   Neisseria meningitidis NOT DETECTED NOT DETECTED Final   Pseudomonas aeruginosa NOT DETECTED NOT DETECTED Final   Candida albicans NOT DETECTED NOT DETECTED Final   Candida glabrata NOT DETECTED NOT DETECTED Final   Candida krusei NOT DETECTED NOT DETECTED Final   Candida parapsilosis NOT DETECTED NOT DETECTED Final   Candida tropicalis NOT DETECTED NOT DETECTED Final  Urine culture     Status: None   Collection Time: 05/15/16  9:39 AM  Result Value Ref Range Status   Specimen Description URINE, CATHETERIZED  Final   Special Requests Normal  Final   Culture   Final    NO GROWTH Performed at [facility], [address] [place], [place] [address]    Report Status 05/16/2016 FINAL  Final         Radiology Studies: No results found.    Scheduled Meds: . benztropine  0.5 mg Oral Daily  . carbamazepine  200 mg Oral BID PC  . cefTRIAXone (ROCEPHIN)  IV  1 g Intravenous Q24H  . feeding supplement (ENSURE ENLIVE)  237 mL Oral BID BM  . haloperidol  10 mg Oral TID  . lamoTRIgine  100 mg Oral BID  . pantoprazole  40 mg Oral Daily  . polycarbophil  625 mg Oral q morning - 10a  . potassium chloride  10 mEq Oral q morning - 10a   Continuous Infusions:    LOS: 15 days    Time spent in minutes: 35    Corah Willeford, MD [facility] Pager: [email or web address] Password [email or web address] 05/17/2016, 1:58 PM

## 2016-05-17 NOTE — Progress Notes (Signed)
PT Cancellation Note / Screen  Patient Details Name: Antonio PiggsCharlie Ewart MRN: [id number] DOB: [birth year]   Cancelled Treatment:    Reason Eval/Treat Not Completed: Medical issues which prohibited therapy;PT screened, no needs identified, will sign off.  Pt remains agitated/combative with nursing, also still in restraints.  Please reorder once pt is appropriate and/or restraints are able to be safely removed and he is able to participate.   Doxie Augenstein,KATHrine E 05/17/2016, 9:47 AM Zenovia JarredKati Jazmine Longshore, PT, DPT 05/17/2016 Pager: [phone]

## 2016-05-17 NOTE — Progress Notes (Addendum)
Pt climbing out of bed despite restraints and ;yelling into hallway. Not consolable.  He is not following commands or eating much. Pt is also banging his head against side rails.

## 2016-05-18 ENCOUNTER — Encounter (HOSPITAL_COMMUNITY): Payer: Self-pay | Admitting: Urology

## 2016-05-18 ENCOUNTER — Encounter (HOSPITAL_COMMUNITY)
Admission: EM | Disposition: A | Discharge status: Home / Self Care | Payer: Self-pay | Source: Home / Self Care | Attending: Internal Medicine

## 2016-05-18 ENCOUNTER — Inpatient Hospital Stay (HOSPITAL_COMMUNITY): Payer: Medicare Other | Admitting: Anesthesiology

## 2016-05-18 HISTORY — PX: CYSTOSCOPY: SHX5120

## 2016-05-18 HISTORY — PX: TRANSURETHRAL RESECTION OF PROSTATE: SHX73

## 2016-05-18 SURGERY — TURP (TRANSURETHRAL RESECTION OF PROSTATE)
Anesthesia: General

## 2016-05-18 MED ORDER — PHENAZOPYRIDINE HCL 200 MG PO TABS: 200.0000 mg | ORAL_TABLET | Freq: Three times a day (TID) | ORAL | 0 refills | Status: DC | PRN

## 2016-05-18 MED ORDER — FENTANYL CITRATE (PF) 100 MCG/2ML IJ SOLN: 25.0000 ug | INTRAMUSCULAR | Status: DC | PRN

## 2016-05-18 MED ORDER — MIDAZOLAM HCL 2 MG/2ML IJ SOLN
INTRAMUSCULAR | Status: AC
  Filled 2016-05-18: qty 2

## 2016-05-18 MED ORDER — EPHEDRINE 5 MG/ML INJ
INTRAVENOUS | Status: AC
  Filled 2016-05-18: qty 10

## 2016-05-18 MED ORDER — CIPROFLOXACIN IN D5W 400 MG/200ML IV SOLN
INTRAVENOUS | Status: AC
  Filled 2016-05-18: qty 200

## 2016-05-18 MED ORDER — LORAZEPAM 2 MG/ML IJ SOLN
1.0000 mg | INTRAMUSCULAR | Status: DC | PRN
  Administered 2016-05-18 – 2016-05-22 (×5): 1 mg via INTRAVENOUS
  Filled 2016-05-18 (×5): qty 1

## 2016-05-18 MED ORDER — FENTANYL CITRATE (PF) 100 MCG/2ML IJ SOLN
INTRAMUSCULAR | Status: DC | PRN
  Administered 2016-05-18: 50 ug via INTRAVENOUS

## 2016-05-18 MED ORDER — HYDROCODONE-ACETAMINOPHEN 10-325 MG PO TABS: 1.0000 | ORAL_TABLET | ORAL | 0 refills | Status: DC | PRN

## 2016-05-18 MED ORDER — LIDOCAINE 2% (20 MG/ML) 5 ML SYRINGE
INTRAMUSCULAR | Status: DC | PRN
  Administered 2016-05-18: 100 mg via INTRAVENOUS

## 2016-05-18 MED ORDER — ONDANSETRON HCL 4 MG/2ML IJ SOLN
INTRAMUSCULAR | Status: DC | PRN
  Administered 2016-05-18: 4 mg via INTRAVENOUS

## 2016-05-18 MED ORDER — LORAZEPAM 2 MG/ML IJ SOLN: 1.0000 mg | INTRAMUSCULAR | Status: DC | PRN

## 2016-05-18 MED ORDER — PROPOFOL 10 MG/ML IV BOLUS
INTRAVENOUS | Status: AC
  Filled 2016-05-18: qty 20

## 2016-05-18 MED ORDER — LORAZEPAM 2 MG/ML IJ SOLN: 0.5000 mg | INTRAMUSCULAR | Status: DC | PRN

## 2016-05-18 MED ORDER — FENTANYL CITRATE (PF) 100 MCG/2ML IJ SOLN
INTRAMUSCULAR | Status: AC
  Filled 2016-05-18: qty 2

## 2016-05-18 MED ORDER — CIPROFLOXACIN IN D5W 400 MG/200ML IV SOLN
400.0000 mg | INTRAVENOUS | Status: AC
  Administered 2016-05-18: 400 mg via INTRAVENOUS

## 2016-05-18 MED ORDER — MIDAZOLAM HCL 5 MG/5ML IJ SOLN
INTRAMUSCULAR | Status: DC | PRN
  Administered 2016-05-18 (×2): 1 mg via INTRAVENOUS

## 2016-05-18 MED ORDER — LIDOCAINE 2% (20 MG/ML) 5 ML SYRINGE
INTRAMUSCULAR | Status: AC
  Filled 2016-05-18: qty 5

## 2016-05-18 MED ORDER — ONDANSETRON HCL 4 MG/2ML IJ SOLN: 4.0000 mg | Freq: Once | INTRAMUSCULAR | Status: DC | PRN

## 2016-05-18 MED ORDER — PROPOFOL 10 MG/ML IV BOLUS
INTRAVENOUS | Status: DC | PRN
  Administered 2016-05-18: 30 mg via INTRAVENOUS
  Administered 2016-05-18: 40 mg via INTRAVENOUS

## 2016-05-18 MED ORDER — SODIUM CHLORIDE 0.9 % IR SOLN
Status: DC | PRN
  Administered 2016-05-18: 12000 mL via INTRAVESICAL

## 2016-05-18 MED ORDER — EPHEDRINE SULFATE-NACL 50-0.9 MG/10ML-% IV SOSY
PREFILLED_SYRINGE | INTRAVENOUS | Status: DC | PRN
  Administered 2016-05-18: 15 mg via INTRAVENOUS
  Administered 2016-05-18: 10 mg via INTRAVENOUS
  Administered 2016-05-18: 25 mg via INTRAVENOUS

## 2016-05-18 MED ORDER — LACTATED RINGERS IV SOLN
INTRAVENOUS | Status: DC | PRN
  Administered 2016-05-18: 12:00:00 via INTRAVENOUS

## 2016-05-18 SURGICAL SUPPLY — 27 items
BAG URINE DRAINAGE (UROLOGICAL SUPPLIES) ×3 IMPLANT
BAG URO CATCHER STRL LF (MISCELLANEOUS) ×3 IMPLANT
BLADE SURG 15 STRL LF DISP TIS (BLADE) IMPLANT
BLADE SURG 15 STRL SS (BLADE)
CATH FOLEY 3WAY 30CC 20FR (CATHETERS) ×3 IMPLANT
CATH FOLEY 3WAY 30CC 24FR (CATHETERS) ×2
CATH INTERMIT  6FR 70CM (CATHETERS) IMPLANT
CATH URTH STD 24FR FL 3W 2 (CATHETERS) ×1 IMPLANT
CLOTH BEACON ORANGE TIMEOUT ST (SAFETY) ×3 IMPLANT
ELECT REM PT RETURN 9FT ADLT (ELECTROSURGICAL) ×3
ELECTRODE REM PT RTRN 9FT ADLT (ELECTROSURGICAL) ×1 IMPLANT
EVACUATOR MICROVAS BLADDER (UROLOGICAL SUPPLIES) ×3 IMPLANT
GLOVE BIOGEL M 8.0 STRL (GLOVE) ×3 IMPLANT
GOWN STRL REUS W/ TWL XL LVL3 (GOWN DISPOSABLE) IMPLANT
GOWN STRL REUS W/TWL XL LVL3 (GOWN DISPOSABLE) ×3 IMPLANT
GUIDEWIRE STR DUAL SENSOR (WIRE) IMPLANT
LOOP CUT BIPOLAR 24F LRG (ELECTROSURGICAL) IMPLANT
MANIFOLD NEPTUNE II (INSTRUMENTS) ×3 IMPLANT
NS IRRIG 1000ML POUR BTL (IV SOLUTION) ×3 IMPLANT
PACK CYSTO (CUSTOM PROCEDURE TRAY) ×3 IMPLANT
SET ASPIRATION TUBING (TUBING) IMPLANT
SUT ETHILON 3 0 PS 1 (SUTURE) IMPLANT
SYR 30ML LL (SYRINGE) ×3 IMPLANT
TUBING CONNECTING 10 (TUBING) ×2 IMPLANT
TUBING CONNECTING 10' (TUBING) ×1
WATER STERILE IRR 500ML POUR (IV SOLUTION) ×3 IMPLANT
WIRE COONS/BENSON .038X145CM (WIRE) IMPLANT

## 2016-05-18 NOTE — Anesthesia Postprocedure Evaluation (Signed)
Anesthesia Post Note  Patient: Antonio Montoya  Procedure(s) Performed: Procedure(s) (LRB): TRANSURETHRAL RESECTION OF THE PROSTATE (TURP) (N/A) CYSTOSCOPY (N/A)  Patient location during evaluation: PACU Anesthesia Type: General Level of consciousness: awake (neuro status at pre-op baseline) Pain management: pain level controlled Vital Signs Assessment: post-procedure vital signs reviewed and stable Respiratory status: spontaneous breathing, nonlabored ventilation, respiratory function stable and patient connected to nasal cannula oxygen Cardiovascular status: blood pressure returned to baseline and stable Postop Assessment: no signs of nausea or vomiting Anesthetic complications: no       Last Vitals:  Vitals:   05/18/16 1422 05/18/16 1448  BP: (!) 93/47 (!) 95/39  Pulse:  (!) 55  Resp:    Temp: (!) 35.9 C     Last Pain:  Vitals:   05/18/16 1345  TempSrc:   PainSc: Asleep                 Cecile HearingStephen Edward Turk

## 2016-05-18 NOTE — Interval H&P Note (Signed)
My evaluation of his current situation as well as assessment and recommendations are noted in the progress notes that I have placed in the patient's chart previously. He is brought to the operating room today for planned cystoscopy under anesthesia with outlet resistance reduction surgery based on my intraoperative findings.

## 2016-05-18 NOTE — Anesthesia Preprocedure Evaluation (Addendum)
Anesthesia Evaluation  Patient identified by MRN, date of birth, ID band Patient confused    Reviewed: Allergy & Precautions, NPO status , Patient's Chart, lab work & pertinent test results  Airway Mallampati: II  TM Distance: >3 FB Neck ROM: Full    Dental  (+) Dental Advisory Given, Edentulous Upper, Edentulous Lower   Pulmonary neg pulmonary ROS,    Pulmonary exam normal breath sounds clear to auscultation       Cardiovascular Exercise Tolerance: Good negative cardio ROS Normal cardiovascular exam Rhythm:Regular Rate:Normal     Neuro/Psych PSYCHIATRIC DISORDERS Schizophrenia Mental retardation    GI/Hepatic Neg liver ROS, GERD  Medicated,  Endo/Other  negative endocrine ROS  Renal/GU negative Renal ROS     Musculoskeletal negative musculoskeletal ROS (+)   Abdominal   Peds  Hematology  (+) Blood dyscrasia, anemia ,   Anesthesia Other Findings   Reproductive/Obstetrics                            Anesthesia Physical Anesthesia Plan  ASA: II  Anesthesia Plan: General   Post-op Pain Management:    Induction: Intravenous  Airway Management Planned: LMA  Additional Equipment:   Intra-op Plan:   Post-operative Plan: Extubation in OR  Informed Consent: I have reviewed the patients History and Physical, chart, labs and discussed the procedure including the risks, benefits and alternatives for the proposed anesthesia with the patient or authorized representative who has indicated his/her understanding and acceptance.   Dental advisory given  Plan Discussed with: CRNA  Anesthesia Plan Comments:        Anesthesia Quick Evaluation

## 2016-05-18 NOTE — Op Note (Signed)
PATIENT:  Halo Nudelman  PRE-OPERATIVE DIAGNOSIS: Urinary retention of unknown etiology.  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 TURP 2. Examination under anesthesia  SURGEON:  [place]  INDICATION: Akio Hudnall is a 66 year old male with mental retardation and schizophrenia who has had a long history of an elevated PVR but maintained a normal serum creatinine and was not having difficulty with infections however he was admitted to the hospital with urinary retention after several voiding trials were attempted but failed. His urine was noted to be clear on admission but there did appear to be some bacteria so it was cultured during his hospitalization although the culture was negative. He has been on preoperative Rocephin and due to his condition no rectal examination is within performed and the cause of his urinary retention had not been determined with cystoscopic evaluation however based on his age I felt evaluation and management of any form of obstruction should be undertaken if it was found and therefore he is brought to the operating room at this time for those reasons.  ANESTHESIA:  General  EBL:  Minimal  DRAINS: 20 [place], three-way Foley catheter with 30 mL balloon  SPECIMEN:  Prostate chips to pathology  After informed consent the patient was brought to the major OR and placed on the table. He was administered general anesthesia and then moved to the dorsal lithotomy position. His genitalia was sterilely prepped and draped and an official timeout was then performed.  A rectal examination was performed and I noted a small prostate that was smooth, symmetric and free of any nodularity or induration.  Initially the 26 French resectoscope sheath with the visual obturator was passed into the bladder and the obturator removed. I then inserted the resectoscope element with 30 lens and  performed a systematic  inspection of the bladder. I noted the bladder had 3+ trabeculation but was free of any tumor stones or inflammatory lesions. The ureteral orifices were noted to be of normal configuration and position. Withdrawing the scope into the prostatic urethra I noted obstructing bilobar hypertrophy with a fairly short urethra with no median lobe component.  Resection was then begun  I  began resecting the left lobe of the prostate by resecting first from the level of the bladder neck back to the level of the Veru at the 5:00 position and then progressed in a counterclockwise direction resecting all of the adenomatous tissue of the left lobe down to the surgical capsule. Bleeding points were cauterized as they were encountered. I then turned my attention to the right lobe of the prostate and it was resected in an identical fashion. Tissue in the area of the apex was then resected circumferentially with care being taken to maintain the resection proximal to the Veru at all times. The prostatic chips were then flushed into the bladder and the Microvasive evacuator was then used to evacuate all chips from the bladder. Reinspection of the bladder revealed the mucosa to be intact, the ureteral orifices intact as well and well away from the bladder neck and area of resection. There were no prostatic chips remaining within the bladder. The prostatic capsule was intact throughout with no perforation and there was no active bleeding noted at the end of the procedure.  The resectoscope was therefore removed and the 20 French three-way Foley catheter was then inserted and balloon was filled to 30 cc. The catheter was then hooked to closed system drainage and continuous irrigation  and the patient was awakened and taken to recovery room in stable and satisfactory condition. He tolerated the procedure well and there were no intraoperative complications.  PLAN OF CARE: Observation overnight with anticipated discharge in the  morning.  PATIENT DISPOSITION:  PACU - Hemodynamically stable.

## 2016-05-18 NOTE — Progress Notes (Signed)
To OR

## 2016-05-18 NOTE — Anesthesia Procedure Notes (Addendum)
Procedure Name: LMA Insertion Date/Time: 05/18/2016 12:36 PM Performed by: Doran ClayALDAY, Arah Aro R Pre-anesthesia Checklist: Patient identified, Emergency Drugs available, Suction available, Patient being monitored and Timeout performed Patient Re-evaluated:Patient Re-evaluated prior to inductionOxygen Delivery Method: Circle system utilized Preoxygenation: Pre-oxygenation with 100% oxygen Intubation Type: IV induction LMA: LMA flexible inserted LMA Size: 4.0 Number of attempts: 1 Placement Confirmation: positive ETCO2 and breath sounds checked- equal and bilateral Tube secured with: Tape Dental Injury: Teeth and Oropharynx as per pre-operative assessment

## 2016-05-18 NOTE — Progress Notes (Signed)
PROGRESS NOTE    Antonio Montoya   ZOX:[id number]  DOB: January 29, 1951  DOA: 05/01/2016 PCP: Shayne Alken, MD (Inactive)    Brief Narrative:  Antonio Montoya is a 66 y.o. male with DM diet controlled, mental retardation and schizophrenia had come to the ER 2/19 for urinary retention (bladder scan showed > 1 L urine and caretaker stated he had not urinated in 18 hrs). He had Foley catheter placed and sent back to group home.  Patient is unable to give history- per ER HPI, he came if in for hematuria after pulling on his foley cath. He has beem increasingly agitated for a few wks per caregiver. Caregiver stated pt recently had a fit and broke several items including a glass ~ 3 days prior to admission & had swelling to the left hand on the day of admission .  In the ER patient had to be sedated with Ativan. He was found to have hypothermia with temp of 95.7.  This is the 5th visit to the ER since Jan of this year.  1/21- stumbled, fell, abrasion to forehead 2/4- left subconjunctival hemorrhage 2/16- off balance- head CT ok  2/16- U retention  Subjective: Non-verbal-   Assessment & Plan:   Principal Problem:   Acute exacerbation of underlying psych issues with new hypothermia & Urinary retention  - his group home does not want him back -as the foley may be the cause of his increased agitation,  I asked RN to pull foley  - agitation did not improve- foley did have to be re-inserted due to retention and psych consulted for assistance - the following changed made thus far by psych:  decreased Haldol from 25 to 15 QHS and now Haldol 5 mg BID Added 1mg  Ativan QHS  now changed to 0.5 mg BID with 1mg  Q 6 PRN Lamictal decreased from 125 in AM and 150 in PM  to 100  BID Risperdal ( which was given routine and PRN at home) stopped-  Oxcarbazine stopped Tegretol 200 mg BID added  Cogentin decreased from 1mg  daily to 0.5 mg daily Saphris 5mg  Q12 PRN for agitation added  - hypothermia resolved,   transferred out of SDU   -last seen by psych on 2/26- on 2/28, I spoke with Dr Magdalen Spatz who states that the patient has a traumatic brain injury which is causing his behavioral issues and there are no further medication changes he can make for him -  per RNs, agitation has not resolved despite using the PRN medications ordered by psych. He bites, punches and kicks when taken out of restraints   - 3/8 -  increased Ativan from 0.5 mg BID to 1 mgTID- still very agitated, pulled right arm out of restraint this AM - will d/c Ativan in case it is having a paradoxial effect    Active Problems:  U retention/ AKI - initially presented to ER on 2/19- foley placed- started 0.8 mg of Flomax and sent back to Group home - voiding trial 2/21- we were hoping his agitation would resolve with removing foley as well but he failed trial and agitation did not improve - Second voiding trial given on 1/26- failed this one as well - as he has failed a voiding trial twice, I cannot remove catheter at this point- -Urology has evaluated him and recommend to continue foley, check Ua and U culture- decision will need to be made on whether to do a cystoscopy and resection of prostate tissues - UA + (has foley) and  U culture pending- Ceftriaxone started  by Urology - plan for cystoscopy and possible TURP today  Swelling of left hand - xray does not show fracture --initially suspected to be cellulitis in setting of hypothermia-  given Clinda  in ER and then zosyn & Vanc - Vanc stopped 2/21 and Zosyn continued- hand swelling not improving with Zosyn therefore I doubt it is cellulitis and is likely a traumatic injury-  (based on H and P) -  hand is non tender to touch - stopped Zosyn 2/23 - swelling has resolved after stopping Zosyn    Hypothermia - no source of sepsis found for this- blood cultures negative, UA negative - no endocrine cause found- cortisol and thyroid functions are normal - suspect poor thermoregulation  due to antipsychotics- have spoken with psych about this-  Temps have improved  Elevated LFTs -  CT abd shows no signs of infection- he has a mild CBD dilatation- doubt this is significant - suspect LFTs are elevated due to psych medications/ hypothermia   Hypoglycemia - sugar 22 on 2/23- which was likely not accurate as he has had no other drops < 50- will stop checking  Bradycardia - present since admission - HR 50-60 when awake- drops to 30-40s when he sleeps- BP has been stable  Thrombocytopenia - noted on 2/16 and has progressed- medication related from Zosyn? - stopped Zosyn on 2/23- - following daily- improved to normal    Pressure injury of skin Pressure Injury 05/01/16 Stage II - left buttock- Partial thickness loss of dermis presenting as a shallow open ulcer with a red, pink wound bed without slough. irregular in size - white granulation tissue to area.     DVT prophylaxis: SCDs Code Status: Full code Family Communication:  Disposition Plan: plan per psych- once foley is removed, hopefully can return to group home Consultants:   psych Procedures:    Antimicrobials:  Anti-infectives    Start     Dose/Rate Route Frequency Ordered Stop   05/18/16 1046  ciprofloxacin (CIPRO) IVPB 400 mg     400 mg 200 mL/hr over 60 Minutes Intravenous 60 min pre-op 05/18/16 1046 05/18/16 1235   05/16/16 0600  cefTRIAXone (ROCEPHIN) 1 g in dextrose 5 % 50 mL IVPB  Status:  Discontinued     1 g 100 mL/hr over 30 Minutes Intravenous Every 12 hours 05/16/16 0412 05/16/16 0443   05/16/16 0600  cefTRIAXone (ROCEPHIN) 1 g in dextrose 5 % 50 mL IVPB  Status:  Discontinued     1 g 100 mL/hr over 30 Minutes Intravenous Every 24 hours 05/16/16 0443 05/18/16 1316   05/02/16 2000  vancomycin (VANCOCIN) IVPB 750 mg/150 ml premix  Status:  Discontinued     750 mg 150 mL/hr over 60 Minutes Intravenous Every 12 hours 05/02/16 0643 05/03/16 0805   05/02/16 1400  piperacillin-tazobactam (ZOSYN) IVPB  3.375 g  Status:  Discontinued     3.375 g 12.5 mL/hr over 240 Minutes Intravenous Every 8 hours 05/02/16 0532 05/04/16 1125   05/02/16 0415  piperacillin-tazobactam (ZOSYN) IVPB 3.375 g     3.375 g 100 mL/hr over 30 Minutes Intravenous  Once 05/02/16 0407 05/02/16 0553   05/02/16 0415  vancomycin (VANCOCIN) IVPB 1000 mg/200 mL premix     1,000 mg 200 mL/hr over 60 Minutes Intravenous  Once 05/02/16 0407 05/02/16 0745   05/01/16 2200  clindamycin (CLEOCIN) IVPB 600 mg     600 mg 100 mL/hr over 30 Minutes Intravenous  Once 05/01/16  2148 05/02/16 0010       Objective: Vitals:   05/17/16 1704 05/18/16 1321 05/18/16 1330 05/18/16 1345  BP:  (!) 103/58 (!) 102/57 (!) 101/48  Pulse:  (!) 53 (!) 52   Resp:  11 14 13   Temp: 97.5 F (36.4 C)     TempSrc: Axillary     SpO2:  100% 100% 100%  Weight:      Height:        Intake/Output Summary (Last 24 hours) at 05/18/16 1402 Last data filed at 05/18/16 1345  Gross per 24 hour  Intake              860 ml  Output             1600 ml  Net             -740 ml   Filed Weights   05/02/16 0640  Weight: 71 kg (156 lb 8.4 oz)    Examination: General exam: alert, nonverbal, does not follow commands HEENT: PERRLA, oral mucosa moist, no sclera icterus or thrush Respiratory system: Clear to auscultation. Respiratory effort normal. Cardiovascular system: S1 & S2 heard, RRR.  No murmurs  Gastrointestinal system: Abdomen soft, non-tender, nondistended. Normal bowel sound. No organomegaly Central nervous system: moves all extremities Extremities: No cyanosis, clubbing- swelling of left hand resolved Skin: No rashes or ulcers Psychiatry:  Intermittently agitated requiring sedation    Data Reviewed: I have personally reviewed following labs and imaging studies  CBC:  Recent Labs Lab 05/16/16 0604  WBC 5.3  HGB 10.4*  HCT 29.7*  MCV 93.1  PLT 234   Basic Metabolic Panel:  Recent Labs Lab 05/16/16 0604  NA 141  K 3.6  CL 106    CO2 29  GLUCOSE 80  BUN 19  CREATININE 1.05  CALCIUM 9.5   GFR: Estimated Creatinine Clearance: 71.4 mL/min (by C-G formula based on SCr of 1.05 mg/dL). Liver Function Tests: No results for input(s): AST, ALT, ALKPHOS, BILITOT, PROT, ALBUMIN in the last 168 hours. No results for input(s): LIPASE, AMYLASE in the last 168 hours. No results for input(s): AMMONIA in the last 168 hours. Coagulation Profile: No results for input(s): INR, PROTIME in the last 168 hours. Cardiac Enzymes: No results for input(s): CKTOTAL, CKMB, CKMBINDEX, TROPONINI in the last 168 hours. BNP (last 3 results) No results for input(s): PROBNP in the last 8760 hours. HbA1C: No results for input(s): HGBA1C in the last 72 hours. CBG: No results for input(s): GLUCAP in the last 168 hours. Lipid Profile: No results for input(s): CHOL, HDL, LDLCALC, TRIG, CHOLHDL, LDLDIRECT in the last 72 hours. Thyroid Function Tests: No results for input(s): TSH, T4TOTAL, FREET4, T3FREE, THYROIDAB in the last 72 hours. Anemia Panel: No results for input(s): VITAMINB12, FOLATE, FERRITIN, TIBC, IRON, RETICCTPCT in the last 72 hours. Urine analysis:    Component Value Date/Time   COLORURINE YELLOW 05/15/2016 0939   APPEARANCEUR CLOUDY (A) 05/15/2016 0939   LABSPEC 1.010 05/15/2016 0939   PHURINE 7.5 05/15/2016 0939   GLUCOSEU NEGATIVE 05/15/2016 0939   HGBUR LARGE (A) 05/15/2016 0939   BILIRUBINUR NEGATIVE 05/15/2016 0939   KETONESUR NEGATIVE 05/15/2016 0939   PROTEINUR 30 (A) 05/15/2016 0939   UROBILINOGEN 1.0 01/16/2012 1957   NITRITE NEGATIVE 05/15/2016 0939   LEUKOCYTESUR TRACE (A) 05/15/2016 0939   Sepsis Labs: @LABRCNTIP (procalcitonin:4,lacticidven:4) ) Recent Results (from the past 240 hour(s))  Culture, blood (routine x 2)     Status: None   Collection Time:  05/10/16 11:00 PM  Result Value Ref Range Status   Specimen Description BLOOD LEFT ARM  Final   Special Requests IN PEDIATRIC BOTTLE 1.5 CC  Final    Culture   Final    NO GROWTH 5 DAYS Performed at [facility], [address] [place], [place] [address]    Report Status 05/16/2016 FINAL  Final  Culture, blood (routine x 2)     Status: Abnormal   Collection Time: 05/10/16 11:00 PM  Result Value Ref Range Status   Specimen Description BLOOD RIGHT FOREARM  Final   Special Requests IN PEDIATRIC BOTTLE 3 CC  Final   Culture  Setup Time   Final    GRAM POSITIVE COCCI IN CLUSTERS IN PEDIATRIC BOTTLE CRITICAL RESULT CALLED TO, READ BACK BY AND VERIFIED WITH: T. GREEN, PHARM AT [facility], 05/12/16 AT 1738 BY J FUDESCO    Culture (A)  Final    STAPHYLOCOCCUS SPECIES (COAGULASE NEGATIVE) THE SIGNIFICANCE OF ISOLATING THIS ORGANISM FROM A SINGLE SET OF BLOOD CULTURES WHEN MULTIPLE SETS ARE DRAWN IS UNCERTAIN. PLEASE NOTIFY THE MICROBIOLOGY DEPARTMENT WITHIN ONE WEEK IF SPECIATION AND SENSITIVITIES ARE REQUIRED. Performed at [facility], [address] [place], [place] [address]    Report Status 05/14/2016 FINAL  Final  Blood Culture ID Panel (Reflexed)     Status: None   Collection Time: 05/10/16 11:00 PM  Result Value Ref Range Status   Enterococcus species NOT DETECTED NOT DETECTED Final   Listeria monocytogenes NOT DETECTED NOT DETECTED Final   Staphylococcus species NOT DETECTED NOT DETECTED Final   Staphylococcus aureus NOT DETECTED NOT DETECTED Final   Streptococcus species NOT DETECTED NOT DETECTED Final   Streptococcus agalactiae NOT DETECTED NOT DETECTED Final   Streptococcus pneumoniae NOT DETECTED NOT DETECTED Final   Streptococcus pyogenes NOT DETECTED NOT DETECTED Final   Acinetobacter baumannii NOT DETECTED NOT DETECTED Final   Enterobacteriaceae species NOT DETECTED NOT DETECTED Final   Enterobacter cloacae complex NOT DETECTED NOT DETECTED Final   Escherichia coli NOT DETECTED NOT DETECTED Final   Klebsiella oxytoca NOT DETECTED NOT DETECTED Final   Klebsiella pneumoniae NOT DETECTED NOT DETECTED Final   Proteus  species NOT DETECTED NOT DETECTED Final   Serratia marcescens NOT DETECTED NOT DETECTED Final   Haemophilus influenzae NOT DETECTED NOT DETECTED Final   Neisseria meningitidis NOT DETECTED NOT DETECTED Final   Pseudomonas aeruginosa NOT DETECTED NOT DETECTED Final   Candida albicans NOT DETECTED NOT DETECTED Final   Candida glabrata NOT DETECTED NOT DETECTED Final   Candida krusei NOT DETECTED NOT DETECTED Final   Candida parapsilosis NOT DETECTED NOT DETECTED Final   Candida tropicalis NOT DETECTED NOT DETECTED Final  Urine culture     Status: None   Collection Time: 05/15/16  9:39 AM  Result Value Ref Range Status   Specimen Description URINE, CATHETERIZED  Final   Special Requests Normal  Final   Culture   Final    NO GROWTH Performed at [facility], [address], [place], [place] [address]    Report Status 05/16/2016 FINAL  Final         Radiology Studies: No results found.    Scheduled Meds: . [MAR Hold] benztropine  0.5 mg Oral Daily  . [MAR Hold] carbamazepine  200 mg Oral BID PC  . [MAR Hold] feeding supplement (ENSURE ENLIVE)  237 mL Oral BID BM  . fentaNYL      . [MAR Hold] haloperidol  5 mg Oral BID  . [MAR Hold] lamoTRIgine  100 mg Oral BID  . [MAR Hold] pantoprazole  40 mg Oral Daily  . [MAR Hold] polycarbophil  625 mg Oral q morning - 10a  . [MAR Hold] potassium chloride  10 mEq Oral q morning - 10a   Continuous Infusions:    LOS: 16 days    Time spent in minutes: 35    Bergen Melle, MD [facility] Pager: [email or web address] Password [email or web address] 05/18/2016, 2:02 PM

## 2016-05-18 NOTE — H&P (View-Only) (Signed)
New Urology Consult Note   Requesting Attending Physician:  Shon Haleourage Emokpae, MD Service Providing Consult: Urology Consulting Attending: Mccabe Gloria/Ottelin  Assessment:  Patient is a 66 y.o. male with mental retardation and schizophrenia with one episode of acute urinary retention 5 years ago presents with agitation and acute urinary retention requiring Foley catheter placement.  He is failed 2 trials of voids during this hospitalization per report.  Creatinine is normal at 1.1,his upper urinary tract appears normal on CT scan.  Recommendations: 1. Continue Flomax 0.8 mg 2.  Patient presents a complex social dilemma.  Would likely benefit from Foley catheterization for the immediate future, however prolonged Foley catheterization and/or suprapubic catheter would likely not be in his best interest due to agitation and risk of trauma due to the patient pulling on the catheters. We could consider cystoscopic evaluation with possible transurethral resection of the prostate as an alternative however this will need to be further discussed with Dr. Vernie Ammonsttelin prior to concrete plans being made 3.  Would continue Foley catheter for now until further discussion was had with Dr. Vernie Ammonsttelin on 3/6  Thank you for this consult. Please contact the urology consult pager with any further questions/concerns. Jalene MulletMatthew Macey, MD Urology Surgical Resident   HPI -   Reason for Consult:  Urinary retention  Antonio Montoya is seen in consultation for reasons noted above at the request of Shon Haleourage Emokpae, MD.  This is a 66 yo patient with a history of mental retardation and schizophrenia.  He previously had acute urinary retention in November 2013 for which he was started on Flomax.  He was seen in the [facility] on February 09, 2016 with Dr. Vernie Ammonsttelin.  He was seen in the emergency department on February 20 again with acute urinary retention were a Foley catheter was placed with 700 output.  He is currently on  Flomax 0.8 dosing.  He will return to the emergency department on February 21 due to agitation with attempts to remove the Foley catheter and resultant gross hematuria.  He has been followed by psychiatry here for assistance with his agitation and medications.  That has been commented that his acute urinary retention may be associated with changes in his antipsychotic medication.  There was a thought that his Foley catheter was contributing to his agitation and therefore is removed on February 21 after 1 day of being in place.  His agitation did not improve after removal and was replaced on February 22.  It was then again removed on February 26 and replaced the same day on February 26.  He is unable to provide a history at all.  I attempted to contact his sister on the phone but was unable to do so.  I contacted the caregiver on file who was able to tell me that the patient had 1 previous acute urinary retention episode.  His creatinine is 1.1.  His urinalysis from February 20 on his initial visit was normal.   Past Medical History: Past Medical History:  Diagnosis Date  . MR (mental retardation)   . Schizophrenia [facility](HCC)     Past Surgical History:  History reviewed. No pertinent surgical history.  Medication: Current Facility-Administered Medications  Medication Dose Route Frequency Provider Last Rate Last Dose  . acetaminophen (TYLENOL) tablet 650 mg  650 mg Oral Q6H PRN Eduard ClosArshad N Kakrakandy, MD       Or  . acetaminophen (TYLENOL) suppository 650 mg  650 mg Rectal Q6H PRN Eduard ClosArshad N Kakrakandy, MD      .  asenapine (SAPHRIS) sublingual tablet 5 mg  5 mg Sublingual Q12H PRN Thedore Mins, MD   5 mg at 05/14/16 1859  . benztropine (COGENTIN) tablet 0.5 mg  0.5 mg Oral Daily Mojeed Akintayo, MD   0.5 mg at 05/14/16 1016  . carbamazepine (TEGRETOL) tablet 200 mg  200 mg Oral BID PC Mojeed Akintayo, MD   200 mg at 05/14/16 1738  . feeding supplement (ENSURE ENLIVE) (ENSURE ENLIVE) liquid 237 mL  237  mL Oral BID BM [name], MD   237 mL at 05/14/16 1016  . haloperidol (HALDOL) tablet 5 mg  5 mg Oral BID Thedore Mins, MD   5 mg at 05/14/16 1015  . lamoTRIgine (LAMICTAL) tablet 100 mg  100 mg Oral BID Eduard Clos, MD   100 mg at 05/14/16 1016  . LORazepam (ATIVAN) injection 1 mg  1 mg Intravenous Q6H PRN Calvert Cantor, MD   1 mg at 05/14/16 0427  . LORazepam (ATIVAN) tablet 0.5 mg  0.5 mg Oral BID Thedore Mins, MD   0.5 mg at 05/14/16 1015  . ondansetron (ZOFRAN) tablet 4 mg  4 mg Oral Q6H PRN Eduard Clos, MD       Or  . ondansetron [facility]) injection 4 mg  4 mg Intravenous Q6H PRN Eduard Clos, MD      . pantoprazole (PROTONIX) EC tablet 40 mg  40 mg Oral Daily Eduard Clos, MD   40 mg at 05/14/16 1015  . polycarbophil (FIBERCON) tablet 625 mg  625 mg Oral q morning - 10a Eduard Clos, MD   625 mg at 05/14/16 1016  . potassium chloride (K-DUR,KLOR-CON) CR tablet 10 mEq  10 mEq Oral q morning - 10a Eduard Clos, MD   10 mEq at 05/14/16 1015  . tamsulosin (FLOMAX) capsule 0.8 mg  0.8 mg Oral Daily Eduard Clos, MD   0.8 mg at 05/14/16 1015    Allergies: No Known Allergies  Social History: Social History  Substance Use Topics  . Smoking status: Never Smoker  . Smokeless tobacco: Never Used  . Alcohol use No    Family History Family History  Problem Relation Age of Onset  . Family history unknown: Yes    Review of Systems 10 systems were reviewed and are negative except as noted specifically in the HPI.  Objective   Vital signs in last 24 hours: BP (!) 150/54 (BP Location: Right Arm)   Pulse (!) 53   Temp (!) 94.2 F (34.6 C) (Rectal)   Resp 16   Ht 6' (1.829 m)   Wt 71 kg (156 lb 8.4 oz)   SpO2 100%   BMI 21.23 kg/m   Intake/Output last 3 shifts: I/O last 3 completed shifts: In: 2000 [P.O.:2000] Out: 3250 [Urine:3250]  Physical Exam General: NAD, agitated, nonverbal, in restraints HEENT: [place]/AT, EOMI,  MMM, often drools Pulmonary: Normal work of breathing on RA Cardiovascular: Regular rate & rhythm, HDS, adequate peripheral perfusion Abdomen: soft, NTTP, nondistended, no suprapubic fullness or tenderness GU: Foley catheter draining clear yellow urine DRE: not attempted due to patient mental condition and no chaperone present Extremities: warm and well perfused, no edema  Most Recent Labs: Lab Results  Component Value Date   WBC 7.5 05/09/2016   HGB 11.4 (L) 05/09/2016   HCT 32.3 (L) 05/09/2016   PLT 211 05/09/2016    Lab Results  Component Value Date   NA 144 05/08/2016   K 3.6 05/08/2016   CL  107 05/08/2016   CO2 32 05/08/2016   BUN 20 05/08/2016   CREATININE 1.12 05/08/2016   CALCIUM 9.7 05/08/2016   MG 2.4 05/02/2016    Lab Results  Component Value Date   ALKPHOS 113 05/08/2016   BILITOT 0.5 05/08/2016   PROT 6.6 05/08/2016   ALBUMIN 3.4 (L) 05/08/2016   ALT 43 05/08/2016   AST 45 (H) 05/08/2016    Lab Results  Component Value Date   INR 1.01 05/02/2016   APTT 30 05/02/2016     Urine Culture: None   IMAGING:  2/22 CT with contrast personally reviewed - bilateral sm renal cysts

## 2016-05-18 NOTE — Progress Notes (Signed)
Night of surgery note  Subjective: The patient appears to be doing well.  Objective: Vital signs in last 24 hours: Temp:  [96.7 F (35.9 C)-97.5 F (36.4 C)] 96.7 F (35.9 C) (03/09 1422) Pulse Rate:  [50-58] (P) 58 (03/09 1543) Resp:  [11-25] (P) 18 (03/09 1543) BP: (84-103)/(39-58) (P) 112/58 (03/09 1543) SpO2:  [100 %] 100 % (03/09 1420)  Intake/Output this shift: Total I/O In: 500 [I.V.:500] Out: 1100 [Urine:1100]  Physical Exam:  General: Appears to be in no distress. GU: Foley draining slightly pink on CBI  Lab Results:  Recent Labs  05/16/16 0604  HGB 10.4*  HCT 29.7*    Assessment/Plan: 1) Continue to monitor on CBI overnight. 2) D/C foley at 0500 for voiding trial. 3) Can D/C in AM from GU standpoint.   Lacie Landry C. Vernie Ammonsttelin, MD  Garnett FarmTTELIN,Karlton Maya C 05/18/2016, 4:03 PM

## 2016-05-18 NOTE — Progress Notes (Signed)
LCSWA called Antonio Montoya with updates, per physician patient will be ready for discharge as earliest as tomorrow. Antonio Montoya will pick patient up when reasy for dischrage and transport him to the Group Home.   Antonio Montoya plans to visit with patient this evening.   Weekend CSW will be available if social service needs arise tomorrow.

## 2016-05-18 NOTE — Progress Notes (Signed)
Pt temp 94.7 axillary. Reported to receiving RN. Per previous notes MD is aware of intermittent low temps. All other vital signs stable. Covered with several warmed blankets. Nurse April will continue to monitor.  Ardyth GalAnderson, Tru Leopard Ann, RN 05/18/2016

## 2016-05-18 NOTE — Transfer of Care (Signed)
Immediate Anesthesia Transfer of Care Note  Patient: Antonio Montoya  Procedure(s) Performed: Procedure(s): TRANSURETHRAL RESECTION OF THE PROSTATE (TURP) (N/A) CYSTOSCOPY (N/A)  Patient Location: PACU  Anesthesia Type:General  Level of Consciousness: sedated  Airway & Oxygen Therapy: Patient Spontanous Breathing and Patient connected to nasal cannula oxygen  Post-op Assessment: Report given to RN and Post -op Vital signs reviewed and stable  Post vital signs: Reviewed and stable  Last Vitals:  Vitals:   05/17/16 1326 05/17/16 1704  BP: (!) 149/109   Pulse: 88   Resp: 20   Temp: (!) 34.5 C 36.4 C    Last Pain:  Vitals:   05/17/16 1704  TempSrc: Axillary  PainSc:          Complications: No apparent anesthesia complications

## 2016-05-18 NOTE — Progress Notes (Signed)
LCSWA spoke with Mr. Antonio Montoya about patient disposition. The plan continues, if foley catheter is remove patient may return to Group Home.   CSW will continue to follow and assist with disposition.

## 2016-05-19 MED ORDER — ACETAMINOPHEN 325 MG PO TABS: 650.0000 mg | ORAL_TABLET | Freq: Four times a day (QID) | ORAL | Status: DC | PRN

## 2016-05-19 MED ORDER — TAMSULOSIN HCL 0.4 MG PO CAPS
0.4000 mg | ORAL_CAPSULE | Freq: Every day | ORAL | Status: DC
  Administered 2016-05-19 – 2016-05-24 (×6): 0.4 mg via ORAL
  Filled 2016-05-19 (×6): qty 1

## 2016-05-19 MED ORDER — SODIUM CHLORIDE 0.9 % IV BOLUS (SEPSIS)
1000.0000 mL | Freq: Once | INTRAVENOUS | Status: AC
  Administered 2016-05-19: 1000 mL via INTRAVENOUS

## 2016-05-19 MED ORDER — ASENAPINE MALEATE 5 MG SL SUBL: 5.0000 mg | SUBLINGUAL_TABLET | Freq: Two times a day (BID) | SUBLINGUAL | 0 refills | Status: DC | PRN

## 2016-05-19 MED ORDER — CARBAMAZEPINE 200 MG PO TABS: 200.0000 mg | ORAL_TABLET | Freq: Two times a day (BID) | ORAL | 0 refills | Status: DC

## 2016-05-19 MED ORDER — HALOPERIDOL 5 MG PO TABS: 5.0000 mg | ORAL_TABLET | Freq: Two times a day (BID) | ORAL | 0 refills | Status: DC

## 2016-05-19 NOTE — Consult Note (Signed)
Urology Consult Note   Requesting Attending Physician:  Calvert CantorSaima Rizwan, MD Service Providing Consult: Urology Consulting Attending: Ottelin  Assessment:  Patient is a 66 y.o. male with mental retardation and schizophrenia with one episode of acute urinary retention 5 years ago presented with agitation and acute urinary retention requiring Foley catheter placement.  He has failed 2 trials of voids during this hospitalization per report.  Creatinine is normal at 1.1, his upper urinary tract appears normal on CT scan. Now s/p cysto with TURP 05/18/16 with Dr. Vernie Ammonsttelin.  Interval: S/p TURP yesterday. NAEON. Unaccompanied this morning. Afebrile (hypothermic which he has been during hospitalization). Bradycardic in 50s for the majority of yesterday, HR 123 this morning but normal Bp. Was kept on CBI postop but discontinued and Foley removed this morning at 5am. Now with condom catheter applied with no urine in tubing as of our visit around 8am.   Recommendations: 1. If he passes trial of void would be suitable for discharge from our perspective 2. Bladder scan at 12pm if he has yet to void  Thank you for this consult. Please contact the urology consult pager with any further questions/concerns. Jalene MulletMatthew Shenelle Klas, MD Urology Surgical Resident  Subjective Unable to provide history. Unaccompanied. In NAD.  Objective   Vital signs in last 24 hours: BP 134/83 (BP Location: Left Arm)   Pulse (!) 123   Temp (!) 96.7 F (35.9 C)   Resp 18   Ht 6' (1.829 m)   Wt 71 kg (156 lb 8.4 oz)   SpO2 92%   BMI 21.23 kg/m   Intake/Output last 3 shifts: I/O last 3 completed shifts: In: 3270 [P.O.:170; I.V.:500; Other:2600] Out: 3700 [Urine:3700]  Physical Exam General: NAD, nonverbal, in restraints HEENT: [place]/AT, EOMI, MMM Pulmonary: Normal work of breathing on RA Cardiovascular: Regular rate & rhythm, HDS, adequate peripheral perfusion Abdomen: soft, NTTP, nondistended, no suprapubic fullness or  tenderness GU: condom catheter applied with no urine in tubing Extremities: warm and well perfused, no edema  Most Recent Labs: Lab Results  Component Value Date   WBC 5.3 05/16/2016   HGB 10.4 (L) 05/16/2016   HCT 29.7 (L) 05/16/2016   PLT 234 05/16/2016    Lab Results  Component Value Date   NA 141 05/16/2016   K 3.6 05/16/2016   CL 106 05/16/2016   CO2 29 05/16/2016   BUN 19 05/16/2016   CREATININE 1.05 05/16/2016   CALCIUM 9.5 05/16/2016   MG 2.4 05/02/2016    Lab Results  Component Value Date   ALKPHOS 113 05/08/2016   BILITOT 0.5 05/08/2016   PROT 6.6 05/08/2016   ALBUMIN 3.4 (L) 05/08/2016   ALT 43 05/08/2016   AST 45 (H) 05/08/2016    Lab Results  Component Value Date   INR 1.01 05/02/2016   APTT 30 05/02/2016     Urine Culture: 05/15/16 no growth   IMAGING:  2/22 CT with contrast personally reviewed - bilateral sm renal cysts

## 2016-05-19 NOTE — Discharge Instructions (Signed)
Please make sure he drinks at least 6-8 glasses of water a day.  Post transurethral resection of the prostate (TURP) instructions  Your recent prostate surgery requires very special post hospital care. Despite the fact that no skin incisions were used the area around the prostate incision is quite raw and is covered with a scab to promote healing and prevent bleeding. Certain cautions are needed to assure that the scab is not disturbed of the next 2-3 weeks while the healing proceeds.  Because the raw surface in your prostate and the irritating effects of urine you may expect frequency of urination and/or urgency (a stronger desire to urinate) and perhaps even getting up at night more often. This will usually resolve or improve slowly over the healing period. You may see some blood in your urine over the first 6 weeks. Do not be alarmed, even if the urine was clear for a while. Get off your feet and drink lots of fluids until clearing occurs. If you start to pass clots or don't improve call us.  Catheter: (If you are discharged with a catheter.) 1. Keep your catheter secured to your leg at all times with tape or the supplied strap. 2. You may experience leakage of urine around your catheter- as long as the  catheter continues to drain, this is normal.  If your catheter stops draining  go to the ER. 3. You may also have blood in your urine, even after it has been clear for  several days; you may even pass some small blood clots or other material.  This  is normal as well.  If this happens, sit down and drink plenty of water to help  make urine to flush out your bladder.  If the blood in your urine becomes worse  after doing this, contact our office or return to the ER. 4. You may use the leg bag (small bag) during the day, but use the large bag at  night.  Diet:  You may return to your normal diet immediately. Because of the raw surface of your bladder, alcohol, spicy foods, foods high in acid  and drinks with caffeine may cause irritation or frequency and should be used in moderation. To keep your urine flowing freely and avoid constipation, drink plenty of fluids during the day (8-10 glasses). Tip: Avoid cranberry juice because it is very acidic.  Activity:  Your physical activity doesn't need to be restricted. However, if you are very active, you may see some blood in the urine. We suggest that you reduce your activity under the circumstances until the bleeding has stopped.  Bowels:  It is important to keep your bowels regular during the postoperative period. Straining with bowel movements can cause bleeding. A bowel movement every other day is reasonable. Use a mild laxative if needed, such as milk of magnesia 2-3 tablespoons, or 2 Dulcolax tablets. Call if you continue to have problems. If you had been taking narcotics for pain, before, during or after your surgery, you may be constipated. Take a laxative if necessary.  Medication:  You should resume your pre-surgery medications unless told not to. DO NOT RESUME YOUR ASPIRIN, WARFARIN, OR OTHER BLOOD THINNER FOR 1 WEEK. In addition you may be given an antibiotic to prevent or treat infection. Antibiotics are not always necessary. All medication should be taken as prescribed until the bottles are finished unless you are having an unusual reaction to one of the drugs.     Problems you should report  to us:  a. Fever greater than 101F. b. Heavy bleeding, or clots (see notes above about blood in urine). c. Inability to urinate. d. Drug reactions (hives, rash, nausea, vomiting, diarrhea). e. Severe burning or pain with urination that is not improving.

## 2016-05-19 NOTE — Discharge Summary (Signed)
Physician Discharge Summary  Raequon Catanzaro ZOX:[id number] DOB: 10-22-1950 DOA: 05/01/2016  PCP: Shayne Alken, MD (Inactive)  Admit date: 05/01/2016 Discharge date: 05/19/2016  Admitted From: group home Disposition:  Group home   Recommendations for Outpatient Follow-up:  1. F/u with Dr Vernie Ammons from Urology- please call their office and ask when Dr Vernie Ammons would like to see him  Home Health:  ordered Equipment/Devices:  none    Discharge Condition:  stable   CODE STATUS:  Full code   Diet recommendation:  Heart heatlhy- needs to be fed Consultations:  Psych  Urology    Discharge Diagnoses:  Principal Problem:   Acute delirium Active Problems:   Pressure injury of skin   Hypothermia   Elevated LFTs   Urinary retention   Thrombocytopenia (HCC)    Subjective: Non - verbal  Brief Summary: Antonio Cassadyis a 66 y.o.malewithwith DM diet controlled, mental retardation and schizophrenia had come to the ER 2/19 for urinary retention (bladder scan showed > 1 L urine and caretaker stated he had not urinated in 18 hrs). He had Foley catheter placed and sent back to group home.  Patient is unable to give history- per ER HPI, he came if in for hematuria after pulling on his foley cath. He has beem increasingly agitated for a few wks per caregiver.Caregiver stated pt recently had a fit and broke several items including a glass ~ 3 days prior to admission & had swelling to the left hand on the day of admission .  In the ER patient had to be sedated with Ativan. He was found to have hypothermia with temp of 95.7.  This is the 5th visit to the ER since Jan of this year.   ER visits:  1/21- stumbled, fell, abrasion to forehead 2/4- left subconjunctival hemorrhage 2/16- off balance- head CT ok  2/16- U retention   Hospital Course:  Principal Problem:   Acute exacerbation of underlying psych issues with new hypothermia & Urinary retention -as the foley may be the cause of his  increased agitation,  I asked RN to pull foley  - agitation did not improve- foley did have to be re-inserted due to retention and psych consulted for assistance - the following changed made thus far by psych:  decreased Haldol from 25 to 15 QHS and then Haldol 5 mg BID Added 1mg  Ativan QHS  now changed to 0.5 mg BID with 1mg  Q 6 PRN Lamictal decreased from 125 in AM and 150 in PM  to 100  BID Risperdal ( which was given routine and PRN at home) stopped-  Oxcarbazine stopped Tegretol 200 mg BID added  Cogentin decreased from 1mg  daily to 0.5 mg daily  Saphris 5mg  Q12 PRN for agitation added  - hypothermia resolved,  transferred out of SDU   - he has a caretaker at the group home who takes care of him and states he has returned to his baseline  Active Problems:  U retention/ AKI - initially presented to ER on 2/19- foley placed- started 0.8 mg of Flomax and sent back to Group home - voiding trial 2/21- we were hoping his agitation would resolve with removing foley as well but he failed trial and agitation did not improve - Second voiding trial given on 1/26- failed this one as well - as he has failed a voiding trial twice, I cannot remove catheter - we were worried about him pulling it in fits of severe agitation  -Urology has evaluated him and  decided to  take him to the OR for cystoscopy and possible TURP - 3/9 - Cystoscopy and TURP performed by Dr Vernie Ammons - foley now out - stable to return to SNF without worry that he will pull on a foley and cause self harm - no longer needs Flomax - can use pyridium PRN if he appears to be having bladder spasms in the post op period  Swelling of left hand - xray does not show fracture --initially suspected to be cellulitis in setting of hypothermia-  given Clinda  in ER and then zosyn & Vanc - Vanc stopped 2/21 and Zosyn continued- hand swelling not improving with Zosyn therefore I doubt it is cellulitis and is likely a traumatic injury-  (based on  H and P) -  hand is non tender to touch - stopped Zosyn 2/23 - swelling has resolved after stopping Zosyn    Hypothermia - no source of sepsis found for this- blood cultures negative, UA negative - no endocrine cause found- cortisol and thyroid functions are normal - suspect poor thermoregulation due to antipsychotics- have spoken with psych about this-  Temps have improved by 2/27 with changes in medications  Elevated LFTs - mild elevation  AST 136, ALT 84 on admission -  CT abd shows no signs of infection- he has a mild CBD dilatation- doubt this is significant - suspect LFTs are elevated due to psych medications/ hypothermia   Hypoglycemia - sugar 22 on 2/23- which was likely not accurate as he has had no other drops < 50- will stop checking  Bradycardia - present since admission - HR 50-60 when awake- drops to 30-40s when he sleeps- BP has been stable - this has been his baseline in the hospital  Thrombocytopenia - noted on 2/16 and has progressed- medication related from Zosyn? - stopped Zosyn on 2/23- - following daily- improved to normal    Pressure injury of skin Pressure Injury 05/01/16 Stage II - left buttock- Partial thickness loss of dermis presenting as a shallow open ulcer with a red, pink wound bed without slough. irregular in size - white granulation tissue to area.     DVT prophylaxis: SCDs  Discharge Instructions  Discharge Instructions    Diet - low sodium heart healthy    Complete by:  As directed    Increase activity slowly    Complete by:  As directed      Allergies as of 05/19/2016   No Known Allergies     Medication List    STOP taking these medications   Oxcarbazepine 300 MG tablet Commonly known as:  TRILEPTAL   risperiDONE 2 MG disintegrating tablet Commonly known as:  RISPERDAL M-TABS   risperiDONE 3 MG tablet Commonly known as:  RISPERDAL   tamsulosin 0.4 MG Caps capsule Commonly known as:  FLOMAX   traZODone 100 MG  tablet Commonly known as:  DESYREL     TAKE these medications   acetaminophen 325 MG tablet Commonly known as:  TYLENOL Take 2 tablets (650 mg total) by mouth every 6 (six) hours as needed for mild pain (or Fever >/= 101).   asenapine 5 MG Subl 24 hr tablet Commonly known as:  SAPHRIS Place 1 tablet (5 mg total) under the tongue every 12 (twelve) hours as needed (for agitation).   benztropine 1 MG tablet Commonly known as:  COGENTIN Take 1 mg by mouth 2 (two) times daily.   carbamazepine 200 MG tablet Commonly known as:  TEGRETOL Take 1 tablet (200 mg total)  by mouth 2 (two) times daily after a meal.   haloperidol 5 MG tablet Commonly known as:  HALDOL Take 1 tablet (5 mg total) by mouth 2 (two) times daily. What changed:  how much to take  when to take this   HYDROcodone-acetaminophen 10-325 MG tablet Commonly known as:  NORCO Take 1-2 tablets by mouth every 4 (four) hours as needed for moderate pain. Maximum dose per 24 hours -8 pills.   lamoTRIgine 100 MG tablet Commonly known as:  LAMICTAL Take 100 mg by mouth 2 (two) times daily. What changed:  Another medication with the same name was removed. Continue taking this medication, and follow the directions you see here.   omeprazole 20 MG capsule Commonly known as:  PRILOSEC Take 20 mg by mouth every morning.   phenazopyridine 200 MG tablet Commonly known as:  PYRIDIUM Take 1 tablet (200 mg total) by mouth 3 (three) times daily as needed for pain.   potassium chloride 10 MEQ tablet Commonly known as:  K-DUR,KLOR-CON Take 10 mEq by mouth every morning.      Follow-up Information    [facility] On 06/01/2016.   Why:  For your appiontment at 10:45 Contact information: [address]  [place] [address] [place] [id number] [phone]          No Known Allergies   Procedures/Studies: 3/9 TURP- Dr Vernie Ammonsttelin  Ct Head Wo Contrast  Result Date: 05/01/2016 CLINICAL DATA:  Patient is agitated with  delirium. History of schizophrenia and mental retardation. EXAM: CT HEAD WITHOUT CONTRAST TECHNIQUE: Contiguous axial images were obtained from the base of the skull through the vertex without intravenous contrast. COMPARISON:  04/27/2016. FINDINGS: Brain: No evidence of acute infarction, hemorrhage, hydrocephalus, extra-axial collection or mass lesion/mass effect. Vascular: No hyperdense vessel or unexpected calcification. Skull: Normal. Negative for fracture or focal lesion. Sinuses/Orbits: Globes orbits are unremarkable. Mild right maxillary sinus mucosal thickening. Sinuses otherwise essentially clear. Clear mastoid air cells. Other: None. IMPRESSION: 1. No acute intracranial abnormalities. No change from the prior head CT. Electronically Signed   By: Amie Portlandavid  Ormond M.D.   On: 05/01/2016 19:55   Ct Head Wo Contrast  Result Date: 04/27/2016 CLINICAL DATA:  Initial evaluation for balance issues. EXAM: CT HEAD WITHOUT CONTRAST TECHNIQUE: Contiguous axial images were obtained from the base of the skull through the vertex without intravenous contrast. COMPARISON:  Prior CT 10/22/2003. FINDINGS: Brain: Study limited by patient positioning. Atrophy with mild chronic small vessel ischemic disease. No acute intracranial hemorrhage. No evidence for acute large vessel territory infarct. No mass lesion, midline shift or mass effect. No hydrocephalus. No extra-axial fluid collection. Vascular: No hyperdense vessel. Skull: Scalp soft tissues demonstrate no acute abnormality. Calvarium intact. Sinuses/Orbits: Globes and orbital soft tissues within normal limits. Paranasal sinuses and mastoid air cells are clear. IMPRESSION: 1. No acute intracranial process. 2. Generalized age-related cerebral atrophy with mild chronic small vessel ischemic disease. Electronically Signed   By: Rise MuBenjamin  McClintock M.D.   On: 04/27/2016 22:36   Ct Abdomen W Contrast  Result Date: 05/03/2016 CLINICAL DATA:  Urinary retention and hematuria  after pulling out a Foley catheter. EXAM: CT ABDOMEN WITH CONTRAST TECHNIQUE: Multidetector CT imaging of the abdomen was performed using the standard protocol following bolus administration of intravenous contrast. CONTRAST:  100mL ISOVUE-300 IOPAMIDOL (ISOVUE-300) INJECTION 61% COMPARISON:  CT scan from 2012. FINDINGS: Lower chest: The lung bases demonstrate bilateral effusions and bibasilar infiltrates. The heart is enlarged but stable. There is tortuosity  and mild ectasia of the thoracic aorta. Hepatobiliary: Diffuse fatty infiltration of the liver but no focal hepatic lesions. The gallbladder is grossly normal. Mild common bile duct dilatation without obvious cause. Maximum diameter is 8 mm. Recommend correlation with liver function studies. MRCP would not be an option for this patient. Pancreas: Grossly normal. Spleen: Grossly normal. Adrenals/Urinary Tract: The adrenal glands and kidneys are grossly normal. No hydronephrosis. Small renal cysts. Stomach/Bowel: Grossly normal.  Moderate stool in the colon. Vascular/Lymphatic: The aorta and branch vessels are patent. 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 Possible web or focal dissection in the left iliac artery but it is not completely covered. Other: No ascites. Musculoskeletal: No significant bony findings. IMPRESSION: Limited examination due to patient motion. Small bilateral pleural effusions and bibasilar infiltrates. Mild common bile duct and cystic duct dilatation without obvious cause. The common bile duct tapers normally to the duodenum. Recommend correlation with liver function studies. Patient would not be a candidate for MRCP. No acute abdominal findings, mass lesions or adenopathy. Electronically Signed   By: Rudie Meyer M.D.   On: 05/03/2016 15:17   Dg Chest Port 1 View  Result Date: 05/02/2016 CLINICAL DATA:  Sepsis EXAM: PORTABLE CHEST 1 VIEW COMPARISON:  Chest radiograph 06/17/2011 FINDINGS:  Cardiac silhouette is enlarged mildly. There is no focal airspace consolidation or pulmonary edema. No pneumothorax or sizable pleural effusion. IMPRESSION: No focal airspace disease. Electronically Signed   By: Deatra Robinson M.D.   On: 05/02/2016 04:48   Dg Hand Complete Left  Result Date: 05/01/2016 CLINICAL DATA:  LT hand injury x 3 days ago with swelling on today. Caregiver states pt recently had a fit and broke several items including a glass. Pt has HX: Mental Retardation and Schizophrenia. Pt was combative prior to xray and was sedated in order to get xrays. 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 EXAM: LEFT HAND - COMPLETE 3+ VIEW COMPARISON:  None. FINDINGS: No fracture or dislocation. 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 There is narrowing of the radiocarpal joint between the radius and scaphoid, with mild subchondral sclerosis. Soft tissue swelling is noted most evident dorsally. No soft tissue air or radiopaque foreign body. IMPRESSION: 1. No acute fracture.  No dislocation. 2. 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 3. Nonspecific soft tissue swelling most evident dorsally. Electronically Signed   By: Amie Portland M.D.   On: 05/01/2016 18:14        Discharge Exam: Vitals:   05/18/16 2057 05/19/16 1424  BP: 134/83 (!) 162/94  Pulse: (!) 123   Resp: 18   Temp:     Vitals:   05/18/16 1543 05/18/16 1742 05/18/16 2057 05/19/16 1424  BP: (!) 112/58 (!) 151/87 134/83 (!) 162/94  Pulse: (!) 58 85 (!) 123   Resp: 18  18   Temp:      TempSrc:      SpO2:   92%   Weight:      Height:        General: Pt is alert, awake, not in acute distress Cardiovascular: RRR, S1/S2 +, no rubs, no gallops Respiratory: CTA bilaterally, no wheezing, no rhonchi Abdominal: Soft, NT, ND, bowel sounds + Extremities: no edema, no cyanosis    The results  of significant diagnostics from this hospitalization (including imaging, microbiology, ancillary and laboratory) are listed below for reference.  Microbiology: Recent Results (from the past 240 hour(s))  Culture, blood (routine x 2)     Status: None   Collection Time: 05/10/16 11:00 PM  Result Value Ref Range Status   Specimen Description BLOOD LEFT ARM  Final   Special Requests IN PEDIATRIC BOTTLE 1.5 CC  Final   Culture   Final    NO GROWTH 5 DAYS Performed at [facility], [address] [place], [place] [address]    Report Status 05/16/2016 FINAL  Final  Culture, blood (routine x 2)     Status: Abnormal   Collection Time: 05/10/16 11:00 PM  Result Value Ref Range Status   Specimen Description BLOOD RIGHT FOREARM  Final   Special Requests IN PEDIATRIC BOTTLE 3 CC  Final   Culture  Setup Time   Final    GRAM POSITIVE COCCI IN CLUSTERS IN PEDIATRIC BOTTLE CRITICAL RESULT CALLED TO, READ BACK BY AND VERIFIED WITH: T. GREEN, PHARM AT [facility], 05/12/16 AT 1738 BY J FUDESCO    Culture (A)  Final    STAPHYLOCOCCUS SPECIES (COAGULASE NEGATIVE) THE SIGNIFICANCE OF ISOLATING THIS ORGANISM FROM A SINGLE SET OF BLOOD CULTURES WHEN MULTIPLE SETS ARE DRAWN IS UNCERTAIN. PLEASE NOTIFY THE MICROBIOLOGY DEPARTMENT WITHIN ONE WEEK IF SPECIATION AND SENSITIVITIES ARE REQUIRED. Performed at [facility], [address], [place], [place] [address]    Report Status 05/14/2016 FINAL  Final  Blood Culture ID Panel (Reflexed)     Status: None   Collection Time: 05/10/16 11:00 PM  Result Value Ref Range Status   Enterococcus species NOT DETECTED NOT DETECTED Final   Listeria monocytogenes NOT DETECTED NOT DETECTED Final   Staphylococcus species NOT DETECTED NOT DETECTED Final   Staphylococcus aureus NOT DETECTED NOT DETECTED Final   Streptococcus species NOT DETECTED NOT DETECTED Final   Streptococcus agalactiae NOT DETECTED NOT DETECTED Final   Streptococcus pneumoniae NOT DETECTED NOT  DETECTED Final   Streptococcus pyogenes NOT DETECTED NOT DETECTED Final   Acinetobacter baumannii NOT DETECTED NOT DETECTED Final   Enterobacteriaceae species NOT DETECTED NOT DETECTED Final   Enterobacter cloacae complex NOT DETECTED NOT DETECTED Final   Escherichia coli NOT DETECTED NOT DETECTED Final   Klebsiella oxytoca NOT DETECTED NOT DETECTED Final   Klebsiella pneumoniae NOT DETECTED NOT DETECTED Final   Proteus species NOT DETECTED NOT DETECTED Final   Serratia marcescens NOT DETECTED NOT DETECTED Final   Haemophilus influenzae NOT DETECTED NOT DETECTED Final   Neisseria meningitidis NOT DETECTED NOT DETECTED Final   Pseudomonas aeruginosa NOT DETECTED NOT DETECTED Final   Candida albicans NOT DETECTED NOT DETECTED Final   Candida glabrata NOT DETECTED NOT DETECTED Final   Candida krusei NOT DETECTED NOT DETECTED Final   Candida parapsilosis NOT DETECTED NOT DETECTED Final   Candida tropicalis NOT DETECTED NOT DETECTED Final  Urine culture     Status: None   Collection Time: 05/15/16  9:39 AM  Result Value Ref Range Status   Specimen Description URINE, CATHETERIZED  Final   Special Requests Normal  Final   Culture   Final    NO GROWTH Performed at [facility], [address] [place], [place] [address]    Report Status 05/16/2016 FINAL  Final     Labs: BNP (last 3 results) No results for input(s): BNP in the last 8760 hours. Basic Metabolic Panel:  Recent Labs Lab 05/16/16 0604  NA 141  K 3.6  CL 106  CO2 29  GLUCOSE  80  BUN 19  CREATININE 1.05  CALCIUM 9.5   Liver Function Tests: No results for input(s): AST, ALT, ALKPHOS, BILITOT, PROT, ALBUMIN in the last 168 hours. No results for input(s): LIPASE, AMYLASE in the last 168 hours. No results for input(s): AMMONIA in the last 168 hours. CBC:  Recent Labs Lab 05/16/16 0604  WBC 5.3  HGB 10.4*  HCT 29.7*  MCV 93.1  PLT 234   Cardiac Enzymes: No results for input(s): CKTOTAL, CKMB,  CKMBINDEX, TROPONINI in the last 168 hours. BNP: Invalid input(s): POCBNP CBG: No results for input(s): GLUCAP in the last 168 hours. D-Dimer No results for input(s): DDIMER in the last 72 hours. Hgb A1c No results for input(s): HGBA1C in the last 72 hours. Lipid Profile No results for input(s): CHOL, HDL, LDLCALC, TRIG, CHOLHDL, LDLDIRECT in the last 72 hours. Thyroid function studies No results for input(s): TSH, T4TOTAL, T3FREE, THYROIDAB in the last 72 hours.  Invalid input(s): FREET3 Anemia work up No results for input(s): VITAMINB12, FOLATE, FERRITIN, TIBC, IRON, RETICCTPCT in the last 72 hours. Urinalysis    Component Value Date/Time   COLORURINE YELLOW 05/15/2016 0939   APPEARANCEUR CLOUDY (A) 05/15/2016 0939   LABSPEC 1.010 05/15/2016 0939   PHURINE 7.5 05/15/2016 0939   GLUCOSEU NEGATIVE 05/15/2016 0939   HGBUR LARGE (A) 05/15/2016 0939   BILIRUBINUR NEGATIVE 05/15/2016 0939   KETONESUR NEGATIVE 05/15/2016 0939   PROTEINUR 30 (A) 05/15/2016 0939   UROBILINOGEN 1.0 01/16/2012 1957   NITRITE NEGATIVE 05/15/2016 0939   LEUKOCYTESUR TRACE (A) 05/15/2016 0939   Sepsis Labs Invalid input(s): PROCALCITONIN,  WBC,  LACTICIDVEN Microbiology Recent Results (from the past 240 hour(s))  Culture, blood (routine x 2)     Status: None   Collection Time: 05/10/16 11:00 PM  Result Value Ref Range Status   Specimen Description BLOOD LEFT ARM  Final   Special Requests IN PEDIATRIC BOTTLE 1.5 CC  Final   Culture   Final    NO GROWTH 5 DAYS Performed at [facility], [address] [place], [place] [address]    Report Status 05/16/2016 FINAL  Final  Culture, blood (routine x 2)     Status: Abnormal   Collection Time: 05/10/16 11:00 PM  Result Value Ref Range Status   Specimen Description BLOOD RIGHT FOREARM  Final   Special Requests IN PEDIATRIC BOTTLE 3 CC  Final   Culture  Setup Time   Final    GRAM POSITIVE COCCI IN CLUSTERS IN PEDIATRIC BOTTLE CRITICAL RESULT  CALLED TO, READ BACK BY AND VERIFIED WITH: T. GREEN, PHARM AT [facility], 05/12/16 AT 1738 BY J FUDESCO    Culture (A)  Final    STAPHYLOCOCCUS SPECIES (COAGULASE NEGATIVE) THE SIGNIFICANCE OF ISOLATING THIS ORGANISM FROM A SINGLE SET OF BLOOD CULTURES WHEN MULTIPLE SETS ARE DRAWN IS UNCERTAIN. PLEASE NOTIFY THE MICROBIOLOGY DEPARTMENT WITHIN ONE WEEK IF SPECIATION AND SENSITIVITIES ARE REQUIRED. Performed at [facility], [address], [place], [place] [address]    Report Status 05/14/2016 FINAL  Final  Blood Culture ID Panel (Reflexed)     Status: None   Collection Time: 05/10/16 11:00 PM  Result Value Ref Range Status   Enterococcus species NOT DETECTED NOT DETECTED Final   Listeria monocytogenes NOT DETECTED NOT DETECTED Final   Staphylococcus species NOT DETECTED NOT DETECTED Final   Staphylococcus aureus NOT DETECTED NOT DETECTED Final   Streptococcus species NOT DETECTED NOT DETECTED Final   Streptococcus agalactiae NOT DETECTED NOT  DETECTED Final   Streptococcus pneumoniae NOT DETECTED NOT DETECTED Final   Streptococcus pyogenes NOT DETECTED NOT DETECTED Final   Acinetobacter baumannii NOT DETECTED NOT DETECTED Final   Enterobacteriaceae species NOT DETECTED NOT DETECTED Final   Enterobacter cloacae complex NOT DETECTED NOT DETECTED Final   Escherichia coli NOT DETECTED NOT DETECTED Final   Klebsiella oxytoca NOT DETECTED NOT DETECTED Final   Klebsiella pneumoniae NOT DETECTED NOT DETECTED Final   Proteus species NOT DETECTED NOT DETECTED Final   Serratia marcescens NOT DETECTED NOT DETECTED Final   Haemophilus influenzae NOT DETECTED NOT DETECTED Final   Neisseria meningitidis NOT DETECTED NOT DETECTED Final   Pseudomonas aeruginosa NOT DETECTED NOT DETECTED Final   Candida albicans NOT DETECTED NOT DETECTED Final   Candida glabrata NOT DETECTED NOT DETECTED Final   Candida krusei NOT DETECTED NOT DETECTED Final   Candida parapsilosis NOT DETECTED NOT DETECTED Final    Candida tropicalis NOT DETECTED NOT DETECTED Final  Urine culture     Status: None   Collection Time: 05/15/16  9:39 AM  Result Value Ref Range Status   Specimen Description URINE, CATHETERIZED  Final   Special Requests Normal  Final   Culture   Final    NO GROWTH Performed at [facility], [address], [place], [place] [address]    Report Status 05/16/2016 FINAL  Final     Time coordinating discharge: Over 30 minutes  SIGNED:   Calvert Cantor, MD  [facility] 05/19/2016, 2:29 PM Pager   If 7PM-7AM, please contact night-coverage [email or web address] Password [email or web address]

## 2016-05-20 MED ORDER — LACTATED RINGERS IV SOLN
INTRAVENOUS | Status: DC
  Administered 2016-05-20 – 2016-05-24 (×11): via INTRAVENOUS

## 2016-05-20 MED ORDER — LACTATED RINGERS IV BOLUS (SEPSIS)
500.0000 mL | Freq: Once | INTRAVENOUS | Status: AC
  Administered 2016-05-20: 500 mL via INTRAVENOUS

## 2016-05-20 NOTE — Progress Notes (Signed)
Unable to obtain patients temp. Orally or axillary. Rectal temperature 94.4 degrees F. Pt also very lethargic. Refused to take medications. Merdis DelayK. Schorr NP notified. Instructed this RN to pack patient with multiple warm blankets and turn heat up in room. This was done. Will continue to monitor patient closely.

## 2016-05-20 NOTE — Consult Note (Signed)
Urology Consult Note   Requesting Attending Physician:  Antonio CantorSaima Rizwan, MD Service Providing Consult: Urology Consulting Attending: Ottelin  Assessment:  Patient is a 66 y.o. male with mental retardation and schizophrenia with one episode of acute urinary retention 5 years ago presented with agitation and acute urinary retention requiring Foley catheter placement.  He has failed 2 trials of voids during this hospitalization per report.  Creatinine is normal at 1.1, his upper urinary tract appears normal on CT scan. Now s/p cysto with TURP 05/18/16 with Dr. Vernie Montoya.  Interval: AFVSS. Failed TOV yesterday - required I/O catheter x1 for 475cc and then Foley replaced overnight at 4:25am but only with 150 out on placement. Per report there was trouble placing a 16Fr catheter, but 14 Fr went in [place]. Flushed by urology this morning, appropriate position and flushing fine with no concern for gross hematuria or clot. No new labs.   Recommendations: 1. Continue Foley catheter today. Can irrigate PRN although suspect that this will not be necessary 2. We have placed the patient back on LR 125/hr with 500cc bolus this morning. Query if his failed TOV yesterday was actually secondary to under-resuscitation postop (despite 2L NS bolus yesterday throughout day), Foley replaced last evening with only 150cc on placement and had made 425 for the entire day prior to that. Will be beneficial to hydrate patient today 3. Repeat TOV tomorrow morning after resuscitation 4. Continue flomax  Thank you for this consult. Please contact the urology consult pager with any further questions/concerns. Antonio MulletMatthew Noach Calvillo, MD Urology Surgical Resident  Subjective Unable to provide history. Unaccompanied. In NAD although somewhat agitated with flushing of catheter.  Objective   Vital signs in last 24 hours: BP (!) 171/90 (BP Location: Left Arm)   Pulse 65   Temp 97.4 F (36.3 C) (Axillary)   Resp 20   Ht 6' (1.829 m)   Wt 71  kg (156 lb 8.4 oz)   SpO2 100%   BMI 21.23 kg/m   Intake/Output last 3 shifts: I/O last 3 completed shifts: In: 2600 [Other:2600] Out: 2175 [Urine:2175]  Physical Exam General: NAD, nonverbal, in restraints HEENT: [place]/AT, EOMI, MMM Pulmonary: Normal work of breathing on RA Cardiovascular: Regular rate & rhythm, HDS, adequate peripheral perfusion Abdomen: soft, NTTP, nondistended, no suprapubic fullness or tenderness GU: 14Fr Foley catheter in place with dark cola colored urine in bag, easily flushes producing light salmon colored urine with no clot burden Extremities: warm and well perfused, no edema  Most Recent Labs: Lab Results  Component Value Date   WBC 5.3 05/16/2016   HGB 10.4 (L) 05/16/2016   HCT 29.7 (L) 05/16/2016   PLT 234 05/16/2016    Lab Results  Component Value Date   NA 141 05/16/2016   K 3.6 05/16/2016   CL 106 05/16/2016   CO2 29 05/16/2016   BUN 19 05/16/2016   CREATININE 1.05 05/16/2016   CALCIUM 9.5 05/16/2016   MG 2.4 05/02/2016    Lab Results  Component Value Date   ALKPHOS 113 05/08/2016   BILITOT 0.5 05/08/2016   PROT 6.6 05/08/2016   ALBUMIN 3.4 (L) 05/08/2016   ALT 43 05/08/2016   AST 45 (H) 05/08/2016    Lab Results  Component Value Date   INR 1.01 05/02/2016   APTT 30 05/02/2016     Urine Culture: 05/15/16 no growth   IMAGING:  2/22 CT with contrast personally reviewed - bilateral sm renal cysts

## 2016-05-20 NOTE — Progress Notes (Addendum)
PROGRESS NOTE    Antonio Montoya   JYN:[id number]  DOB: [birth year]  DOA: 05/01/2016 PCP: Shayne Alken, MD (Inactive)    Brief Narrative:  Antonio Montoya is a 66 y.o. male with DM diet controlled, mental retardation and schizophrenia had come to the ER 2/19 for urinary retention (bladder scan showed > 1 L urine and caretaker stated he had not urinated in 18 hrs). He had Foley catheter placed and sent back to group home.  Patient is unable to give history- per ER HPI, he came if in for hematuria after pulling on his foley cath. He has beem increasingly agitated for a few wks per caregiver. Caregiver stated pt recently had a fit and broke several items including a glass ~ 3 days prior to admission & had swelling to the left hand on the day of admission .  In the ER patient had to be sedated with Ativan. He was found to have hypothermia with temp of 95.7.  This is the 5th visit to the ER since Jan of this year.  1/21- stumbled, fell, abrasion to forehead 2/4- left subconjunctival hemorrhage 2/16- off balance- head CT ok  2/16- U retention  Subjective: Non-verbal-   Assessment & Plan:   Principal Problem:   Acute exacerbation of underlying psych issues with new hypothermia & Urinary retention  - his group home does not want him back -as the foley may be the cause of his increased agitation,  I asked RN to pull foley  - agitation did not improve- foley did have to be re-inserted due to retention and psych consulted for assistance - the following changed made thus far by psych:  decreased Haldol from 25 to 15 QHS and now Haldol 5 mg BID Added 1mg  Ativan QHS  now changed to 0.5 mg BID with 1mg  Q 6 PRN Lamictal decreased from 125 in AM and 150 in PM  to 100  BID Risperdal ( which was given routine and PRN at home) stopped-  Oxcarbazine stopped Tegretol 200 mg BID added  Cogentin decreased from 1mg  daily to 0.5 mg daily Saphris 5mg  Q12 PRN for agitation added  - hypothermia resolved,   transferred out of SDU   -last seen by psych on 2/26- on 2/28, I spoke with Dr Magdalen Spatz who states that the patient has a traumatic brain injury which is causing his behavioral issues and there are no further medication changes he can make for him -  per RNs, agitation has not resolved despite using the PRN medications ordered by psych. He bites, punches and kicks when taken out of restraints     Active Problems:  U retention/ AKI - initially presented to ER on 2/19- foley placed- started 0.8 mg of Flomax and sent back to Group home - voiding trial 2/21- we were hoping his agitation would resolve with removing foley as well but he failed trial and agitation did not improve - Second voiding trial given on 1/26- failed this one as well  -Urology has evaluated him and  decided to take him to the OR for cystoscopy and possible TURP - 3/9 - Cystoscopy and TURP performed by Dr Vernie Ammons - foley now out -  Did not urinate yesterday- 2 L of NS given yesterday-  I  and O cath done and eventually foley replaced- U output poor despite aggressive hydration yesterday-  Urology assisting with management- given a 500cc bolus and LR started this AM  Swelling of left hand - xray does not show fracture --initially suspected  to be cellulitis in setting of hypothermia-  given Clinda  in ER and then zosyn & Vanc - Vanc stopped 2/21 and Zosyn continued- hand swelling not improving with Zosyn therefore I doubt it is cellulitis and is likely a traumatic injury-  (based on H and P) -  hand is non tender to touch - stopped Zosyn 2/23 - swelling has resolved after stopping Zosyn    Hypothermia - no source of sepsis found for this- blood cultures negative, UA negative - no endocrine cause found- cortisol and thyroid functions are normal - suspect poor thermoregulation due to antipsychotics- have spoken with psych about this-  Temps have improved  Elevated LFTs -  CT abd shows no signs of infection- he has a mild CBD  dilatation- doubt this is significant - suspect LFTs are elevated due to psych medications/ hypothermia   Hypoglycemia - sugar 22 on 2/23- which was likely not accurate as he has had no other drops < 50- will stop checking  Bradycardia - present since admission - HR 50-60 when awake- drops to 30-40s when he sleeps- BP has been stable  Thrombocytopenia - noted on 2/16 and has progressed- medication related from Zosyn? - stopped Zosyn on 2/23- - following daily- improved to normal    Pressure injury of skin Pressure Injury 05/01/16 Stage II - left buttock- Partial thickness loss of dermis presenting as a shallow open ulcer with a red, pink wound bed without slough. irregular in size - white granulation tissue to area.     DVT prophylaxis: SCDs Code Status: Full code Family Communication:  Disposition Plan:   once foley is removed, hopefully can return to group home Consultants:   psych Procedures:    Antimicrobials:  Anti-infectives    Start     Dose/Rate Route Frequency Ordered Stop   05/18/16 1046  ciprofloxacin (CIPRO) IVPB 400 mg     400 mg 200 mL/hr over 60 Minutes Intravenous 60 min pre-op 05/18/16 1046 05/18/16 1235   05/16/16 0600  cefTRIAXone (ROCEPHIN) 1 g in dextrose 5 % 50 mL IVPB  Status:  Discontinued     1 g 100 mL/hr over 30 Minutes Intravenous Every 12 hours 05/16/16 0412 05/16/16 0443   05/16/16 0600  cefTRIAXone (ROCEPHIN) 1 g in dextrose 5 % 50 mL IVPB  Status:  Discontinued     1 g 100 mL/hr over 30 Minutes Intravenous Every 24 hours 05/16/16 0443 05/18/16 1316   05/02/16 2000  vancomycin (VANCOCIN) IVPB 750 mg/150 ml premix  Status:  Discontinued     750 mg 150 mL/hr over 60 Minutes Intravenous Every 12 hours 05/02/16 0643 05/03/16 0805   05/02/16 1400  piperacillin-tazobactam (ZOSYN) IVPB 3.375 g  Status:  Discontinued     3.375 g 12.5 mL/hr over 240 Minutes Intravenous Every 8 hours 05/02/16 0532 05/04/16 1125   05/02/16 0415   piperacillin-tazobactam (ZOSYN) IVPB 3.375 g     3.375 g 100 mL/hr over 30 Minutes Intravenous  Once 05/02/16 0407 05/02/16 0553   05/02/16 0415  vancomycin (VANCOCIN) IVPB 1000 mg/200 mL premix     1,000 mg 200 mL/hr over 60 Minutes Intravenous  Once 05/02/16 0407 05/02/16 0745   05/01/16 2200  clindamycin (CLEOCIN) IVPB 600 mg     600 mg 100 mL/hr over 30 Minutes Intravenous  Once 05/01/16 2148 05/02/16 0010       Objective: Vitals:   05/18/16 2057 05/19/16 1424 05/19/16 2234 05/20/16 0419  BP: 134/83 (!) 162/94 (!) 154/86 (!) 171/90  Pulse: (!) 123  73 65  Resp: 18  20 20   Temp:   97.4 F (36.3 C)   TempSrc:   Axillary   SpO2: 92%  90% 100%  Weight:      Height:        Intake/Output Summary (Last 24 hours) at 05/20/16 1407 Last data filed at 05/20/16 1229  Gross per 24 hour  Intake              120 ml  Output              775 ml  Net             -655 ml   Filed Weights   05/02/16 0640  Weight: 71 kg (156 lb 8.4 oz)    Examination: General exam: alert, nonverbal, does not follow commands HEENT: PERRLA, oral mucosa moist, no sclera icterus or thrush Respiratory system: Clear to auscultation. Respiratory effort normal. Cardiovascular system: S1 & S2 heard, RRR.  No murmurs  Gastrointestinal system: Abdomen soft, non-tender, nondistended. Normal bowel sound. No organomegaly Central nervous system: moves all extremities Extremities: No cyanosis, clubbing- swelling of left hand resolved Skin: No rashes or ulcers Psychiatry:  Intermittently agitated requiring sedation    Data Reviewed: I have personally reviewed following labs and imaging studies  CBC:  Recent Labs Lab 05/16/16 0604  WBC 5.3  HGB 10.4*  HCT 29.7*  MCV 93.1  PLT 234   Basic Metabolic Panel:  Recent Labs Lab 05/16/16 0604  NA 141  K 3.6  CL 106  CO2 29  GLUCOSE 80  BUN 19  CREATININE 1.05  CALCIUM 9.5   GFR: Estimated Creatinine Clearance: 71.4 mL/min (by C-G formula based on  SCr of 1.05 mg/dL). Liver Function Tests: No results for input(s): AST, ALT, ALKPHOS, BILITOT, PROT, ALBUMIN in the last 168 hours. No results for input(s): LIPASE, AMYLASE in the last 168 hours. No results for input(s): AMMONIA in the last 168 hours. Coagulation Profile: No results for input(s): INR, PROTIME in the last 168 hours. Cardiac Enzymes: No results for input(s): CKTOTAL, CKMB, CKMBINDEX, TROPONINI in the last 168 hours. BNP (last 3 results) No results for input(s): PROBNP in the last 8760 hours. HbA1C: No results for input(s): HGBA1C in the last 72 hours. CBG: No results for input(s): GLUCAP in the last 168 hours. Lipid Profile: No results for input(s): CHOL, HDL, LDLCALC, TRIG, CHOLHDL, LDLDIRECT in the last 72 hours. Thyroid Function Tests: No results for input(s): TSH, T4TOTAL, FREET4, T3FREE, THYROIDAB in the last 72 hours. Anemia Panel: No results for input(s): VITAMINB12, FOLATE, FERRITIN, TIBC, IRON, RETICCTPCT in the last 72 hours. Urine analysis:    Component Value Date/Time   COLORURINE YELLOW 05/15/2016 0939   APPEARANCEUR CLOUDY (A) 05/15/2016 0939   LABSPEC 1.010 05/15/2016 0939   PHURINE 7.5 05/15/2016 0939   GLUCOSEU NEGATIVE 05/15/2016 0939   HGBUR LARGE (A) 05/15/2016 0939   BILIRUBINUR NEGATIVE 05/15/2016 0939   KETONESUR NEGATIVE 05/15/2016 0939   PROTEINUR 30 (A) 05/15/2016 0939   UROBILINOGEN 1.0 01/16/2012 1957   NITRITE NEGATIVE 05/15/2016 0939   LEUKOCYTESUR TRACE (A) 05/15/2016 0939   Sepsis Labs: @LABRCNTIP (procalcitonin:4,lacticidven:4) ) Recent Results (from the past 240 hour(s))  Culture, blood (routine x 2)     Status: None   Collection Time: 05/10/16 11:00 PM  Result Value Ref Range Status   Specimen Description BLOOD LEFT ARM  Final   Special Requests IN PEDIATRIC BOTTLE 1.5 CC  Final   Culture  Final    NO GROWTH 5 DAYS Performed at [facility], [address], [place], [place] [address]    Report Status 05/16/2016  FINAL  Final  Culture, blood (routine x 2)     Status: Abnormal   Collection Time: 05/10/16 11:00 PM  Result Value Ref Range Status   Specimen Description BLOOD RIGHT FOREARM  Final   Special Requests IN PEDIATRIC BOTTLE 3 CC  Final   Culture  Setup Time   Final    GRAM POSITIVE COCCI IN CLUSTERS IN PEDIATRIC BOTTLE CRITICAL RESULT CALLED TO, READ BACK BY AND VERIFIED WITH: T. GREEN, PHARM AT [facility], 05/12/16 AT 1738 BY J FUDESCO    Culture (A)  Final    STAPHYLOCOCCUS SPECIES (COAGULASE NEGATIVE) THE SIGNIFICANCE OF ISOLATING THIS ORGANISM FROM A SINGLE SET OF BLOOD CULTURES WHEN MULTIPLE SETS ARE DRAWN IS UNCERTAIN. PLEASE NOTIFY THE MICROBIOLOGY DEPARTMENT WITHIN ONE WEEK IF SPECIATION AND SENSITIVITIES ARE REQUIRED. Performed at [facility], [address] [place], [place] [address]    Report Status 05/14/2016 FINAL  Final  Blood Culture ID Panel (Reflexed)     Status: None   Collection Time: 05/10/16 11:00 PM  Result Value Ref Range Status   Enterococcus species NOT DETECTED NOT DETECTED Final   Listeria monocytogenes NOT DETECTED NOT DETECTED Final   Staphylococcus species NOT DETECTED NOT DETECTED Final   Staphylococcus aureus NOT DETECTED NOT DETECTED Final   Streptococcus species NOT DETECTED NOT DETECTED Final   Streptococcus agalactiae NOT DETECTED NOT DETECTED Final   Streptococcus pneumoniae NOT DETECTED NOT DETECTED Final   Streptococcus pyogenes NOT DETECTED NOT DETECTED Final   Acinetobacter baumannii NOT DETECTED NOT DETECTED Final   Enterobacteriaceae species NOT DETECTED NOT DETECTED Final   Enterobacter cloacae complex NOT DETECTED NOT DETECTED Final   Escherichia coli NOT DETECTED NOT DETECTED Final   Klebsiella oxytoca NOT DETECTED NOT DETECTED Final   Klebsiella pneumoniae NOT DETECTED NOT DETECTED Final   Proteus species NOT DETECTED NOT DETECTED Final   Serratia marcescens NOT DETECTED NOT DETECTED Final   Haemophilus influenzae NOT DETECTED NOT  DETECTED Final   Neisseria meningitidis NOT DETECTED NOT DETECTED Final   Pseudomonas aeruginosa NOT DETECTED NOT DETECTED Final   Candida albicans NOT DETECTED NOT DETECTED Final   Candida glabrata NOT DETECTED NOT DETECTED Final   Candida krusei NOT DETECTED NOT DETECTED Final   Candida parapsilosis NOT DETECTED NOT DETECTED Final   Candida tropicalis NOT DETECTED NOT DETECTED Final  Urine culture     Status: None   Collection Time: 05/15/16  9:39 AM  Result Value Ref Range Status   Specimen Description URINE, CATHETERIZED  Final   Special Requests Normal  Final   Culture   Final    NO GROWTH Performed at [facility], [address] [place], [place] [address]    Report Status 05/16/2016 FINAL  Final         Radiology Studies: No results found.    Scheduled Meds: . benztropine  0.5 mg Oral Daily  . carbamazepine  200 mg Oral BID PC  . feeding supplement (ENSURE ENLIVE)  237 mL Oral BID BM  . haloperidol  5 mg Oral BID  . lamoTRIgine  100 mg Oral BID  . pantoprazole  40 mg Oral Daily  . polycarbophil  625 mg Oral q morning - 10a  . potassium chloride  10 mEq Oral q morning - 10a  . tamsulosin  0.4 mg  Oral Daily   Continuous Infusions: . lactated ringers       LOS: 18 days    Time spent in minutes: 35    Brendalee Matthies, MD [facility] Pager: [email or web address] Password [facility] 05/20/2016, 2:07 PM

## 2016-05-21 LAB — CBC
HCT: 29.9 % — ABNORMAL LOW (ref 39.0–52.0)
Hemoglobin: 10.5 g/dL — ABNORMAL LOW (ref 13.0–17.0)
MCH: 33 pg (ref 26.0–34.0)
MCHC: 35.1 g/dL (ref 30.0–36.0)
MCV: 94 fL (ref 78.0–100.0)
PLATELETS: 227 10*3/uL (ref 150–400)
RBC: 3.18 MIL/uL — AB (ref 4.22–5.81)
RDW: 14.9 % (ref 11.5–15.5)
WBC: 6 10*3/uL (ref 4.0–10.5)

## 2016-05-21 LAB — BASIC METABOLIC PANEL
Anion gap: 5 (ref 5–15)
BUN: 20 mg/dL (ref 6–20)
CALCIUM: 9.6 mg/dL (ref 8.9–10.3)
CHLORIDE: 107 mmol/L (ref 101–111)
CO2: 30 mmol/L (ref 22–32)
CREATININE: 1.31 mg/dL — AB (ref 0.61–1.24)
GFR calc non Af Amer: 56 mL/min — ABNORMAL LOW (ref >60)
GLUCOSE: 109 mg/dL — AB (ref 65–99)
Potassium: 3.8 mmol/L (ref 3.5–5.1)
Sodium: 142 mmol/L (ref 135–145)

## 2016-05-21 LAB — MAGNESIUM: MAGNESIUM: 1.8 mg/dL (ref 1.7–2.4)

## 2016-05-21 NOTE — Progress Notes (Signed)
PROGRESS NOTE    Antonio Montoya   JXB:[id number]  DOB: [birth year]  DOA: 05/01/2016 PCP: Shayne AlkenEGE,HILMI, MD (Inactive)    Brief Narrative:  Antonio Montoya is a 66 y.o. male with DM diet controlled, mental retardation and schizophrenia had come to the ER 2/19 for urinary retention (bladder scan showed > 1 L urine and caretaker stated he had not urinated in 18 hrs). He had Foley catheter placed and sent back to group home.  Patient is unable to give history- per ER HPI, he came if in for hematuria after pulling on his foley cath. He has beem increasingly agitated for a few wks per caregiver. Caregiver stated pt recently had a fit and broke several items including a glass ~ 3 days prior to admission & had swelling to the left hand on the day of admission .  In the ER patient had to be sedated with Ativan. He was found to have hypothermia with temp of 95.7.  This is the 5th visit to the ER since Jan of this year.  1/21- stumbled, fell, abrasion to forehead 2/4- left subconjunctival hemorrhage 2/16- off balance- head CT ok  2/16- U retention  Subjective: Non-verbal-   Assessment & Plan:   Principal Problem:   Acute exacerbation of underlying psych issues with new hypothermia & Urinary retention  - his group home does not want him back -as the foley may be the cause of his increased agitation,  I asked RN to pull foley  - agitation did not improve- foley did have to be re-inserted due to retention and psych consulted for assistance - the following changed made thus far by psych:  decreased Haldol from 25 to 15 QHS and now Haldol 5 mg BID Added 1mg  Ativan QHS  now changed to 0.5 mg BID with 1mg  Q 6 PRN Lamictal decreased from 125 in AM and 150 in PM  to 100  BID Risperdal ( which was given routine and PRN at home) stopped-  Oxcarbazine stopped Tegretol 200 mg BID added  Cogentin decreased from 1mg  daily to 0.5 mg daily Saphris 5mg  Q12 PRN for agitation added  - hypothermia resolved,   transferred out of SDU   -last seen by psych on 2/26- on 2/28, I spoke with Dr Magdalen SpatzJonalaggada who states that the patient has a traumatic brain injury which is causing his behavioral issues and there are no further medication changes he can make for him -  per RNs, agitation has not resolved despite using the PRN medications ordered by psych. He bites, punches and kicks when taken out of restraints     Active Problems:  U retention/ AKI - initially presented to ER on 2/19- foley placed- started 0.8 mg of Flomax and sent back to Group home - voiding trial 2/21- we were hoping his agitation would resolve with removing foley as well but he failed trial and agitation did not improve - Second voiding trial given on 1/26- failed this one as well  -Urology has evaluated him and  decided to take him to the OR for cystoscopy and possible TURP - 3/9 - Cystoscopy and TURP performed by Dr Vernie Ammonsttelin - foley now out -  3/11- Did not urinate yesterday- 2 L of NS given yesterday-  I  and O cath done and eventually foley replaced- U output poor despite aggressive hydration yesterday-  Urology assisting with management- given a 500cc bolus and LR given yesterday - 3/12- Dr Vernie Ammonsttelin has asked to remove foley- condom cath placed- follow  urine output  Swelling of left hand - xray does not show fracture --initially suspected to be cellulitis in setting of hypothermia-  given Clinda  in ER and then zosyn & Vanc - Vanc stopped 2/21 and Zosyn continued- hand swelling not improving with Zosyn therefore I doubt it is cellulitis and is likely a traumatic injury-  (based on H and P) -  hand is non tender to touch - stopped Zosyn 2/23 - swelling has resolved after stopping Zosyn    Hypothermia - no source of sepsis found for this- blood cultures negative, UA negative - no endocrine cause found- cortisol and thyroid functions are normal - suspect poor thermoregulation due to antipsychotics- have spoken with psych about this-   Temps have improved  Elevated LFTs -  CT abd shows no signs of infection- he has a mild CBD dilatation- doubt this is significant - suspect LFTs were elevated due to psych medications/ hypothermia   Hypoglycemia - sugar 22 on 2/23- which was likely not accurate as he has had no other drops < 50- will stop checking  Bradycardia - present since admission - HR 50-60 when awake- drops to 30-40s when he sleeps- BP has been stable  Thrombocytopenia - noted on 2/16 and has progressed- medication related from Zosyn? - stopped Zosyn on 2/23- - following daily- improved to normal    Pressure injury of skin Pressure Injury 05/01/16 Stage II - left buttock- Partial thickness loss of dermis presenting as a shallow open ulcer with a red, pink wound bed without slough. irregular in size - white granulation tissue to area.     DVT prophylaxis: SCDs Code Status: Full code Family Communication:  Disposition Plan:   once Urology issues resolve, hopefully can return to group home soon Consultants:   psych Procedures:    Antimicrobials:  Anti-infectives    Start     Dose/Rate Route Frequency Ordered Stop   05/18/16 1046  ciprofloxacin (CIPRO) IVPB 400 mg     400 mg 200 mL/hr over 60 Minutes Intravenous 60 min pre-op 05/18/16 1046 05/18/16 1235   05/16/16 0600  cefTRIAXone (ROCEPHIN) 1 g in dextrose 5 % 50 mL IVPB  Status:  Discontinued     1 g 100 mL/hr over 30 Minutes Intravenous Every 12 hours 05/16/16 0412 05/16/16 0443   05/16/16 0600  cefTRIAXone (ROCEPHIN) 1 g in dextrose 5 % 50 mL IVPB  Status:  Discontinued     1 g 100 mL/hr over 30 Minutes Intravenous Every 24 hours 05/16/16 0443 05/18/16 1316   05/02/16 2000  vancomycin (VANCOCIN) IVPB 750 mg/150 ml premix  Status:  Discontinued     750 mg 150 mL/hr over 60 Minutes Intravenous Every 12 hours 05/02/16 0643 05/03/16 0805   05/02/16 1400  piperacillin-tazobactam (ZOSYN) IVPB 3.375 g  Status:  Discontinued     3.375 g 12.5 mL/hr over  240 Minutes Intravenous Every 8 hours 05/02/16 0532 05/04/16 1125   05/02/16 0415  piperacillin-tazobactam (ZOSYN) IVPB 3.375 g     3.375 g 100 mL/hr over 30 Minutes Intravenous  Once 05/02/16 0407 05/02/16 0553   05/02/16 0415  vancomycin (VANCOCIN) IVPB 1000 mg/200 mL premix     1,000 mg 200 mL/hr over 60 Minutes Intravenous  Once 05/02/16 0407 05/02/16 0745   05/01/16 2200  clindamycin (CLEOCIN) IVPB 600 mg     600 mg 100 mL/hr over 30 Minutes Intravenous  Once 05/01/16 2148 05/02/16 0010       Objective: Vitals:   05/20/16  2202 05/20/16 2321 05/21/16 0025 05/21/16 0421  BP:    106/70  Pulse:    79  Resp:  14  16  Temp: (!) 94.4 F (34.7 C) (!) 95.4 F (35.2 C) (!) 96.4 F (35.8 C) 97.2 F (36.2 C)  TempSrc: Rectal Rectal Rectal Rectal  SpO2:    98%  Weight:      Height:        Intake/Output Summary (Last 24 hours) at 05/21/16 1229 Last data filed at 05/21/16 0751  Gross per 24 hour  Intake          2496.25 ml  Output             1400 ml  Net          1096.25 ml   Filed Weights   05/02/16 0640  Weight: 71 kg (156 lb 8.4 oz)    Examination: General exam: alert, nonverbal, does not follow commands HEENT: PERRLA, oral mucosa moist, no sclera icterus or thrush Respiratory system: Clear to auscultation. Respiratory effort normal. Cardiovascular system: S1 & S2 heard, RRR.  No murmurs  Gastrointestinal system: Abdomen soft, non-tender, nondistended. Normal bowel sound. No organomegaly Central nervous system: moves all extremities Extremities: No cyanosis, clubbing- swelling of left hand resolved Skin: No rashes or ulcers Psychiatry:  Intermittently agitated requiring sedation    Data Reviewed: I have personally reviewed following labs and imaging studies  CBC:  Recent Labs Lab 05/16/16 0604 05/21/16 0833  WBC 5.3 6.0  HGB 10.4* 10.5*  HCT 29.7* 29.9*  MCV 93.1 94.0  PLT 234 227   Basic Metabolic Panel:  Recent Labs Lab 05/16/16 0604  05/21/16 0833  NA 141 142  K 3.6 3.8  CL 106 107  CO2 29 30  GLUCOSE 80 109*  BUN 19 20  CREATININE 1.05 1.31*  CALCIUM 9.5 9.6  MG  --  1.8   GFR: Estimated Creatinine Clearance: 57.2 mL/min (by C-G formula based on SCr of 1.31 mg/dL (H)). Liver Function Tests: No results for input(s): AST, ALT, ALKPHOS, BILITOT, PROT, ALBUMIN in the last 168 hours. No results for input(s): LIPASE, AMYLASE in the last 168 hours. No results for input(s): AMMONIA in the last 168 hours. Coagulation Profile: No results for input(s): INR, PROTIME in the last 168 hours. Cardiac Enzymes: No results for input(s): CKTOTAL, CKMB, CKMBINDEX, TROPONINI in the last 168 hours. BNP (last 3 results) No results for input(s): PROBNP in the last 8760 hours. HbA1C: No results for input(s): HGBA1C in the last 72 hours. CBG: No results for input(s): GLUCAP in the last 168 hours. Lipid Profile: No results for input(s): CHOL, HDL, LDLCALC, TRIG, CHOLHDL, LDLDIRECT in the last 72 hours. Thyroid Function Tests: No results for input(s): TSH, T4TOTAL, FREET4, T3FREE, THYROIDAB in the last 72 hours. Anemia Panel: No results for input(s): VITAMINB12, FOLATE, FERRITIN, TIBC, IRON, RETICCTPCT in the last 72 hours. Urine analysis:    Component Value Date/Time   COLORURINE YELLOW 05/15/2016 0939   APPEARANCEUR CLOUDY (A) 05/15/2016 0939   LABSPEC 1.010 05/15/2016 0939   PHURINE 7.5 05/15/2016 0939   GLUCOSEU NEGATIVE 05/15/2016 0939   HGBUR LARGE (A) 05/15/2016 0939   BILIRUBINUR NEGATIVE 05/15/2016 0939   KETONESUR NEGATIVE 05/15/2016 0939   PROTEINUR 30 (A) 05/15/2016 0939   UROBILINOGEN 1.0 01/16/2012 1957   NITRITE NEGATIVE 05/15/2016 0939   LEUKOCYTESUR TRACE (A) 05/15/2016 0939   Sepsis Labs: @LABRCNTIP (procalcitonin:4,lacticidven:4) ) Recent Results (from the past 240 hour(s))  Urine culture  Status: None   Collection Time: 05/15/16  9:39 AM  Result Value Ref Range Status   Specimen Description  URINE, CATHETERIZED  Final   Special Requests Normal  Final   Culture   Final    NO GROWTH Performed at [facility], [address] [place], [place] [address]    Report Status 05/16/2016 FINAL  Final         Radiology Studies: No results found.    Scheduled Meds: . benztropine  0.5 mg Oral Daily  . carbamazepine  200 mg Oral BID PC  . feeding supplement (ENSURE ENLIVE)  237 mL Oral BID BM  . haloperidol  5 mg Oral BID  . lamoTRIgine  100 mg Oral BID  . pantoprazole  40 mg Oral Daily  . polycarbophil  625 mg Oral q morning - 10a  . potassium chloride  10 mEq Oral q morning - 10a  . tamsulosin  0.4 mg Oral Daily   Continuous Infusions: . lactated ringers 125 mL/hr at 05/21/16 0928     LOS: 19 days    Time spent in minutes: 35    Elijah Phommachanh, MD [facility] Pager: [email or web address] Password [facility] 05/21/2016, 12:29 PM

## 2016-05-21 NOTE — Progress Notes (Signed)
Nutrition Follow-up  DOCUMENTATION CODES:   Not applicable  INTERVENTION:  - Recommend the downgrade of pt's diet to Dsyphagia 2 - Provide Magic cup BID - Continue Ensure Enlive BID - Continue meal assistance - RD to continue to monitor for additional nutritional needs and changes - If possible please obtain new weight of patient, last recorded weight was 2/21 of 156 lbs  NUTRITION DIAGNOSIS:   Biting/chewing difficulty related to other (see comment) (no teeth or dentures) as evidenced by other (see comment) (RN report).  Ongoing  GOAL:   Patient will meet greater than or equal to 90% of their needs  Unmet  MONITOR:   PO intake, Supplement acceptance, Weight trends, Labs, Skin, I & O's  ASSESSMENT:   66 y.o. male with PMH of mental retardation and schizophrenia presented to the ER for urinary retention and had Foley catheter placed and was sent back to group home. Pt found to be increasingly agitated. Pt also hurt his left hand in trying to hit something. Pt was brought back to the ER because of increasing difficulty in managing with agitation. In the ER pt had to be sedated with Ativan.   Pt lethargic and unable to communicate at time of visit.  Discussed pt with RN. Per RN report, pt enjoys magic cups. Pt's uneaten bagel and tray at bedside. Recommend downgrade of pt's diet d/t pt's lethargic state.   Per chart review, no new weights since admission recorded.  Per chart review, pt is consuming 0-80% of meals since last RD encounter. Per RN note pt has been spitting out food and drink. Pt has consumed 7 Ensure since last RD encounter.  Labs reviewed Medications reviewed; Protonix, Potassium chloride, Fibercon  Diet Order:  Diet regular Room service appropriate? Yes; Fluid consistency: Thin Diet - low sodium heart healthy  Skin:  Reviewed, no issues (Stage 2 R hip pressure injury)  Last BM:  3/3  Height:   Ht Readings from Last 1 Encounters:  05/02/16 6' (1.829  m)    Weight:   Wt Readings from Last 1 Encounters:  05/02/16 156 lb 8.4 oz (71 kg)    Ideal Body Weight:  80.91 kg  BMI:  Body mass index is 21.23 kg/m.  Estimated Nutritional Needs:   Kcal:  1630-1845 (23-26 kcal/kg)  Protein:  70-85 grams (1-1.2 grams/kg)  Fluid:  >/= 2 L/d  EDUCATION NEEDS:   No education needs identified at this time  Fransisca KaufmannAllison Ioannides Dietetic Intern

## 2016-05-21 NOTE — Progress Notes (Signed)
Patient have not been able to void since 5am after taking the foley out, bladder scan 385cc, Dr. Vernie Ammonsottelin notified and stated NOT to place foley yet, to give patient more time up to 6pm and will reevaluate. Will continue to assess patient.

## 2016-05-21 NOTE — Progress Notes (Signed)
Patient still unable to void, notified the on call MD, Dr. Retta Dionesahlstedt, order given to place [id number] coude cath.

## 2016-05-21 NOTE — Progress Notes (Signed)
Assessment: Acute on chronic urinary retention - he was found to have some prostatic obstruction at the time of his TURP.  He was completely resected and there is currently no obstruction to his bladder other than his external sphincter.  I note he was replaced on Flomax.  I suspect this is going to be of little benefit but will continue this for now. His catheter will be removed today.  I would not recommend replacing a catheter unless he appeared uncomfortable or more agitated and he was found to have a significant residual by ultrasound.  He otherwise may continue to carry a large residual but as long as he is urinating, even if incontinent, I feel this will be sufficient and the best that can be done.  Plan: 1.  D/C Foley catheter. 2.  Monitor urine output with condom catheter. 3.  Ultrasound PVR monitoring.  Contact me prior to placing another Foley catheter.    Subjective: Patient noncommunicative  Objective: Vital signs in last 24 hours: Temp:  [94.4 F (34.7 C)-97.2 F (36.2 C)] 97.2 F (36.2 C) (03/12 0421) Pulse Rate:  [71-79] 79 (03/12 0421) Resp:  [14-20] 16 (03/12 0421) BP: (106-182)/(70-92) 106/70 (03/12 0421) SpO2:  [97 %-100 %] 98 % (03/12 0421)A  Intake/Output from previous day: 03/11 0701 - 03/12 0700 In: 2376.3 [P.O.:220; I.V.:1656.3] Out: 1400 [Urine:1400] Intake/Output this shift: No intake/output data recorded.  Past Medical History:  Diagnosis Date  . MR (mental retardation)   . Schizophrenia [facility](HCC)     Physical Exam:  Lungs - Normal respiratory effort, chest expands symmetrically.  Abdomen - Soft, non-tender & non-distended. GU - Foley catheter indwelling and draining clear urine.  Lab Results: No results for input(s): WBC, HGB, HCT in the last 72 hours. BMET No results for input(s): NA, K, CL, CO2, GLUCOSE, BUN, CREATININE, CALCIUM in the last 72 hours. No results for input(s): LABURIN in the last 72 hours. Results for orders placed or performed  during the hospital encounter of 05/01/16  MRSA PCR Screening     Status: None   Collection Time: 05/02/16  3:08 AM  Result Value Ref Range Status   MRSA by PCR NEGATIVE NEGATIVE Final    Comment:        The GeneXpert MRSA Assay (FDA approved for NASAL specimens only), is one component of a comprehensive MRSA colonization surveillance program. It is not intended to diagnose MRSA infection nor to guide or monitor treatment for MRSA infections.   Culture, blood (x 2)     Status: None   Collection Time: 05/02/16  8:19 AM  Result Value Ref Range Status   Specimen Description BLOOD LEFT ARM  Final   Special Requests IN PEDIATRIC BOTTLE 1CC  Final   Culture   Final    NO GROWTH 5 DAYS Performed at [facility], [address] [place], [place] [address]    Report Status 05/07/2016 FINAL  Final  Culture, blood (x 2)     Status: None   Collection Time: 05/02/16  8:19 AM  Result Value Ref Range Status   Specimen Description BLOOD LEFT ARM  Final   Special Requests IN PEDIATRIC BOTTLE 2CC  Final   Culture   Final    NO GROWTH 5 DAYS Performed at [facility], [address] [place], [place] [address]    Report Status 05/07/2016 FINAL  Final  Culture, blood (routine x 2)     Status: None   Collection Time: 05/10/16 11:00  PM  Result Value Ref Range Status   Specimen Description BLOOD LEFT ARM  Final   Special Requests IN PEDIATRIC BOTTLE 1.5 CC  Final   Culture   Final    NO GROWTH 5 DAYS Performed at [facility], [address] [place], [place] [address]    Report Status 05/16/2016 FINAL  Final  Culture, blood (routine x 2)     Status: Abnormal   Collection Time: 05/10/16 11:00 PM  Result Value Ref Range Status   Specimen Description BLOOD RIGHT FOREARM  Final   Special Requests IN PEDIATRIC BOTTLE 3 CC  Final   Culture  Setup Time   Final    GRAM POSITIVE COCCI IN CLUSTERS IN PEDIATRIC BOTTLE CRITICAL RESULT CALLED TO, READ BACK BY AND VERIFIED  WITH: T. GREEN, PHARM AT [facility], 05/12/16 AT 1738 BY J FUDESCO    Culture (A)  Final    STAPHYLOCOCCUS SPECIES (COAGULASE NEGATIVE) THE SIGNIFICANCE OF ISOLATING THIS ORGANISM FROM A SINGLE SET OF BLOOD CULTURES WHEN MULTIPLE SETS ARE DRAWN IS UNCERTAIN. PLEASE NOTIFY THE MICROBIOLOGY DEPARTMENT WITHIN ONE WEEK IF SPECIATION AND SENSITIVITIES ARE REQUIRED. Performed at [facility], [address] [place], [place] [address]    Report Status 05/14/2016 FINAL  Final  Blood Culture ID Panel (Reflexed)     Status: None   Collection Time: 05/10/16 11:00 PM  Result Value Ref Range Status   Enterococcus species NOT DETECTED NOT DETECTED Final   Listeria monocytogenes NOT DETECTED NOT DETECTED Final   Staphylococcus species NOT DETECTED NOT DETECTED Final   Staphylococcus aureus NOT DETECTED NOT DETECTED Final   Streptococcus species NOT DETECTED NOT DETECTED Final   Streptococcus agalactiae NOT DETECTED NOT DETECTED Final   Streptococcus pneumoniae NOT DETECTED NOT DETECTED Final   Streptococcus pyogenes NOT DETECTED NOT DETECTED Final   Acinetobacter baumannii NOT DETECTED NOT DETECTED Final   Enterobacteriaceae species NOT DETECTED NOT DETECTED Final   Enterobacter cloacae complex NOT DETECTED NOT DETECTED Final   Escherichia coli NOT DETECTED NOT DETECTED Final   Klebsiella oxytoca NOT DETECTED NOT DETECTED Final   Klebsiella pneumoniae NOT DETECTED NOT DETECTED Final   Proteus species NOT DETECTED NOT DETECTED Final   Serratia marcescens NOT DETECTED NOT DETECTED Final   Haemophilus influenzae NOT DETECTED NOT DETECTED Final   Neisseria meningitidis NOT DETECTED NOT DETECTED Final   Pseudomonas aeruginosa NOT DETECTED NOT DETECTED Final   Candida albicans NOT DETECTED NOT DETECTED Final   Candida glabrata NOT DETECTED NOT DETECTED Final   Candida krusei NOT DETECTED NOT DETECTED Final   Candida parapsilosis NOT DETECTED NOT DETECTED Final   Candida tropicalis NOT DETECTED NOT  DETECTED Final  Urine culture     Status: None   Collection Time: 05/15/16  9:39 AM  Result Value Ref Range Status   Specimen Description URINE, CATHETERIZED  Final   Special Requests Normal  Final   Culture   Final    NO GROWTH Performed at [facility], [address] [place], [place] [address]    Report Status 05/16/2016 FINAL  Final    Studies/Results: No results found.    [place] 05/21/2016, 7:44 AM

## 2016-05-22 NOTE — Care Management Important Message (Signed)
Important Message  Patient Details  Name: Mart PiggsCharlie Zetino MRN: [id number] Date of Birth: [birth year]   Medicare Important Message Given:  Yes    Caren MacadamFuller, Lillee Mooneyhan 05/22/2016, 10:26 AMImportant Message  Patient Details  Name: Mart PiggsCharlie Mcmonigle MRN: [id number] Date of Birth: [birth year]   Medicare Important Message Given:  Yes    Caren MacadamFuller, Lataysha Vohra 05/22/2016, 10:25 AMImportant Message  Patient Details  Name: Mart PiggsCharlie Evitts MRN: [id number] Date of Birth: [birth year]   Medicare Important Message Given:  Yes    Caren MacadamFuller, Ebbie Cherry 05/22/2016, 10:25 AM

## 2016-05-22 NOTE — Progress Notes (Addendum)
PROGRESS NOTE    Antonio PiggsCharlie Mansfield   BJY:[id number]  DOB: Jun 12, 1950  DOA: 05/01/2016 PCP: Shayne AlkenEGE,HILMI, MD (Inactive)    Brief Narrative:  Antonio Montoya is a 66 y.o. male with DM diet controlled, mental retardation and schizophrenia had come to the ER 2/19 for urinary retention (bladder scan showed > 1 L urine and caretaker stated he had not urinated in 18 hrs). He had Foley catheter placed and sent back to group home.  Patient is unable to give history- per ER HPI, he came if in for hematuria after pulling on his foley cath. He has beem increasingly agitated for a few wks per caregiver. Caregiver stated pt recently had a fit and broke several items including a glass ~ 3 days prior to admission & had swelling to the left hand on the day of admission .  In the ER patient had to be sedated with Ativan. He was found to have hypothermia with temp of 95.7.  This is the 5th visit to the ER since Jan of this year.  1/21- stumbled, fell, abrasion to forehead 2/4- left subconjunctival hemorrhage 2/16- off balance- head CT ok  2/16- U retention  Subjective: Non-verbal-   Assessment & Plan:   Principal Problem:   Acute exacerbation of underlying psych issues with new hypothermia & Urinary retention  - his group home does not want him back -as the foley may be the cause of his increased agitation,  I asked RN to pull foley  - agitation did not improve- foley did have to be re-inserted due to retention and psych consulted for assistance - the following changed made thus far by psych:  decreased Haldol from 25 to 15 QHS and now Haldol 5 mg BID Added 1mg  Ativan QHS  now changed to 0.5 mg BID with 1mg  Q 6 PRN Lamictal decreased from 125 in AM and 150 in PM  to 100  BID Risperdal ( which was given routine and PRN at home) stopped-  Oxcarbazine stopped Tegretol 200 mg BID added  Cogentin decreased from 1mg  daily to 0.5 mg daily Saphris 5mg  Q12 PRN for agitation added  - hypothermia resolved,   transferred out of SDU   -last seen by psych on 2/26- on 2/28, I spoke with Dr Magdalen SpatzJonalaggada who states that the patient has a traumatic brain injury which is causing his behavioral issues and there are no further medication changes he can make for him -  per RNs, agitation has not resolved despite using the PRN medications ordered by psych. He bites, punches and kicks when taken out of restraints   -cont  PRN Saphris and Ativan and 4 point restraints-   Active Problems:  U retention/ AKI - initially presented to ER on 2/19- foley placed- started 0.8 mg of Flomax and sent back to Group home - voiding trial 2/21- we were hoping his agitation would resolve with removing foley as well but he failed trial and agitation did not improve - Second voiding trial given on 1/26- failed this one as well  - as it he is a high risk for pulling his foley and causing damage, urology evaluated him and decided to take him to the OR for cystoscopy and possible TURP - 3/9 - Cystoscopy and TURP performed by Dr Vernie Ammonsttelin - foley now out -  3/11- Did not urinate yesterday- 2 L of NS given yesterday-  I  and O cath done and eventually foley replaced- U output poor despite aggressive hydration yesterday-  Urology assisting  with management- given a 500cc bolus and LR given yesterday - 3/12- Dr Vernie Ammons has asked to remove foley- condom cath placed- follow urine output - 3/13- foley replaced last night due to continued retention- Dr Vernie Ammons has asked to remove again today - continue IVF - check renal function tomorrow  Swelling of left hand - xray does not show fracture --initially suspected to be cellulitis in setting of hypothermia-  given Clinda  in ER and then zosyn & Vanc - Vanc stopped 2/21 and Zosyn continued- hand swelling not improving with Zosyn therefore I doubt it is cellulitis and is likely a traumatic injury-  (based on H and P) -  hand is non tender to touch - stopped Zosyn 2/23 - swelling has resolved after  stopping Zosyn    Hypothermia - no source of sepsis found for this- blood cultures negative, UA negative - no endocrine cause found- cortisol and thyroid functions are normal - suspect poor thermoregulation due to antipsychotics- have spoken with psych about this-     Elevated LFTs -  CT abd shows no signs of infection- he has a mild CBD dilatation- doubt this is significant - suspect LFTs were elevated due to psych medications/ hypothermia   Hypoglycemia - sugar 22 on 2/23- which was likely not accurate as he has had no other drops < 50- will stop checking  Bradycardia - present since admission - HR 50-60 when awake- drops to 30-40s when he sleeps- BP has been stable  Thrombocytopenia - noted on 2/16 and has progressed- medication related from Zosyn? - stopped Zosyn on 2/23- - following daily- improved to normal    Pressure injury of skin Pressure Injury 05/01/16 Stage II - left buttock- Partial thickness loss of dermis presenting as a shallow open ulcer with a red, pink wound bed without slough. irregular in size - white granulation tissue to area.     DVT prophylaxis: SCDs Code Status: Full code Family Communication:  Disposition Plan:   once Urology issues resolve, hopefully can return to group home  Consultants:   psych Procedures:    Antimicrobials:  Anti-infectives    Start     Dose/Rate Route Frequency Ordered Stop   05/18/16 1046  ciprofloxacin (CIPRO) IVPB 400 mg     400 mg 200 mL/hr over 60 Minutes Intravenous 60 min pre-op 05/18/16 1046 05/18/16 1235   05/16/16 0600  cefTRIAXone (ROCEPHIN) 1 g in dextrose 5 % 50 mL IVPB  Status:  Discontinued     1 g 100 mL/hr over 30 Minutes Intravenous Every 12 hours 05/16/16 0412 05/16/16 0443   05/16/16 0600  cefTRIAXone (ROCEPHIN) 1 g in dextrose 5 % 50 mL IVPB  Status:  Discontinued     1 g 100 mL/hr over 30 Minutes Intravenous Every 24 hours 05/16/16 0443 05/18/16 1316   05/02/16 2000  vancomycin (VANCOCIN) IVPB 750  mg/150 ml premix  Status:  Discontinued     750 mg 150 mL/hr over 60 Minutes Intravenous Every 12 hours 05/02/16 0643 05/03/16 0805   05/02/16 1400  piperacillin-tazobactam (ZOSYN) IVPB 3.375 g  Status:  Discontinued     3.375 g 12.5 mL/hr over 240 Minutes Intravenous Every 8 hours 05/02/16 0532 05/04/16 1125   05/02/16 0415  piperacillin-tazobactam (ZOSYN) IVPB 3.375 g     3.375 g 100 mL/hr over 30 Minutes Intravenous  Once 05/02/16 0407 05/02/16 0553   05/02/16 0415  vancomycin (VANCOCIN) IVPB 1000 mg/200 mL premix     1,000 mg 200 mL/hr over  60 Minutes Intravenous  Once 05/02/16 0407 05/02/16 0745   05/01/16 2200  clindamycin (CLEOCIN) IVPB 600 mg     600 mg 100 mL/hr over 30 Minutes Intravenous  Once 05/01/16 2148 05/02/16 0010       Objective: Vitals:   05/21/16 0421 05/21/16 1442 05/21/16 2253 05/22/16 0425  BP: 106/70 (!) 145/69 (!) 141/89 (!) 151/96  Pulse: 79 83 (!) 51 66  Resp: 16 16 16 14   Temp: 97.2 F (36.2 C) 98.1 F (36.7 C) 98.1 F (36.7 C)   TempSrc: Rectal Axillary Oral   SpO2: 98% 99% 96% 100%  Weight:      Height:        Intake/Output Summary (Last 24 hours) at 05/22/16 1237 Last data filed at 05/22/16 0818  Gross per 24 hour  Intake             3490 ml  Output             1700 ml  Net             1790 ml   Filed Weights   05/02/16 0640  Weight: 71 kg (156 lb 8.4 oz)    Examination: General exam: alert, nonverbal, does not follow commands HEENT: PERRLA, oral mucosa moist, no sclera icterus or thrush Respiratory system: Clear to auscultation. Respiratory effort normal. Cardiovascular system: S1 & S2 heard, RRR.  No murmurs  Gastrointestinal system: Abdomen soft, non-tender, nondistended. Normal bowel sound. No organomegaly Central nervous system: moves all extremities Extremities: No cyanosis, clubbing-  Skin: No rashes or ulcers Psychiatry:  Intermittently agitated requiring sedation    Data Reviewed: I have personally reviewed following  labs and imaging studies  CBC:  Recent Labs Lab 05/16/16 0604 05/21/16 0833  WBC 5.3 6.0  HGB 10.4* 10.5*  HCT 29.7* 29.9*  MCV 93.1 94.0  PLT 234 227   Basic Metabolic Panel:  Recent Labs Lab 05/16/16 0604 05/21/16 0833  NA 141 142  K 3.6 3.8  CL 106 107  CO2 29 30  GLUCOSE 80 109*  BUN 19 20  CREATININE 1.05 1.31*  CALCIUM 9.5 9.6  MG  --  1.8   GFR: Estimated Creatinine Clearance: 57.2 mL/min (by C-G formula based on SCr of 1.31 mg/dL (H)). Liver Function Tests: No results for input(s): AST, ALT, ALKPHOS, BILITOT, PROT, ALBUMIN in the last 168 hours. No results for input(s): LIPASE, AMYLASE in the last 168 hours. No results for input(s): AMMONIA in the last 168 hours. Coagulation Profile: No results for input(s): INR, PROTIME in the last 168 hours. Cardiac Enzymes: No results for input(s): CKTOTAL, CKMB, CKMBINDEX, TROPONINI in the last 168 hours. BNP (last 3 results) No results for input(s): PROBNP in the last 8760 hours. HbA1C: No results for input(s): HGBA1C in the last 72 hours. CBG: No results for input(s): GLUCAP in the last 168 hours. Lipid Profile: No results for input(s): CHOL, HDL, LDLCALC, TRIG, CHOLHDL, LDLDIRECT in the last 72 hours. Thyroid Function Tests: No results for input(s): TSH, T4TOTAL, FREET4, T3FREE, THYROIDAB in the last 72 hours. Anemia Panel: No results for input(s): VITAMINB12, FOLATE, FERRITIN, TIBC, IRON, RETICCTPCT in the last 72 hours. Urine analysis:    Component Value Date/Time   COLORURINE YELLOW 05/15/2016 0939   APPEARANCEUR CLOUDY (A) 05/15/2016 0939   LABSPEC 1.010 05/15/2016 0939   PHURINE 7.5 05/15/2016 0939   GLUCOSEU NEGATIVE 05/15/2016 0939   HGBUR LARGE (A) 05/15/2016 0939   BILIRUBINUR NEGATIVE 05/15/2016 0939   KETONESUR NEGATIVE  05/15/2016 0939   PROTEINUR 30 (A) 05/15/2016 0939   UROBILINOGEN 1.0 01/16/2012 1957   NITRITE NEGATIVE 05/15/2016 0939   LEUKOCYTESUR TRACE (A) 05/15/2016 0939   Sepsis  Labs: @LABRCNTIP (procalcitonin:4,lacticidven:4) ) Recent Results (from the past 240 hour(s))  Urine culture     Status: None   Collection Time: 05/15/16  9:39 AM  Result Value Ref Range Status   Specimen Description URINE, CATHETERIZED  Final   Special Requests Normal  Final   Culture   Final    NO GROWTH Performed at [facility], [address] [place], [place] [address]    Report Status 05/16/2016 FINAL  Final         Radiology Studies: No results found.    Scheduled Meds: . benztropine  0.5 mg Oral Daily  . carbamazepine  200 mg Oral BID PC  . feeding supplement (ENSURE ENLIVE)  237 mL Oral BID BM  . haloperidol  5 mg Oral BID  . lamoTRIgine  100 mg Oral BID  . pantoprazole  40 mg Oral Daily  . polycarbophil  625 mg Oral q morning - 10a  . potassium chloride  10 mEq Oral q morning - 10a  . tamsulosin  0.4 mg Oral Daily   Continuous Infusions: . lactated ringers 125 mL/hr at 05/22/16 0832     LOS: 20 days    Time spent in minutes: 35    Broox Lonigro, MD [facility] Pager: [email or web address] Password [facility] 05/22/2016, 12:37 PM

## 2016-05-22 NOTE — Progress Notes (Signed)
Assessment: Acute on chronic urinary retention - Despite having undergone a TURP he was unable to void again yesterday and rather than wait to see if he developed overflow incontinence my partner was contacted and another Foley catheter was inserted.  At this point there really is no option for this patient other than to develop overflow incontinence if that is what is required.  The only indication for replacement of a Foley catheter despite large amounts noted in the bladder by ultrasound, would be if the patient was experiencing significant discomfort.  Otherwise he has no alternative than to develop overflow incontinence and manage this with a diaper with monitoring of his creatinine and upper tracts for hydronephrosis.  The only other option, since diversion of any form would not be an option for in his particular situation, would be to perform a external sphincterotomy rendering him completely incontinent.  While this could be performed it would be a last resort.  Plan:  1.  Remove Foley catheter. 2.  Monitor creatinine.    Subjective: Patient is noncommunicative  Objective: Vital signs in last 24 hours: Temp:  [98.1 F (36.7 C)] 98.1 F (36.7 C) (03/12 2253) Pulse Rate:  [51-83] 66 (03/13 0425) Resp:  [14-16] 14 (03/13 0425) BP: (141-151)/(69-96) 151/96 (03/13 0425) SpO2:  [96 %-100 %] 100 % (03/13 0425)A  Intake/Output from previous day: 03/12 0701 - 03/13 0700 In: 3730 [P.O.:730; I.V.:3000] Out: 1400 [Urine:1400] Intake/Output this shift: Total I/O In: -  Out: 300 [Urine:300]  Past Medical History:  Diagnosis Date  . MR (mental retardation)   . Schizophrenia [facility])     Physical Exam:  Lungs - Normal respiratory effort, chest expands symmetrically.  Abdomen - Soft, non-tender & non-distended.  Lab Results:  Recent Labs  05/21/16 0833  WBC 6.0  HGB 10.5*  HCT 29.9*   BMET  Recent Labs  05/21/16 0833  NA 142  K 3.8  CL 107  CO2 30  GLUCOSE 109*  BUN  20  CREATININE 1.31*  CALCIUM 9.6   No results for input(s): LABURIN in the last 72 hours. Results for orders placed or performed during the hospital encounter of 05/01/16  MRSA PCR Screening     Status: None   Collection Time: 05/02/16  3:08 AM  Result Value Ref Range Status   MRSA by PCR NEGATIVE NEGATIVE Final    Comment:        The GeneXpert MRSA Assay (FDA approved for NASAL specimens only), is one component of a comprehensive MRSA colonization surveillance program. It is not intended to diagnose MRSA infection nor to guide or monitor treatment for MRSA infections.   Culture, blood (x 2)     Status: None   Collection Time: 05/02/16  8:19 AM  Result Value Ref Range Status   Specimen Description BLOOD LEFT ARM  Final   Special Requests IN PEDIATRIC BOTTLE 1CC  Final   Culture   Final    NO GROWTH 5 DAYS Performed at [facility], [address] [place], [place] [address]    Report Status 05/07/2016 FINAL  Final  Culture, blood (x 2)     Status: None   Collection Time: 05/02/16  8:19 AM  Result Value Ref Range Status   Specimen Description BLOOD LEFT ARM  Final   Special Requests IN PEDIATRIC BOTTLE 2CC  Final   Culture   Final    NO GROWTH 5 DAYS Performed at [facility], [address] [place], [place]  [id number]    Report Status 05/07/2016 FINAL  Final  Culture, blood (routine x 2)     Status: None   Collection Time: 05/10/16 11:00 PM  Result Value Ref Range Status   Specimen Description BLOOD LEFT ARM  Final   Special Requests IN PEDIATRIC BOTTLE 1.5 CC  Final   Culture   Final    NO GROWTH 5 DAYS Performed at [facility], [address] [place], [place] [address]    Report Status 05/16/2016 FINAL  Final  Culture, blood (routine x 2)     Status: Abnormal   Collection Time: 05/10/16 11:00 PM  Result Value Ref Range Status   Specimen Description BLOOD RIGHT FOREARM  Final   Special Requests IN PEDIATRIC BOTTLE 3 CC  Final   Culture   Setup Time   Final    GRAM POSITIVE COCCI IN CLUSTERS IN PEDIATRIC BOTTLE CRITICAL RESULT CALLED TO, READ BACK BY AND VERIFIED WITH: T. GREEN, PHARM AT [facility], 05/12/16 AT 1738 BY J FUDESCO    Culture (A)  Final    STAPHYLOCOCCUS SPECIES (COAGULASE NEGATIVE) THE SIGNIFICANCE OF ISOLATING THIS ORGANISM FROM A SINGLE SET OF BLOOD CULTURES WHEN MULTIPLE SETS ARE DRAWN IS UNCERTAIN. PLEASE NOTIFY THE MICROBIOLOGY DEPARTMENT WITHIN ONE WEEK IF SPECIATION AND SENSITIVITIES ARE REQUIRED. Performed at [facility], [address] [place], [place] [address]    Report Status 05/14/2016 FINAL  Final  Blood Culture ID Panel (Reflexed)     Status: None   Collection Time: 05/10/16 11:00 PM  Result Value Ref Range Status   Enterococcus species NOT DETECTED NOT DETECTED Final   Listeria monocytogenes NOT DETECTED NOT DETECTED Final   Staphylococcus species NOT DETECTED NOT DETECTED Final   Staphylococcus aureus NOT DETECTED NOT DETECTED Final   Streptococcus species NOT DETECTED NOT DETECTED Final   Streptococcus agalactiae NOT DETECTED NOT DETECTED Final   Streptococcus pneumoniae NOT DETECTED NOT DETECTED Final   Streptococcus pyogenes NOT DETECTED NOT DETECTED Final   Acinetobacter baumannii NOT DETECTED NOT DETECTED Final   Enterobacteriaceae species NOT DETECTED NOT DETECTED Final   Enterobacter cloacae complex NOT DETECTED NOT DETECTED Final   Escherichia coli NOT DETECTED NOT DETECTED Final   Klebsiella oxytoca NOT DETECTED NOT DETECTED Final   Klebsiella pneumoniae NOT DETECTED NOT DETECTED Final   Proteus species NOT DETECTED NOT DETECTED Final   Serratia marcescens NOT DETECTED NOT DETECTED Final   Haemophilus influenzae NOT DETECTED NOT DETECTED Final   Neisseria meningitidis NOT DETECTED NOT DETECTED Final   Pseudomonas aeruginosa NOT DETECTED NOT DETECTED Final   Candida albicans NOT DETECTED NOT DETECTED Final   Candida glabrata NOT DETECTED NOT DETECTED Final   Candida krusei NOT  DETECTED NOT DETECTED Final   Candida parapsilosis NOT DETECTED NOT DETECTED Final   Candida tropicalis NOT DETECTED NOT DETECTED Final  Urine culture     Status: None   Collection Time: 05/15/16  9:39 AM  Result Value Ref Range Status   Specimen Description URINE, CATHETERIZED  Final   Special Requests Normal  Final   Culture   Final    NO GROWTH Performed at [facility], [address] [place], [place] [address]    Report Status 05/16/2016 FINAL  Final    Studies/Results: No results found.    Tashawna Thom C 05/22/2016, 10:10 AM

## 2016-05-22 NOTE — Progress Notes (Signed)
Dr. Vernie Ammonsttelin stated to remove foley and not replace it.

## 2016-05-23 LAB — BASIC METABOLIC PANEL
Anion gap: 8 (ref 5–15)
BUN: 18 mg/dL (ref 6–20)
CO2: 29 mmol/L (ref 22–32)
CREATININE: 1.14 mg/dL (ref 0.61–1.24)
Calcium: 9.9 mg/dL (ref 8.9–10.3)
Chloride: 104 mmol/L (ref 101–111)
GFR calc Af Amer: >60 mL/min (ref >60)
Glucose, Bld: 91 mg/dL (ref 65–99)
POTASSIUM: 3.9 mmol/L (ref 3.5–5.1)
SODIUM: 141 mmol/L (ref 135–145)

## 2016-05-23 NOTE — Progress Notes (Signed)
PROGRESS NOTE    Antonio Montoya   ZOX:[id number]  DOB: 01/11/1951  DOA: 05/01/2016 PCP: Shayne Alken, MD (Inactive)    Brief Narrative:  66 y.o. male with DM diet controlled, mental retardation and schizophrenia had come to the ER 2/19 for urinary retention (bladder scan showed > 1 L urine and caretaker stated he had not urinated in 18 hrs). He had Foley catheter placed and sent back to group home.  Patient is unable to give history- per ER HPI, he came if in for hematuria after pulling on his foley cath. He has beem increasingly agitated for a few wks per caregiver. Caregiver stated pt recently had a fit and broke several items including a glass ~ 3 days prior to admission & had swelling to the left hand on the day of admission .  In the ER patient had to be sedated with Ativan. He was found to have hypothermia with temp of 95.7.  This is the 5th visit to the ER since Jan of this year.  1/21- stumbled, fell, abrasion to forehead 2/4- left subconjunctival hemorrhage 2/16- off balance- head CT ok  2/16- U retention  Subjective:  Non-verbal In restraints Nursing reports has had no urination so far. Eating well    Assessment & Plan:   Principal Problem:   Acute exacerbation of underlying psych issues with new hypothermia & Urinary retention  - his group home does not want him back -as the foley may be the cause of his increased agitation,  I asked RN to pull foley  - agitation did not improve- foley did have to be re-inserted due to retention and psych consulted for assistance - the following changed made thus far by psych:  decreased Haldol from 25 to 15 QHS and now Haldol 5 mg BID Added 1mg  Ativan QHS  now changed to 0.5 mg BID with 1mg  Q 6 PRN Lamictal decreased from 125 in AM and 150 in PM  to 100  BID Risperdal ( which was given routine and PRN at home) stopped-  Oxcarbazine stopped Tegretol 200 mg BID added  Cogentin decreased from 1mg  daily to 0.5 mg daily Saphris 5mg  Q12  PRN for agitation added  - hypothermia resolved,  transferred out of SDU   -last seen by psych on 2/26- on 2/28, I spoke with Dr Magdalen Spatz who states that the patient has a traumatic brain injury which is causing his behavioral issues and there are no further medication changes he can make for him -  per RNs, agitation has not resolved despite using the PRN medications ordered by psych. He bites, punches and kicks when taken out of restraints--we will attempt to keep restraints off 3/14  -cont  PRN Saphris and Ativan and 4 point restraints-   Active Problems:  U retention/ AKI - initially presented to ER on 2/19- foley placed- started 0.8 mg of Flomax and sent back to Group home - voiding trial 2/21- we were hoping his agitation would resolve with removing foley as well but he failed trial and agitation did not improve - Second voiding trial given on 2/26- failed this one as well  - as it he is a high risk for pulling his foley and causing damage, urology evaluated him and decided to take him to the OR for cystoscopy and possible TURP - 3/9 - Cystoscopy and TURP performed by Dr Vernie Ammons - foley now out -  3/11- Did not urinate yesterday- 2 L of NS given yesterday-  I  and O  cath done and eventually foley replaced- U output poor despite aggressive hydration yesterday-  Urology assisting with management- given a 500cc bolus and LR given yesterday - 3/12- Dr Vernie Ammons has asked to remove foley- condom cath placed- follow urine output - 3/13- foley replaced last night due to continued retention- Dr Vernie Ammons has asked to remove again today - continue IVF - check renal function tomorrow -Keep foley out, and retaining 900 cc--Given order for I/o of bladder without indwelling foley until again seen by Urology  Swelling of left hand - xray does not show fracture --initially suspected to be cellulitis in setting of hypothermia-  given Clinda  in ER and then zosyn & Vanc - Vanc stopped 2/21 and Zosyn  continued- hand swelling not improving with Zosyn therefore I doubt it is cellulitis and is likely a traumatic injury-  (based on H and P) -  hand is non tender to touch - stopped Zosyn 2/23 - swelling has resolved after stopping Zosyn    Hypothermia - no source of sepsis found for this- blood cultures negative, UA negative - no endocrine cause found- cortisol and thyroid functions are normal - suspect poor thermoregulation due to antipsychotics- have spoken with psych about this-     Elevated LFTs -  CT abd shows no signs of infection- he has a mild CBD dilatation- doubt this is significant - suspect LFTs were elevated due to psych medications/ hypothermia   Hypoglycemia - sugar 22 on 2/23- which was likely not accurate as he has had no other drops < 50- will stop checking  Bradycardia - present since admission - HR 50-60 when awake- drops to 30-40s when he sleeps- BP has been stable  Thrombocytopenia - noted on 2/16 and has progressed- medication related from Zosyn? - stopped Zosyn on 2/23- - following daily- improved to normal    Pressure injury of skin Pressure Injury 05/01/16 Stage II - left buttock- Partial thickness loss of dermis presenting as a shallow open ulcer with a red, pink wound bed without slough. irregular in size - white granulation tissue to area.     DVT prophylaxis: SCDs Code Status: Full code Family Communication: none + at bedsdie Disposition Plan:   once Urology issues resolve, hopefully can return to group home ??? Consultants:   psych Procedures:    Antimicrobials:  Anti-infectives    Start     Dose/Rate Route Frequency Ordered Stop   05/18/16 1046  ciprofloxacin (CIPRO) IVPB 400 mg     400 mg 200 mL/hr over 60 Minutes Intravenous 60 min pre-op 05/18/16 1046 05/18/16 1235   05/16/16 0600  cefTRIAXone (ROCEPHIN) 1 g in dextrose 5 % 50 mL IVPB  Status:  Discontinued     1 g 100 mL/hr over 30 Minutes Intravenous Every 12 hours 05/16/16 0412  05/16/16 0443   05/16/16 0600  cefTRIAXone (ROCEPHIN) 1 g in dextrose 5 % 50 mL IVPB  Status:  Discontinued     1 g 100 mL/hr over 30 Minutes Intravenous Every 24 hours 05/16/16 0443 05/18/16 1316   05/02/16 2000  vancomycin (VANCOCIN) IVPB 750 mg/150 ml premix  Status:  Discontinued     750 mg 150 mL/hr over 60 Minutes Intravenous Every 12 hours 05/02/16 0643 05/03/16 0805   05/02/16 1400  piperacillin-tazobactam (ZOSYN) IVPB 3.375 g  Status:  Discontinued     3.375 g 12.5 mL/hr over 240 Minutes Intravenous Every 8 hours 05/02/16 0532 05/04/16 1125   05/02/16 0415  piperacillin-tazobactam (ZOSYN) IVPB 3.375 g  3.375 g 100 mL/hr over 30 Minutes Intravenous  Once 05/02/16 0407 05/02/16 0553   05/02/16 0415  vancomycin (VANCOCIN) IVPB 1000 mg/200 mL premix     1,000 mg 200 mL/hr over 60 Minutes Intravenous  Once 05/02/16 0407 05/02/16 0745   05/01/16 2200  clindamycin (CLEOCIN) IVPB 600 mg     600 mg 100 mL/hr over 30 Minutes Intravenous  Once 05/01/16 2148 05/02/16 0010       Objective: Vitals:   05/22/16 1641 05/22/16 2159 05/23/16 0732 05/23/16 1338  BP: (!) 156/88 (!) 158/94 (!) 170/86 123/89  Pulse: 63 72 80 79  Resp: 16 20 18 17   Temp: 97.4 F (36.3 C) 97.7 F (36.5 C) 98 F (36.7 C) 97.9 F (36.6 C)  TempSrc: Oral Oral Oral Oral  SpO2: 99%   98%  Weight:      Height:        Intake/Output Summary (Last 24 hours) at 05/23/16 1402 Last data filed at 05/23/16 1000  Gross per 24 hour  Intake             4220 ml  Output                0 ml  Net             4220 ml   Filed Weights   05/02/16 0640  Weight: 71 kg (156 lb 8.4 oz)    Examination: General exam: alert, nonverbal, does not follow commands HEENT: PERRLA, oral mucosa moist, no sclera icterus or thrush Respiratory system: Clear to auscultation. Respiratory effort normal. Cardiovascular system: S1 & S2 heard, RRR.   Gastrointestinal system: Abdomen soft, non-tender, bladder distended Central nervous  system: moves all extremities Extremities: No cyanosis, clubbing-  Skin: No rashes or ulcers Psychiatry:  Intermittently agitated requiring sedation    Data Reviewed: I have personally reviewed following labs and imaging studies  CBC:  Recent Labs Lab 05/21/16 0833  WBC 6.0  HGB 10.5*  HCT 29.9*  MCV 94.0  PLT 227   Basic Metabolic Panel:  Recent Labs Lab 05/21/16 0833 05/23/16 0539  NA 142 141  K 3.8 3.9  CL 107 104  CO2 30 29  GLUCOSE 109* 91  BUN 20 18  CREATININE 1.31* 1.14  CALCIUM 9.6 9.9  MG 1.8  --    GFR: Estimated Creatinine Clearance: 65.7 mL/min (by C-G formula based on SCr of 1.14 mg/dL). Liver Function Tests: No results for input(s): AST, ALT, ALKPHOS, BILITOT, PROT, ALBUMIN in the last 168 hours. No results for input(s): LIPASE, AMYLASE in the last 168 hours. No results for input(s): AMMONIA in the last 168 hours. Coagulation Profile: No results for input(s): INR, PROTIME in the last 168 hours. Cardiac Enzymes: No results for input(s): CKTOTAL, CKMB, CKMBINDEX, TROPONINI in the last 168 hours. BNP (last 3 results) No results for input(s): PROBNP in the last 8760 hours. HbA1C: No results for input(s): HGBA1C in the last 72 hours. CBG: No results for input(s): GLUCAP in the last 168 hours. Lipid Profile: No results for input(s): CHOL, HDL, LDLCALC, TRIG, CHOLHDL, LDLDIRECT in the last 72 hours. Thyroid Function Tests: No results for input(s): TSH, T4TOTAL, FREET4, T3FREE, THYROIDAB in the last 72 hours. Anemia Panel: No results for input(s): VITAMINB12, FOLATE, FERRITIN, TIBC, IRON, RETICCTPCT in the last 72 hours. Urine analysis:    Component Value Date/Time   COLORURINE YELLOW 05/15/2016 0939   APPEARANCEUR CLOUDY (A) 05/15/2016 0939   LABSPEC 1.010 05/15/2016 0939   PHURINE  7.5 05/15/2016 0939   GLUCOSEU NEGATIVE 05/15/2016 0939   HGBUR LARGE (A) 05/15/2016 0939   BILIRUBINUR NEGATIVE 05/15/2016 0939   KETONESUR NEGATIVE 05/15/2016  0939   PROTEINUR 30 (A) 05/15/2016 0939   UROBILINOGEN 1.0 01/16/2012 1957   NITRITE NEGATIVE 05/15/2016 0939   LEUKOCYTESUR TRACE (A) 05/15/2016 0939   Sepsis Labs: @LABRCNTIP (procalcitonin:4,lacticidven:4) ) Recent Results (from the past 240 hour(s))  Urine culture     Status: None   Collection Time: 05/15/16  9:39 AM  Result Value Ref Range Status   Specimen Description URINE, CATHETERIZED  Final   Special Requests Normal  Final   Culture   Final    NO GROWTH Performed at [facility], [address] [place], [place] [address]    Report Status 05/16/2016 FINAL  Final         Radiology Studies: No results found.    Scheduled Meds: . benztropine  0.5 mg Oral Daily  . carbamazepine  200 mg Oral BID PC  . feeding supplement (ENSURE ENLIVE)  237 mL Oral BID BM  . haloperidol  5 mg Oral BID  . lamoTRIgine  100 mg Oral BID  . pantoprazole  40 mg Oral Daily  . polycarbophil  625 mg Oral q morning - 10a  . potassium chloride  10 mEq Oral q morning - 10a  . tamsulosin  0.4 mg Oral Daily   Continuous Infusions: . lactated ringers 50 mL/hr at 05/23/16 1006     LOS: 21 days    Time spent in minutes: 25   Pleas Koch, MD [facility] [phone]  05/23/2016, 2:02 PM

## 2016-05-23 NOTE — Progress Notes (Signed)
MD made aware of void and bladder scan results. MD did not want to place catheter at this time. Will continue to monitor.

## 2016-05-23 NOTE — Progress Notes (Signed)
Assessment: Acute on chronic urinary retention - his catheter was removed yesterday.  I do not see where urine output has been recorded.  He has had a low urine output.  He also has a long-standing history of retention and very likely has a large bladder capacity.  It is certainly possible that he may have detrusor hypertonicity to long-standing outlet obstruction.  My hope is that he developed overflow incontinence.  Examination today revealed no suprapubic tenderness by means of assessing for grimace with palpation of the suprapubic region.  His creatinine remains normal. He either developed overflow incontinence or I will need to perform an external sphincterotomy which will render him completely incontinent.  Although I would prefer to avoid this it may be necessary.  Plan: 1.  Continue to monitor for urine output. 2.  Monitor creatinine.    Subjective: Patient awake but noncommunicative  Objective: Vital signs in last 24 hours: Temp:  [97.4 F (36.3 C)-97.7 F (36.5 C)] 97.7 F (36.5 C) (03/13 2159) Pulse Rate:  [63-72] 72 (03/13 2159) Resp:  [16-20] 20 (03/13 2159) BP: (156-158)/(88-94) 158/94 (03/13 2159) SpO2:  [99 %] 99 % (03/13 1641)A  Intake/Output from previous day: 03/13 0701 - 03/14 0700 In: 3580 [P.O.:580; I.V.:3000] Out: 700 [Urine:700] Intake/Output this shift: Total I/O In: 1687.9 [P.O.:240; I.V.:1447.9] Out: -   Past Medical History:  Diagnosis Date  . MR (mental retardation)   . Schizophrenia [facility](HCC)     Physical Exam:  Lungs - Normal respiratory effort, chest expands symmetrically.  Abdomen - Soft, non-tender & slightly distended.   Lab Results:  Recent Labs  05/21/16 0833  WBC 6.0  HGB 10.5*  HCT 29.9*   BMET  Recent Labs  05/21/16 0833 05/23/16 0539  NA 142 141  K 3.8 3.9  CL 107 104  CO2 30 29  GLUCOSE 109* 91  BUN 20 18  CREATININE 1.31* 1.14  CALCIUM 9.6 9.9   No results for input(s): LABURIN in the last 72 hours. Results  for orders placed or performed during the hospital encounter of 05/01/16  MRSA PCR Screening     Status: None   Collection Time: 05/02/16  3:08 AM  Result Value Ref Range Status   MRSA by PCR NEGATIVE NEGATIVE Final    Comment:        The GeneXpert MRSA Assay (FDA approved for NASAL specimens only), is one component of a comprehensive MRSA colonization surveillance program. It is not intended to diagnose MRSA infection nor to guide or monitor treatment for MRSA infections.   Culture, blood (x 2)     Status: None   Collection Time: 05/02/16  8:19 AM  Result Value Ref Range Status   Specimen Description BLOOD LEFT ARM  Final   Special Requests IN PEDIATRIC BOTTLE 1CC  Final   Culture   Final    NO GROWTH 5 DAYS Performed at [facility], [address] [place], [place] [address]    Report Status 05/07/2016 FINAL  Final  Culture, blood (x 2)     Status: None   Collection Time: 05/02/16  8:19 AM  Result Value Ref Range Status   Specimen Description BLOOD LEFT ARM  Final   Special Requests IN PEDIATRIC BOTTLE 2CC  Final   Culture   Final    NO GROWTH 5 DAYS Performed at [facility], [address] [place], [place] [address]    Report Status 05/07/2016 FINAL  Final  Culture, blood (routine x 2)  Status: None   Collection Time: 05/10/16 11:00 PM  Result Value Ref Range Status   Specimen Description BLOOD LEFT ARM  Final   Special Requests IN PEDIATRIC BOTTLE 1.5 CC  Final   Culture   Final    NO GROWTH 5 DAYS Performed at [facility], [address] [place], [place] [address]    Report Status 05/16/2016 FINAL  Final  Culture, blood (routine x 2)     Status: Abnormal   Collection Time: 05/10/16 11:00 PM  Result Value Ref Range Status   Specimen Description BLOOD RIGHT FOREARM  Final   Special Requests IN PEDIATRIC BOTTLE 3 CC  Final   Culture  Setup Time   Final    GRAM POSITIVE COCCI IN CLUSTERS IN PEDIATRIC BOTTLE CRITICAL RESULT CALLED TO,  READ BACK BY AND VERIFIED WITH: T. GREEN, PHARM AT [facility], 05/12/16 AT 1738 BY J FUDESCO    Culture (A)  Final    STAPHYLOCOCCUS SPECIES (COAGULASE NEGATIVE) THE SIGNIFICANCE OF ISOLATING THIS ORGANISM FROM A SINGLE SET OF BLOOD CULTURES WHEN MULTIPLE SETS ARE DRAWN IS UNCERTAIN. PLEASE NOTIFY THE MICROBIOLOGY DEPARTMENT WITHIN ONE WEEK IF SPECIATION AND SENSITIVITIES ARE REQUIRED. Performed at [facility], [address], [place], [place] [address]    Report Status 05/14/2016 FINAL  Final  Blood Culture ID Panel (Reflexed)     Status: None   Collection Time: 05/10/16 11:00 PM  Result Value Ref Range Status   Enterococcus species NOT DETECTED NOT DETECTED Final   Listeria monocytogenes NOT DETECTED NOT DETECTED Final   Staphylococcus species NOT DETECTED NOT DETECTED Final   Staphylococcus aureus NOT DETECTED NOT DETECTED Final   Streptococcus species NOT DETECTED NOT DETECTED Final   Streptococcus agalactiae NOT DETECTED NOT DETECTED Final   Streptococcus pneumoniae NOT DETECTED NOT DETECTED Final   Streptococcus pyogenes NOT DETECTED NOT DETECTED Final   Acinetobacter baumannii NOT DETECTED NOT DETECTED Final   Enterobacteriaceae species NOT DETECTED NOT DETECTED Final   Enterobacter cloacae complex NOT DETECTED NOT DETECTED Final   Escherichia coli NOT DETECTED NOT DETECTED Final   Klebsiella oxytoca NOT DETECTED NOT DETECTED Final   Klebsiella pneumoniae NOT DETECTED NOT DETECTED Final   Proteus species NOT DETECTED NOT DETECTED Final   Serratia marcescens NOT DETECTED NOT DETECTED Final   Haemophilus influenzae NOT DETECTED NOT DETECTED Final   Neisseria meningitidis NOT DETECTED NOT DETECTED Final   Pseudomonas aeruginosa NOT DETECTED NOT DETECTED Final   Candida albicans NOT DETECTED NOT DETECTED Final   Candida glabrata NOT DETECTED NOT DETECTED Final   Candida krusei NOT DETECTED NOT DETECTED Final   Candida parapsilosis NOT DETECTED NOT DETECTED Final   Candida  tropicalis NOT DETECTED NOT DETECTED Final  Urine culture     Status: None   Collection Time: 05/15/16  9:39 AM  Result Value Ref Range Status   Specimen Description URINE, CATHETERIZED  Final   Special Requests Normal  Final   Culture   Final    NO GROWTH Performed at [facility], [address] [place], [place] [address]    Report Status 05/16/2016 FINAL  Final    Studies/Results: No results found.    [place] 05/23/2016, 6:53 AM

## 2016-05-23 NOTE — Progress Notes (Signed)
Patient had not voided since catheter removal yesterday. Bladder scan showed greater than 999cc. Patient had small incontinent episode and post bladder scan still showed greater than 999. Will continue to monitor.

## 2016-05-23 NOTE — Progress Notes (Signed)
Patient had very large void. Saturated 2 bed pads. Bladder scan after void still showed greater than 800cc. Will continue to monitor.

## 2016-05-23 NOTE — Progress Notes (Signed)
Pt continues to have no urine output. Bladder scan performed x2. See flowsheets.

## 2016-05-24 MED ORDER — HALOPERIDOL 5 MG PO TABS: 5.0000 mg | ORAL_TABLET | Freq: Two times a day (BID) | ORAL | 0 refills | Status: AC

## 2016-05-24 MED ORDER — LAMOTRIGINE 100 MG PO TABS: 100.0000 mg | ORAL_TABLET | Freq: Two times a day (BID) | ORAL | 0 refills | Status: DC

## 2016-05-24 MED ORDER — LORAZEPAM 0.5 MG PO TABS: 0.5000 mg | ORAL_TABLET | Freq: Two times a day (BID) | ORAL | 0 refills | Status: DC

## 2016-05-24 MED ORDER — LORAZEPAM 1 MG PO TABS: 1.0000 mg | ORAL_TABLET | Freq: Four times a day (QID) | ORAL | 0 refills | Status: DC | PRN

## 2016-05-24 MED ORDER — BENZTROPINE MESYLATE 0.5 MG PO TABS: 0.5000 mg | ORAL_TABLET | Freq: Every day | ORAL | 0 refills | Status: DC

## 2016-05-24 NOTE — Progress Notes (Signed)
6 Days Post-Op  Assessment: Chronic urinary retention - he has begun to void.  Ultrasound PVRs are elevated however they likely may always have been elevated since I have never been able to perform an ultrasound PVR in my office as the patient would not permit this and was not restrained.  At this point I don't see where continued checking of PVR is going to change clinical management in any way.  As long as he continues to urinate or is incontinent and his creatinine remains normal nothing further needs to be done from a urologic standpoint.  ** In the future I would not recommend placing a Foley catheter for "urinary retention" based on ultrasound PVR results but rather only if he has developed worsening creatinine or hydronephrosis. **  Plan: 1.  Monitor creatinine. 2.  He could potentially be discharged if his creatinine remains unchanged.    Subjective: Patient is noncommunicative  Objective: Vital signs in last 24 hours: Temp:  [97.5 F (36.4 C)-97.9 F (36.6 C)] 97.5 F (36.4 C) (03/15 0540) Pulse Rate:  [51-79] 51 (03/15 0540) Resp:  [14-17] 14 (03/15 0540) BP: (123-156)/(74-89) 141/74 (03/15 0540) SpO2:  [98 %-100 %] 99 % (03/15 0540)  Intake/Output from previous day: 03/14 0701 - 03/15 0700 In: 1627.5 [P.O.:120; I.V.:1507.5] Out: 1161 [Urine:1161] Intake/Output this shift: No intake/output data recorded.  Past Medical History:  Diagnosis Date  . MR (mental retardation)   . Schizophrenia (HCC)    Current Facility-Administered Medications  Medication Dose Route Frequency Provider Last Rate Last Dose  . acetaminophen (TYLENOL) tablet 650 mg  650 mg Oral Q6H PRN Eduard Clos, MD   650 mg at 05/18/16 0836   Or  . acetaminophen (TYLENOL) suppository 650 mg  650 mg Rectal Q6H PRN Eduard Clos, MD      . asenapine (SAPHRIS) sublingual tablet 5 mg  5 mg Sublingual Q12H PRN Thedore Mins, MD   5 mg at 05/19/16 0901  . benztropine (COGENTIN) tablet 0.5 mg  0.5  mg Oral Daily Mojeed Akintayo, MD   0.5 mg at 05/23/16 1050  . carbamazepine (TEGRETOL) tablet 200 mg  200 mg Oral BID PC Mojeed Akintayo, MD   200 mg at 05/23/16 1050  . feeding supplement (ENSURE ENLIVE) (ENSURE ENLIVE) liquid 237 mL  237 mL Oral BID BM [name], MD   237 mL at 05/23/16 1049  . haloperidol (HALDOL) tablet 5 mg  5 mg Oral BID Calvert Cantor, MD   5 mg at 05/23/16 2257  . lactated ringers infusion   Intravenous Continuous Rhetta Mura, MD 50 mL/hr at 05/24/16 0026    . lamoTRIgine (LAMICTAL) tablet 100 mg  100 mg Oral BID Eduard Clos, MD   100 mg at 05/23/16 2257  . LORazepam (ATIVAN) injection 1 mg  1 mg Intravenous Q4H PRN Calvert Cantor, MD   1 mg at 05/22/16 0025  . ondansetron (ZOFRAN) tablet 4 mg  4 mg Oral Q6H PRN Eduard Clos, MD       Or  . ondansetron [facility]) injection 4 mg  4 mg Intravenous Q6H PRN Eduard Clos, MD      . pantoprazole (PROTONIX) EC tablet 40 mg  40 mg Oral Daily Eduard Clos, MD   40 mg at 05/23/16 1050  . polycarbophil (FIBERCON) tablet 625 mg  625 mg Oral q morning - 10a Eduard Clos, MD   625 mg at 05/23/16 1049  . potassium chloride (K-DUR,KLOR-CON) CR tablet 10 mEq  10 mEq Oral q morning - 10a Eduard ClosArshad N Kakrakandy, MD   10 mEq at 05/23/16 1050  . tamsulosin (FLOMAX) capsule 0.4 mg  0.4 mg Oral Daily Blenda MountsMatthew R Macey, MD   0.4 mg at 05/23/16 1050    Physical Exam:  General: Patient is in no apparent distress Lungs: Normal respiratory effort, chest expands symmetrically. GI: The abdomen is soft and nontender without mass.    Lab Results:  Recent Labs  05/21/16 0833  WBC 6.0  HGB 10.5*  HCT 29.9*   BMET  Recent Labs  05/21/16 0833 05/23/16 0539  NA 142 141  K 3.8 3.9  CL 107 104  CO2 30 29  GLUCOSE 109* 91  BUN 20 18  CREATININE 1.31* 1.14  CALCIUM 9.6 9.9   No results for input(s): LABPT, INR in the last 72 hours. No results for input(s): LABURIN in the last 72 hours. Results for  orders placed or performed during the hospital encounter of 05/01/16  MRSA PCR Screening     Status: None   Collection Time: 05/02/16  3:08 AM  Result Value Ref Range Status   MRSA by PCR NEGATIVE NEGATIVE Final    Comment:        The GeneXpert MRSA Assay (FDA approved for NASAL specimens only), is one component of a comprehensive MRSA colonization surveillance program. It is not intended to diagnose MRSA infection nor to guide or monitor treatment for MRSA infections.   Culture, blood (x 2)     Status: None   Collection Time: 05/02/16  8:19 AM  Result Value Ref Range Status   Specimen Description BLOOD LEFT ARM  Final   Special Requests IN PEDIATRIC BOTTLE 1CC  Final   Culture   Final    NO GROWTH 5 DAYS Performed at [facility], [address] [place], [place] [address]    Report Status 05/07/2016 FINAL  Final  Culture, blood (x 2)     Status: None   Collection Time: 05/02/16  8:19 AM  Result Value Ref Range Status   Specimen Description BLOOD LEFT ARM  Final   Special Requests IN PEDIATRIC BOTTLE 2CC  Final   Culture   Final    NO GROWTH 5 DAYS Performed at [facility], [address] [place], [place] [address]    Report Status 05/07/2016 FINAL  Final  Culture, blood (routine x 2)     Status: None   Collection Time: 05/10/16 11:00 PM  Result Value Ref Range Status   Specimen Description BLOOD LEFT ARM  Final   Special Requests IN PEDIATRIC BOTTLE 1.5 CC  Final   Culture   Final    NO GROWTH 5 DAYS Performed at [facility], [address] [place], [place] [address]    Report Status 05/16/2016 FINAL  Final  Culture, blood (routine x 2)     Status: Abnormal   Collection Time: 05/10/16 11:00 PM  Result Value Ref Range Status   Specimen Description BLOOD RIGHT FOREARM  Final   Special Requests IN PEDIATRIC BOTTLE 3 CC  Final   Culture  Setup Time   Final    GRAM POSITIVE COCCI IN CLUSTERS IN PEDIATRIC BOTTLE CRITICAL RESULT CALLED TO, READ  BACK BY AND VERIFIED WITH: T. GREEN, PHARM AT [facility], 05/12/16 AT 1738 BY J FUDESCO    Culture (A)  Final    STAPHYLOCOCCUS SPECIES (COAGULASE NEGATIVE) THE SIGNIFICANCE OF ISOLATING THIS ORGANISM FROM A SINGLE SET OF  BLOOD CULTURES WHEN MULTIPLE SETS ARE DRAWN IS UNCERTAIN. PLEASE NOTIFY THE MICROBIOLOGY DEPARTMENT WITHIN ONE WEEK IF SPECIATION AND SENSITIVITIES ARE REQUIRED. Performed at [facility], [address] [place], [place] [address]    Report Status 05/14/2016 FINAL  Final  Blood Culture ID Panel (Reflexed)     Status: None   Collection Time: 05/10/16 11:00 PM  Result Value Ref Range Status   Enterococcus species NOT DETECTED NOT DETECTED Final   Listeria monocytogenes NOT DETECTED NOT DETECTED Final   Staphylococcus species NOT DETECTED NOT DETECTED Final   Staphylococcus aureus NOT DETECTED NOT DETECTED Final   Streptococcus species NOT DETECTED NOT DETECTED Final   Streptococcus agalactiae NOT DETECTED NOT DETECTED Final   Streptococcus pneumoniae NOT DETECTED NOT DETECTED Final   Streptococcus pyogenes NOT DETECTED NOT DETECTED Final   Acinetobacter baumannii NOT DETECTED NOT DETECTED Final   Enterobacteriaceae species NOT DETECTED NOT DETECTED Final   Enterobacter cloacae complex NOT DETECTED NOT DETECTED Final   Escherichia coli NOT DETECTED NOT DETECTED Final   Klebsiella oxytoca NOT DETECTED NOT DETECTED Final   Klebsiella pneumoniae NOT DETECTED NOT DETECTED Final   Proteus species NOT DETECTED NOT DETECTED Final   Serratia marcescens NOT DETECTED NOT DETECTED Final   Haemophilus influenzae NOT DETECTED NOT DETECTED Final   Neisseria meningitidis NOT DETECTED NOT DETECTED Final   Pseudomonas aeruginosa NOT DETECTED NOT DETECTED Final   Candida albicans NOT DETECTED NOT DETECTED Final   Candida glabrata NOT DETECTED NOT DETECTED Final   Candida krusei NOT DETECTED NOT DETECTED Final   Candida parapsilosis NOT DETECTED NOT DETECTED Final   Candida tropicalis NOT  DETECTED NOT DETECTED Final  Urine culture     Status: None   Collection Time: 05/15/16  9:39 AM  Result Value Ref Range Status   Specimen Description URINE, CATHETERIZED  Final   Special Requests Normal  Final   Culture   Final    NO GROWTH Performed at [facility], [address], [place], [place] [address]    Report Status 05/16/2016 FINAL  Final    Studies/Results: No results found.     [place] 05/24/2016, 7:39 AM

## 2016-05-24 NOTE — Progress Notes (Addendum)
CSW left voicemail for Group home director, Anselm Lisnoch and spoke with patient's sister, Francena HanlyStella to make them aware that patient is stable for discharge back to group home today. CSW awaiting call back from [place] with time that he will be able to pickup patient.   *Update: CSW received call back from patient's sister, Francena HanlyStella who states that she spoke with Anselm LisEnoch and he plans to pick up patient today though did not specify a time. RN, Charline BillsKristyn made aware.    Lincoln MaxinKelly Marvelle Span, LCSW [facility] Clinical Social Worker cell #: [phone]

## 2016-05-24 NOTE — Progress Notes (Signed)
CSW spoke with Anselm Lisnoch who states that he would be here no later than 7:00 to pickup patient to take back to the group home. RN, Charline BillsKristyn made aware.    Lincoln MaxinKelly Sandee Bernath, LCSW [facility] Clinical Social Worker cell #: [phone]

## 2016-05-24 NOTE — Care Management Note (Signed)
Case Management Note  Patient Details  Name: Mart PiggsCharlie Leber MRN: [id number] Date of Birth: 02-Feb-1951  Subjective/Objective:  66 y.o. M who suffers from MR and Schiz, hx of TBI. CSW working on transfer back to [facility] as he is medically stable for discharge. No additional CM needs at this time.                   Action/Plan:CM will sign off for now but will be available should additional discharge needs arise or disposition change.    Expected Discharge Date:  05/24/16               Expected Discharge Plan:  Group Home  In-House Referral:  Clinical Social Work  Discharge planning Services  CM Consult  Post Acute Care Choice:  NA Choice offered to:     DME Arranged:    DME Agency:     HH Arranged:    HH Agency:     Status of Service:  Completed, signed off  If discussed at [employer] Length of Stay Meetings, dates discussed:05/24/2016    Additional Comments:  Yvone NeuCrutchfield, Nori Poland M, RN 05/24/2016, 3:06 PM

## 2016-05-24 NOTE — Discharge Summary (Signed)
Physician Discharge Summary  Antonio Montoya RUE:[id number] DOB: [birth year] DOA: 05/01/2016  PCP: Shayne Alken, MD (Inactive)  Admit date: 05/01/2016 Discharge date: 05/24/2016  Time spent: 40 minutes  Recommendations for Outpatient Follow-up:  decreased Haldol from 25 to 15 QHS and now Haldol 5 mg BID Added 1mg  Ativan QHS  now changed to 0.5 mg BID with 1mg  Q 6 PRN Lamictal decreased from 125 in AM and 150 in PM  to 100  BID Cogentin decreased from 1mg  daily to 0.5 mg daily Saphris 5mg  Q12 PRN for agitation added Tegretol 200 mg BID added  Risperdal ( which was given routine and PRN at home) stopped-  Oxcarbazine stopped 1. Recommend use of depends- would not place Foley catheter as with worsening agitation 2. Needs basic metabolic panel as an outpatient  3. Might require restraints as an outpatient   Discharge Diagnoses:  Principal Problem:   Acute delirium Active Problems:   Pressure injury of skin   Hypothermia   Elevated LFTs   Urinary retention   Thrombocytopenia (HCC)   Discharge Condition: fair  Diet recommendation:  any  Filed Weights   05/02/16 0640  Weight: 71 kg (156 lb 8.4 oz)    History of present illness: per Dr. Butler Montoya 66 y.o.malewith DM diet controlled, mental retardation and schizophrenia had come to the ER 2/19 for urinary retention (bladder scan showed > 1 L urine and caretaker stated he had not urinated in 18 hrs). He had Foley catheter placed and sent back to group home.  Patient is unable to give history- per ER HPI, he came if in for hematuria after pulling on his foley cath. He has beem increasingly agitated for a few wks per caregiver.Caregiver stated pt recently had a fit and broke several items including a glass ~ 3 days prior to admission & had swelling to the left hand on the day of admission .  In the ER patient had to be sedated with Ativan. He was found to have hypothermia with temp of 95.7.  This is the 5th visit to the ER since Jan of  this year.  1/21- stumbled, fell, abrasion to forehead 2/4- left subconjunctival hemorrhage 2/16- off balance- head CT ok  2/16- U retention  Hospital Course:  Patient was admitted and found to have new hypothermia urinary retention and it was felt that the Foley was cause for his agitation-psychiatry saw the patient and recommended further medication changes as per [facility] was consulted for urinary retention and he ultimately had a cystoscopy and TURP performed by Dr. Melvyn Novas and Foley was kept out It was felt that he should would have large amounts of urine and he would eventually have overflow incontinence and it was recommended not to place further Foley catheters on the patient  During hospital daysepsis with found and his hypothermia was probably secondary to antipsychotics  He had mildly elevated LFT with dilatation but once again it was felt that this was secondary Transient thrombus and he was thought to be secondary to Zosyn which he was placed on and this did resolve He was felt to stabilized enough for discharge on 05/24/16 and was discharged back to his group home    Discharge Exam: Vitals:   05/23/16 2017 05/24/16 0540  BP: (!) 156/84 (!) 141/74  Pulse: 77 (!) 51  Resp: 16 14  Temp: 97.6 F (36.4 C) 97.5 F (36.4 C)    General: non-veral Cardiovascular: s1 s 2no m/r/g Respiratory: clear  Discharge Instructions   Discharge Instructions  Diet - low sodium heart healthy    Complete by:  As directed    Increase activity slowly    Complete by:  As directed      Current Discharge Medication List    START taking these medications   Details  acetaminophen (TYLENOL) 325 MG tablet Take 2 tablets (650 mg total) by mouth every 6 (six) hours as needed for mild pain (or Fever >/= 101).    asenapine (SAPHRIS) 5 MG SUBL 24 hr tablet Place 1 tablet (5 mg total) under the tongue every 12 (twelve) hours as needed (for agitation). Qty: 60 tablet, Refills: 0     carbamazepine (TEGRETOL) 200 MG tablet Take 1 tablet (200 mg total) by mouth 2 (two) times daily after a meal. Qty: 60 tablet, Refills: 0    HYDROcodone-acetaminophen (NORCO) 10-325 MG tablet Take 1-2 tablets by mouth every 4 (four) hours as needed for moderate pain. Maximum dose per 24 hours -8 pills. Qty: 16 tablet, Refills: 0    phenazopyridine (PYRIDIUM) 200 MG tablet Take 1 tablet (200 mg total) by mouth 3 (three) times daily as needed for pain. Qty: 15 tablet, Refills: 0      CONTINUE these medications which have CHANGED   Details  haloperidol (HALDOL) 5 MG tablet Take 1 tablet (5 mg total) by mouth 2 (two) times daily. Qty: 60 tablet, Refills: 0      CONTINUE these medications which have NOT CHANGED   Details  benztropine (COGENTIN) 1 MG tablet Take 1 mg by mouth 2 (two) times daily.    lamoTRIgine (LAMICTAL) 100 MG tablet Take 100 mg by mouth 2 (two) times daily.     omeprazole (PRILOSEC) 20 MG capsule Take 20 mg by mouth every morning.     potassium chloride (K-DUR,KLOR-CON) 10 MEQ tablet Take 10 mEq by mouth every morning.      STOP taking these medications     Oxcarbazepine (TRILEPTAL) 300 MG tablet      risperiDONE (RISPERDAL M-TABS) 2 MG disintegrating tablet      risperiDONE (RISPERDAL) 3 MG tablet      tamsulosin (FLOMAX) 0.4 MG CAPS capsule      traZODone (DESYREL) 100 MG tablet      polycarbophil (FIBERCON) 625 MG tablet        No Known Allergies Follow-up Information    [facility] On 06/01/2016.   Why:  For your appiontment at 10:45 Contact information: [address]  [place] [address] [phone]            The results of significant diagnostics from this hospitalization (including imaging, microbiology, ancillary and laboratory) are listed below for reference.    Significant Diagnostic Studies: Ct Head Wo Contrast  Result Date: 05/01/2016 CLINICAL DATA:  Patient is agitated with delirium. History of  schizophrenia and mental retardation. EXAM: CT HEAD WITHOUT CONTRAST TECHNIQUE: Contiguous axial images were obtained from the base of the skull through the vertex without intravenous contrast. COMPARISON:  04/27/2016. FINDINGS: Brain: No evidence of acute infarction, hemorrhage, hydrocephalus, extra-axial collection or mass lesion/mass effect. Vascular: No hyperdense vessel or unexpected calcification. Skull: Normal. Negative for fracture or focal lesion. Sinuses/Orbits: Globes orbits are unremarkable. Mild right maxillary sinus mucosal thickening. Sinuses otherwise essentially clear. Clear mastoid air cells. Other: None. IMPRESSION: 1. No acute intracranial abnormalities. No change from the prior head CT. Electronically Signed   By: Amie Portlandavid  Ormond M.D.   On: 05/01/2016 19:55   Ct Head Wo Contrast  Result Date: 04/27/2016 CLINICAL DATA:  Initial evaluation for balance issues. EXAM: CT HEAD WITHOUT CONTRAST TECHNIQUE: Contiguous axial images were obtained from the base of the skull through the vertex without intravenous contrast. COMPARISON:  Prior CT 10/22/2003. FINDINGS: Brain: Study limited by patient positioning. Atrophy with mild chronic small vessel ischemic disease. No acute intracranial hemorrhage. No evidence for acute large vessel territory infarct. No mass lesion, midline shift or mass effect. No hydrocephalus. No extra-axial fluid collection. Vascular: No hyperdense vessel. Skull: Scalp soft tissues demonstrate no acute abnormality. Calvarium intact. Sinuses/Orbits: Globes and orbital soft tissues within normal limits. Paranasal sinuses and mastoid air cells are clear. IMPRESSION: 1. No acute intracranial process. 2. Generalized age-related cerebral atrophy with mild chronic small vessel ischemic disease. Electronically Signed   By: Rise Mu M.D.   On: 04/27/2016 22:36   Ct Abdomen W Contrast  Result Date: 05/03/2016 CLINICAL DATA:  Urinary retention and hematuria after pulling out a  Foley catheter. EXAM: CT ABDOMEN WITH CONTRAST TECHNIQUE: Multidetector CT imaging of the abdomen was performed using the standard protocol following bolus administration of intravenous contrast. CONTRAST:  ISOVUE-300 IOPAMIDOL (ISOVUE-300) INJECTION 61% COMPARISON:  CT scan from 2012. FINDINGS: Lower chest: The lung bases demonstrate bilateral effusions and bibasilar infiltrates. The heart is enlarged but stable. There is tortuosity and mild ectasia of the thoracic aorta. Hepatobiliary: Diffuse fatty infiltration of the liver but no focal hepatic lesions. The gallbladder is grossly normal. Mild common bile duct dilatation without obvious cause. Maximum diameter is 8 mm. Recommend correlation with liver function studies. MRCP would not be an option for this patient. Pancreas: Grossly normal. Spleen: Grossly normal. Adrenals/Urinary Tract: The adrenal glands and kidneys are grossly normal. No hydronephrosis. Small renal cysts. Stomach/Bowel: Grossly normal.  Moderate stool in the colon. Vascular/Lymphatic: The aorta and branch vessels are patent. 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 Possible web or focal dissection in the left iliac artery but it is not completely covered. Other: No ascites. Musculoskeletal: No significant bony findings. IMPRESSION: Limited examination due to patient motion. Small bilateral pleural effusions and bibasilar infiltrates. Mild common bile duct and cystic duct dilatation without obvious cause. The common bile duct tapers normally to the duodenum. Recommend correlation with liver function studies. Patient would not be a candidate for MRCP. No acute abdominal findings, mass lesions or adenopathy. Electronically Signed   By: Rudie Meyer M.D.   On: 05/03/2016 15:17   Dg Chest Port 1 View  Result Date: 05/02/2016 CLINICAL DATA:  Sepsis EXAM: PORTABLE CHEST 1 VIEW COMPARISON:  Chest radiograph 06/17/2011 FINDINGS: Cardiac silhouette is  enlarged mildly. There is no focal airspace consolidation or pulmonary edema. No pneumothorax or sizable pleural effusion. IMPRESSION: No focal airspace disease. Electronically Signed   By: Deatra Robinson M.D.   On: 05/02/2016 04:48   Dg Hand Complete Left  Result Date: 05/01/2016 CLINICAL DATA:  LT hand injury x 3 days ago with swelling on today. Caregiver states pt recently had a fit and broke several items including a glass. Pt has HX: Mental Retardation and Schizophrenia. Pt was combative prior to xray and was sedated in order to get xrays. 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 EXAM: LEFT HAND - COMPLETE 3+ VIEW COMPARISON:  None. FINDINGS: No fracture or dislocation. 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 There is narrowing of the radiocarpal joint between the radius and  scaphoid, with mild subchondral sclerosis. Soft tissue swelling is noted most evident dorsally. No soft tissue air or radiopaque foreign body. IMPRESSION: 1. No acute fracture.  No dislocation. 2. 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 3. Nonspecific soft tissue swelling most evident dorsally. Electronically Signed   By: Amie Portland M.D.   On: 05/01/2016 18:14    Microbiology: Recent Results (from the past 240 hour(s))  Urine culture     Status: None   Collection Time: 05/15/16  9:39 AM  Result Value Ref Range Status   Specimen Description URINE, CATHETERIZED  Final   Special Requests Normal  Final   Culture   Final    NO GROWTH Performed at [facility], [address], [place], [place] [address]    Report Status 05/16/2016 FINAL  Final     Labs: Basic Metabolic Panel:  Recent Labs Lab 05/21/16 0833 05/23/16 0539  NA 142 141  K 3.8 3.9  CL 107 104  CO2 30 29  GLUCOSE 109* 91  BUN 20 18  CREATININE 1.31* 1.14  CALCIUM 9.6 9.9  MG 1.8  --    Liver  Function Tests: No results for input(s): AST, ALT, ALKPHOS, BILITOT, PROT, ALBUMIN in the last 168 hours. No results for input(s): LIPASE, AMYLASE in the last 168 hours. No results for input(s): AMMONIA in the last 168 hours. CBC:  Recent Labs Lab 05/21/16 0833  WBC 6.0  HGB 10.5*  HCT 29.9*  MCV 94.0  PLT 227   Cardiac Enzymes: No results for input(s): CKTOTAL, CKMB, CKMBINDEX, TROPONINI in the last 168 hours. BNP: BNP (last 3 results) No results for input(s): BNP in the last 8760 hours.  ProBNP (last 3 results) No results for input(s): PROBNP in the last 8760 hours.  CBG: No results for input(s): GLUCAP in the last 168 hours.     SignedRhetta Mura MD   [facility] 05/24/2016, 10:23 AM

## 2016-05-28 ENCOUNTER — Emergency Department (HOSPITAL_COMMUNITY): Payer: Medicare Other

## 2016-05-28 ENCOUNTER — Encounter (HOSPITAL_COMMUNITY): Payer: Self-pay | Admitting: Emergency Medicine

## 2016-05-28 ENCOUNTER — Inpatient Hospital Stay (HOSPITAL_COMMUNITY)
Admission: EM | Admit: 2016-05-28 | Discharge: 2016-06-04 | DRG: 871 | Disposition: A | Discharge status: Skilled Nursing Facility | Payer: Medicare Other | Attending: Internal Medicine | Admitting: Internal Medicine

## 2016-05-28 DIAGNOSIS — R1319 Other dysphagia: Secondary | ICD-10-CM | POA: Diagnosis present

## 2016-05-28 DIAGNOSIS — R419 Unspecified symptoms and signs involving cognitive functions and awareness: Secondary | ICD-10-CM | POA: Diagnosis not present

## 2016-05-28 DIAGNOSIS — Z1619 Resistance to other specified beta lactam antibiotics: Secondary | ICD-10-CM | POA: Diagnosis present

## 2016-05-28 DIAGNOSIS — R262 Difficulty in walking, not elsewhere classified: Secondary | ICD-10-CM | POA: Diagnosis not present

## 2016-05-28 DIAGNOSIS — R7881 Bacteremia: Secondary | ICD-10-CM | POA: Diagnosis present

## 2016-05-28 DIAGNOSIS — R001 Bradycardia, unspecified: Secondary | ICD-10-CM | POA: Diagnosis not present

## 2016-05-28 DIAGNOSIS — R410 Disorientation, unspecified: Secondary | ICD-10-CM | POA: Diagnosis not present

## 2016-05-28 DIAGNOSIS — Z79899 Other long term (current) drug therapy: Secondary | ICD-10-CM

## 2016-05-28 DIAGNOSIS — R680 Hypothermia, not associated with low environmental temperature: Secondary | ICD-10-CM | POA: Diagnosis present

## 2016-05-28 DIAGNOSIS — M6281 Muscle weakness (generalized): Secondary | ICD-10-CM | POA: Diagnosis not present

## 2016-05-28 DIAGNOSIS — R339 Retention of urine, unspecified: Secondary | ICD-10-CM | POA: Diagnosis present

## 2016-05-28 DIAGNOSIS — R4182 Altered mental status, unspecified: Secondary | ICD-10-CM | POA: Diagnosis not present

## 2016-05-28 DIAGNOSIS — A4189 Other specified sepsis: Secondary | ICD-10-CM | POA: Diagnosis not present

## 2016-05-28 DIAGNOSIS — E119 Type 2 diabetes mellitus without complications: Secondary | ICD-10-CM | POA: Diagnosis present

## 2016-05-28 DIAGNOSIS — A419 Sepsis, unspecified organism: Secondary | ICD-10-CM | POA: Diagnosis not present

## 2016-05-28 DIAGNOSIS — G9349 Other encephalopathy: Secondary | ICD-10-CM | POA: Diagnosis not present

## 2016-05-28 DIAGNOSIS — F209 Schizophrenia, unspecified: Secondary | ICD-10-CM | POA: Diagnosis present

## 2016-05-28 DIAGNOSIS — B957 Other staphylococcus as the cause of diseases classified elsewhere: Secondary | ICD-10-CM | POA: Diagnosis present

## 2016-05-28 DIAGNOSIS — R1310 Dysphagia, unspecified: Secondary | ICD-10-CM | POA: Diagnosis not present

## 2016-05-28 DIAGNOSIS — Z781 Physical restraint status: Secondary | ICD-10-CM

## 2016-05-28 DIAGNOSIS — Z1623 Resistance to quinolones and fluoroquinolones: Secondary | ICD-10-CM | POA: Diagnosis present

## 2016-05-28 DIAGNOSIS — F79 Unspecified intellectual disabilities: Secondary | ICD-10-CM

## 2016-05-28 DIAGNOSIS — F71 Moderate intellectual disabilities: Secondary | ICD-10-CM | POA: Diagnosis not present

## 2016-05-28 DIAGNOSIS — D649 Anemia, unspecified: Secondary | ICD-10-CM | POA: Diagnosis present

## 2016-05-28 DIAGNOSIS — N390 Urinary tract infection, site not specified: Secondary | ICD-10-CM | POA: Diagnosis not present

## 2016-05-28 DIAGNOSIS — A412 Sepsis due to unspecified staphylococcus: Secondary | ICD-10-CM | POA: Diagnosis not present

## 2016-05-28 DIAGNOSIS — G9340 Encephalopathy, unspecified: Secondary | ICD-10-CM | POA: Diagnosis present

## 2016-05-28 DIAGNOSIS — R0902 Hypoxemia: Secondary | ICD-10-CM | POA: Diagnosis not present

## 2016-05-28 DIAGNOSIS — A4151 Sepsis due to Escherichia coli [E. coli]: Secondary | ICD-10-CM | POA: Diagnosis not present

## 2016-05-28 DIAGNOSIS — R278 Other lack of coordination: Secondary | ICD-10-CM | POA: Diagnosis not present

## 2016-05-28 DIAGNOSIS — R41841 Cognitive communication deficit: Secondary | ICD-10-CM | POA: Diagnosis not present

## 2016-05-28 DIAGNOSIS — I10 Essential (primary) hypertension: Secondary | ICD-10-CM | POA: Diagnosis not present

## 2016-05-28 LAB — CBC WITH DIFFERENTIAL/PLATELET
BASOS ABS: 0 10*3/uL (ref 0.0–0.1)
Basophils Relative: 0 %
EOS ABS: 0.1 10*3/uL (ref 0.0–0.7)
Eosinophils Relative: 2 %
HCT: 29.3 % — ABNORMAL LOW (ref 39.0–52.0)
HEMOGLOBIN: 10.3 g/dL — AB (ref 13.0–17.0)
LYMPHS ABS: 0.8 10*3/uL (ref 0.7–4.0)
LYMPHS PCT: 13 %
MCH: 32.9 pg (ref 26.0–34.0)
MCHC: 35.2 g/dL (ref 30.0–36.0)
MCV: 93.6 fL (ref 78.0–100.0)
Monocytes Absolute: 0.2 10*3/uL (ref 0.1–1.0)
Monocytes Relative: 4 %
NEUTROS ABS: 4.8 10*3/uL (ref 1.7–7.7)
Neutrophils Relative %: 81 %
Platelets: 243 10*3/uL (ref 150–400)
RBC: 3.13 MIL/uL — AB (ref 4.22–5.81)
RDW: 14.8 % (ref 11.5–15.5)
WBC: 5.9 10*3/uL (ref 4.0–10.5)

## 2016-05-28 LAB — BLOOD GAS, ARTERIAL
ACID-BASE EXCESS: 5 mmol/L — AB (ref 0.0–2.0)
BICARBONATE: 29.7 mmol/L — AB (ref 20.0–28.0)
Drawn by: 232811
O2 SAT: 96.7 %
PH ART: 7.436 (ref 7.350–7.450)
Patient temperature: 96.7
pCO2 arterial: 44.3 mmHg (ref 32.0–48.0)
pO2, Arterial: 90.9 mmHg (ref 83.0–108.0)

## 2016-05-28 LAB — COMPREHENSIVE METABOLIC PANEL
ALBUMIN: 2.8 g/dL — AB (ref 3.5–5.0)
ALK PHOS: 81 U/L (ref 38–126)
ALT: 64 U/L — AB (ref 17–63)
ANION GAP: 5 (ref 5–15)
AST: 153 U/L — AB (ref 15–41)
BILIRUBIN TOTAL: 0.4 mg/dL (ref 0.3–1.2)
BUN: 25 mg/dL — AB (ref 6–20)
CALCIUM: 8.9 mg/dL (ref 8.9–10.3)
CO2: 30 mmol/L (ref 22–32)
Chloride: 107 mmol/L (ref 101–111)
Creatinine, Ser: 1.07 mg/dL (ref 0.61–1.24)
GFR calc Af Amer: >60 mL/min (ref >60)
GFR calc non Af Amer: >60 mL/min (ref >60)
GLUCOSE: 99 mg/dL (ref 65–99)
Potassium: 3.7 mmol/L (ref 3.5–5.1)
SODIUM: 142 mmol/L (ref 135–145)
Total Protein: 5.9 g/dL — ABNORMAL LOW (ref 6.5–8.1)

## 2016-05-28 LAB — URINALYSIS, ROUTINE W REFLEX MICROSCOPIC
BACTERIA UA: NONE SEEN
BILIRUBIN URINE: NEGATIVE
Glucose, UA: NEGATIVE mg/dL
Ketones, ur: NEGATIVE mg/dL
Leukocytes, UA: NEGATIVE
NITRITE: NEGATIVE
PH: 6 (ref 5.0–8.0)
Protein, ur: 100 mg/dL — AB
SPECIFIC GRAVITY, URINE: 1.015 (ref 1.005–1.030)

## 2016-05-28 LAB — PROTIME-INR
INR: 1.15
PROTHROMBIN TIME: 14.7 s (ref 11.4–15.2)

## 2016-05-28 LAB — AMMONIA: Ammonia: 27 umol/L (ref 9–35)

## 2016-05-28 LAB — ABO/RH: ABO/RH(D): O POS

## 2016-05-28 LAB — TYPE AND SCREEN
ABO/RH(D): O POS
ANTIBODY SCREEN: NEGATIVE

## 2016-05-28 LAB — MAGNESIUM: MAGNESIUM: 1.9 mg/dL (ref 1.7–2.4)

## 2016-05-28 LAB — LACTIC ACID, PLASMA: Lactic Acid, Venous: 0.8 mmol/L (ref 0.5–1.9)

## 2016-05-28 LAB — TROPONIN I

## 2016-05-28 MED ORDER — ACETAMINOPHEN 325 MG PO TABS: 650.0000 mg | ORAL_TABLET | Freq: Four times a day (QID) | ORAL | Status: DC | PRN

## 2016-05-28 MED ORDER — BENZTROPINE MESYLATE 0.5 MG PO TABS
0.5000 mg | ORAL_TABLET | Freq: Every day | ORAL | Status: DC
  Administered 2016-05-29 – 2016-06-04 (×7): 0.5 mg via ORAL
  Filled 2016-05-28 (×7): qty 1

## 2016-05-28 MED ORDER — DEXTROSE 5 % IV SOLN
1.0000 g | INTRAVENOUS | Status: DC
  Administered 2016-05-29 – 2016-06-01 (×4): 1 g via INTRAVENOUS
  Filled 2016-05-28 (×4): qty 10

## 2016-05-28 MED ORDER — SODIUM CHLORIDE 0.9% FLUSH
3.0000 mL | Freq: Two times a day (BID) | INTRAVENOUS | Status: DC
  Administered 2016-05-29 – 2016-06-03 (×4): 3 mL via INTRAVENOUS

## 2016-05-28 MED ORDER — PANTOPRAZOLE SODIUM 40 MG PO TBEC
40.0000 mg | DELAYED_RELEASE_TABLET | Freq: Every day | ORAL | Status: DC
  Administered 2016-05-29 – 2016-06-04 (×7): 40 mg via ORAL
  Filled 2016-05-28 (×7): qty 1

## 2016-05-28 MED ORDER — SODIUM CHLORIDE 0.9 % IV BOLUS (SEPSIS)
1000.0000 mL | Freq: Once | INTRAVENOUS | Status: AC
  Administered 2016-05-28: 1000 mL via INTRAVENOUS

## 2016-05-28 MED ORDER — ASENAPINE MALEATE 5 MG SL SUBL
5.0000 mg | SUBLINGUAL_TABLET | Freq: Two times a day (BID) | SUBLINGUAL | Status: DC | PRN
  Filled 2016-05-28: qty 1

## 2016-05-28 MED ORDER — DEXTROSE 5 % IV SOLN
1.0000 g | Freq: Once | INTRAVENOUS | Status: AC
  Administered 2016-05-28: 1 g via INTRAVENOUS
  Filled 2016-05-28: qty 10

## 2016-05-28 MED ORDER — LAMOTRIGINE 100 MG PO TABS
100.0000 mg | ORAL_TABLET | Freq: Two times a day (BID) | ORAL | Status: DC
  Administered 2016-05-29 – 2016-06-04 (×13): 100 mg via ORAL
  Filled 2016-05-28 (×13): qty 1

## 2016-05-28 MED ORDER — SODIUM CHLORIDE 0.9 % IV SOLN
INTRAVENOUS | Status: DC
  Administered 2016-05-28 – 2016-06-02 (×9): via INTRAVENOUS

## 2016-05-28 MED ORDER — ENOXAPARIN SODIUM 40 MG/0.4ML ~~LOC~~ SOLN
40.0000 mg | SUBCUTANEOUS | Status: DC
  Administered 2016-05-28 – 2016-06-03 (×7): 40 mg via SUBCUTANEOUS
  Filled 2016-05-28 (×8): qty 0.4

## 2016-05-28 MED ORDER — HALOPERIDOL 5 MG PO TABS
5.0000 mg | ORAL_TABLET | Freq: Two times a day (BID) | ORAL | Status: DC
  Administered 2016-05-29 – 2016-06-04 (×13): 5 mg via ORAL
  Filled 2016-05-28 (×15): qty 1

## 2016-05-28 NOTE — Clinical Social Work Note (Signed)
Clinical Social Work Assessment  Patient Details  Name: Antonio Montoya MRN: [id number] Date of Birth: 03-18-1950  Date of referral:  05/28/16               Reason for consult:  Facility Placement, Family Concerns                Permission sought to share information with:  Facility Medical sales representativeContact Representative, Family Supports Permission granted to share information::  Yes, Verbal Permission Granted  Name::        Agency::     Relationship::     Contact Information:     Housing/Transportation Living arrangements for the past 2 months:   (Group Home) Source of Information:  Other (Comment Required) (Sibling/Legal Guardian) Patient Interpreter Needed:  None Criminal Activity/Legal Involvement Pertinent to Current Situation/Hospitalization:    Significant Relationships:  Siblings Lives with:    Do you feel safe going back to the place where you live?  Yes Need for family participation in patient care:  Yes (Comment)  Care giving concerns:  None listed by pt/family   Social Worker assessment / plan:  CSW spoke with pt's sister who is also the pt's legal guardian Antonio Montoya at [phone] and [phone].  Pt's sister/guardian stated pt is assisted by a Child psychotherapistsocial worker at [facility] [employer] names either Marchelle Folksmanda or Kim a Lucent TechnologiesCare Coordinator and that the pt lives with a caregiver named Anselm Lisnoch at his Group Home in [place] at [phone]. Pt's sister confirmed pt's sister's plan for the pt to be discharged back to his group home to live at discharge.  CSW provided active listening and validated pt's sister's concerns.  Pt has been living at his group home [facility]   Employment status:  Disabled (Comment on whether or not currently receiving Disability) (Mental Retardation / Futures traderchizophrienia) Insurance information:  [place]  Not assessed at this time Information / Referral to community resources:     Patient/Family's Response to  care:  Patient not alert and oriented.  Patient's sister agreeable to plan for pt to return to his group home.  Pt's sister supportive and strongly involved in pt.'s care.  Pt.'s sister pleasant and appreciated CSW intervention.   Patient/Family's Understanding of and Emotional Response to Diagnosis, Current Treatment, and Prognosis:  Still assessing   Emotional Assessment Appearance:    Attitude/Demeanor/Rapport:  Unable to Assess Affect (typically observed):  Unable to Assess Orientation:   (Pt is unresponsive, per the notes.) Alcohol / Substance use:    Psych involvement (Current and /or in the community):     Discharge Needs  Concerns to be addressed:  Basic Needs Readmission within the last 30 days:  Yes Current discharge risk:  Cognitively Impaired, Psychiatric Illness Barriers to Discharge:  No Barriers Identified   Mercy RidingJonathan F Lajoyce Tamura, LCSWA 05/28/2016, 9:37 PM

## 2016-05-28 NOTE — ED Notes (Signed)
Pt's caregiver:  Enock---- tel# [phone]

## 2016-05-28 NOTE — ED Notes (Signed)
Pt has been repositioned in bed with a bed alarm and waist safety belt due to waking up and becoming active in trying to get out of bed.

## 2016-05-28 NOTE — Progress Notes (Addendum)
CSW called pt's legal guardian Antonio Montoya who is also his sister at [phone] and [phone].  Pt's sister/guardian stated pt is assisted by a Child psychotherapistsocial worker at [facility] [employer] names either Antonio Folksmanda or Antonio Montoya a Luc[employer] TechnologiesCare Coordinator and that the pt lives with a caregiver named Antonio Montoya at his Group [place] in [place] at [phone].   CSW called pt's group home at [phone] and left a VM for Antonio Montoya who is the pt's caregiver.  Antonio Montoya, Antonio MajorsLCSWA, LCAS Clinical Social Worker Ph: [phone]

## 2016-05-28 NOTE — ED Notes (Signed)
Bed: [id number] Expected date:  Expected time:  Means of arrival:  Comments: [age] f altered mental status

## 2016-05-28 NOTE — ED Notes (Signed)
Patient is resting comfortably. 

## 2016-05-28 NOTE — ED Provider Notes (Signed)
[facility]-EMERGENCY DEPT Provider Note   CSN: [id number] Arrival date & time: 05/28/16  0209   By signing my name below, I, Antonio Montoya, attest that this documentation has been prepared under the direction and in the presence of Antonio DibblesJon Arneda Sappington, MD. Electronically signed, Antonio Montoya, ED Scribe. 05/28/16. 2:24 AM.   History   Chief Complaint Chief Complaint  Patient presents with  . Altered Mental Status   The history is provided by the patient and medical records. No language interpreter was used.    LEVEL 5 CAVEAT D/T ALTERED MENTAL STATUS  HPI Comments: Antonio Montoya is a 66 y.o. male with Hx of MR and Schizophrenia BIB EMS who presents to the Emergency Department for altered mental status. Pt recently hospitalized for UTI and discharged on 05/24/2016. Hx of recent hospitalizations for AMS and urinary retention noted. Nursing staff note the pt is normally responsive to verbal stimulus and ambulatory with shuffled gait. He reportedly became less responsive 05/26/2016. Caregivers noted that pt was weak and unable to tand.  Past Medical History:  Diagnosis Date  . MR (mental retardation)   . Schizophrenia [facility])     Patient Active Problem List   Diagnosis Date Noted  . Urinary retention 05/04/2016  . Thrombocytopenia (HCC) 05/04/2016  . Pressure injury of skin 05/02/2016  . Hypothermia 05/02/2016  . Elevated LFTs   . Acute delirium 05/01/2016    Past Surgical History:  Procedure Laterality Date  . CYSTOSCOPY N/A 05/18/2016   Procedure: CYSTOSCOPY;  Surgeon: [name], MD;  Location: [facility] ORS;  Service: Urology;  Laterality: N/A;  . TRANSURETHRAL RESECTION OF PROSTATE N/A 05/18/2016   Procedure: TRANSURETHRAL RESECTION OF THE PROSTATE (TURP);  Surgeon: [name], MD;  Location: [facility] ORS;  Service: Urology;  Laterality: N/A;       Home Medications    Prior to Admission medications   Medication Sig Start Date End Date Taking? Authorizing Provider  acetaminophen (TYLENOL) 325  MG tablet Take 2 tablets (650 mg total) by mouth every 6 (six) hours as needed for mild pain (or Fever >/= 101). 05/19/16   Calvert CantorSaima Rizwan, MD  asenapine (SAPHRIS) 5 MG SUBL 24 hr tablet Place 1 tablet (5 mg total) under the tongue every 12 (twelve) hours as needed (for agitation). 05/19/16   Calvert CantorSaima Rizwan, MD  benztropine (COGENTIN) 0.5 MG tablet Take 1 tablet (0.5 mg total) by mouth daily. 05/24/16   [name], MD  haloperidol (HALDOL) 5 MG tablet Take 1 tablet (5 mg total) by mouth 2 (two) times daily. 05/24/16   [name], MD  HYDROcodone-acetaminophen (NORCO) 10-325 MG tablet Take 1-2 tablets by mouth every 4 (four) hours as needed for moderate pain. Maximum dose per 24 hours -8 pills. 05/18/16   [name], MD  lamoTRIgine (LAMICTAL) 100 MG tablet Take 1 tablet (100 mg total) by mouth 2 (two) times daily. 05/24/16   [name], MD  LORazepam (ATIVAN) 0.5 MG tablet Take 1 tablet (0.5 mg total) by mouth 2 (two) times daily. 05/24/16   [name], MD  LORazepam (ATIVAN) 1 MG tablet Take 1 tablet (1 mg total) by mouth every 6 (six) hours as needed for anxiety. 05/24/16   [name], MD  omeprazole (PRILOSEC) 20 MG capsule Take 20 mg by mouth every morning.     Historical Provider, MD  potassium chloride (K-DUR,KLOR-CON) 10 MEQ tablet Take 10 mEq by mouth every morning.    Historical Provider, MD    Family History Family History  Problem Relation Age  of Onset  . Family history unknown: Yes    Social History Social History  Substance Use Topics  . Smoking status: Never Smoker  . Smokeless tobacco: Never Used  . Alcohol use No     Allergies   Patient has no known allergies.   Review of Systems Review of Systems  Unable to perform ROS: Mental status change  All other systems reviewed and are negative.  A complete 10 system review of systems was obtained and all systems are negative except as noted in the HPI and PMH.    Physical Exam Updated  Vital Signs BP 115/81   Pulse (!) 52   Temp (!) 96.7 F (35.9 C) (Axillary)   Resp 12   SpO2 97%   Physical Exam  Constitutional: He appears lethargic. No distress.  Elderly, somnolent  HENT:  Head: Normocephalic.  Right Ear: External ear normal.  Left Ear: External ear normal.  Old abrasions noted , no signs of acute injury   Eyes: Conjunctivae are normal. Right eye exhibits no discharge. Left eye exhibits no discharge. No scleral icterus.  Left pupil larger than the right  Neck: Neck supple. No tracheal deviation present.  Cardiovascular: Normal rate, regular rhythm and intact distal pulses.   Pulmonary/Chest: Effort normal and breath sounds normal. No stridor. No respiratory distress. He has no wheezes. He has no rales.  Abdominal: Soft. Bowel sounds are normal. He exhibits no distension. There is no tenderness. There is no rebound and no guarding.  Musculoskeletal: He exhibits edema. He exhibits no tenderness.  Bilateral feet and ankles, no cyanosis but extremities are cool  Neurological: He appears lethargic. He exhibits normal muscle tone. He displays no seizure activity.  Pt does not respond to voice or sternal rub, sonorous respirations  Skin: Skin is warm and dry. No rash noted.  Psychiatric: He has a normal mood and affect.  Nursing note and vitals reviewed.    ED Treatments / Results  DIAGNOSTIC STUDIES:   COORDINATION OF CARE: 2:24 AM Discussed treatment plan with pt at bedside and pt agreed to plan.  Labs (all labs ordered are listed, but only abnormal results are displayed) Labs Reviewed  COMPREHENSIVE METABOLIC PANEL - Abnormal; Notable for the following:       Result Value   BUN 25 (*)    Total Protein 5.9 (*)    Albumin 2.8 (*)    AST 153 (*)    ALT 64 (*)    All other components within normal limits  CBC WITH DIFFERENTIAL/PLATELET - Abnormal; Notable for the following:    RBC 3.13 (*)    Hemoglobin 10.3 (*)    HCT 29.3 (*)    All other  components within normal limits  URINALYSIS, ROUTINE W REFLEX MICROSCOPIC - Abnormal; Notable for the following:    Hgb urine dipstick LARGE (*)    Protein, ur 100 (*)    Squamous Epithelial / LPF 0-5 (*)    All other components within normal limits  BLOOD GAS, ARTERIAL - Abnormal; Notable for the following:    Bicarbonate 29.7 (*)    Acid-Base Excess 5.0 (*)    All other components within normal limits  URINE CULTURE  PROTIME-INR  AMMONIA  TROPONIN I  TYPE AND SCREEN  ABO/RH    EKG  EKG Interpretation  Date/Time:  Monday May 28 2016 02:30:42 EDT Ventricular Rate:  44 PR Interval:    QRS Duration: 111 QT Interval:  510 QTC Calculation: 437 R Axis:   -  16 Text Interpretation:  Sinus bradycardia Borderline left axis deviation RSR' in V1 or V2, probably normal variant Since last tracing rate slower Confirmed by Rosalea Withrow  MD-J, Shaquna Geigle [phone]) on 05/28/2016 2:34:23 AM       Radiology Dg Chest 1 View  Result Date: 05/28/2016 CLINICAL DATA:  66 year old male with altered mental status EXAM: CHEST 1 VIEW COMPARISON:  Chest radiograph dated 05/02/2016 FINDINGS: The heart size and mediastinal contours are within normal limits. Both lungs are clear. The visualized skeletal structures are unremarkable. IMPRESSION: No active disease. Electronically Signed   By: Elgie Collard M.D.   On: 05/28/2016 03:10   Ct Head Wo Contrast  Result Date: 05/28/2016 CLINICAL DATA:  66 year old male with altered mental status. EXAM: CT HEAD WITHOUT CONTRAST TECHNIQUE: Contiguous axial images were obtained from the base of the skull through the vertex without intravenous contrast. COMPARISON:  Head CT dated 04/13/2016 FINDINGS: Brain: The ventricles and sulci are appropriate in size for the patient's age. Mild periventricular and deep white matter chronic microvascular ischemic changes noted. There is no acute intracranial hemorrhage. No mass effect or midline shift. No extra-axial fluid collection. Vascular: No  hyperdense vessel or unexpected calcification. Skull: Normal. Negative for fracture or focal lesion. Sinuses/Orbits: Minimal mucoperiosteal thickening of paranasal sinuses. No air-fluid level. There is partial opacification of the left mastoid air cells. The right mastoid air cells are clear. Other: None IMPRESSION: 1. No acute intracranial hemorrhage. 2. Mild age-related atrophy and chronic microvascular ischemic changes. If symptoms persist, and there are no contraindications, MRI may provide better evaluation if clinically indicated. Electronically Signed   By: Elgie Collard M.D.   On: 05/28/2016 03:25    Procedures Procedures (including critical care time)  Medications Ordered in ED Medications  0.9 %  sodium chloride infusion ( Intravenous New Bag/Given 05/28/16 0333)  cefTRIAXone (ROCEPHIN) 1 g in dextrose 5 % 50 mL IVPB (not administered)     Initial Impression / Assessment and Plan / ED Course  I have reviewed the triage vital signs and the nursing notes.  Pertinent labs & imaging results that were available during my care of the patient were reviewed by me and considered in my medical decision making (see chart for details).  Clinical Course as of May 28 452  Mon May 28, 2016  0324 Anemia Stable compared to previous labs Hemoglobin: (!) 10.3 [Antonio Montoya]  0436 Old records reviewed.  6th visit to the ED since January.   Pt is living in a group home.     [Antonio Montoya]    Clinical Course User Index [Antonio Montoya] Antonio Dibbles, MD    Pt presents to the ED with recurrent altered mental status, increasing weakness.  UA suggests UTI.  This could be contributing to his MS changes.  Caregiver checked on patient and does not feel he can manage him.  Will consult with medical service for admission, abx, monitoring.   Social work consult while in the hospital will be helpful as pt has been coming to the ED frequently and his needs seem to be beyond the capability of the private caregiver.  Final Clinical  Impressions(s) / ED Diagnoses   Final diagnoses:  Urinary tract infection without hematuria, site unspecified    I personally performed the services described in this documentation, which was scribed in my presence.  The recorded information has been reviewed and is accurate.    Antonio Dibbles, MD 05/28/16 [phone]

## 2016-05-28 NOTE — ED Triage Notes (Signed)
Brought in by [facility] from home with c/o altered mental status.  Pt lives at home with personal/private caregiver.   Pt normally non-verbal but able to shuffle self to move around.  Per caregiver, pt has been observed to be more weak and altered since yesterday.  Pt has had recent hospitalization for UTI and was discharged home on the 15th.  Pt arrived to ED very lethargic--- responds only to painful stimuli.

## 2016-05-28 NOTE — ED Notes (Signed)
[facility] nurse called and they are cleaning room for patient shortly

## 2016-05-28 NOTE — H&P (Addendum)
History and Physical    Antonio Montoya ZOX:[id number] DOB: 1950-09-30 DOA: 05/28/2016  PCP: Shayne Alken, MD (Inactive)  Patient coming from: Home  I have personally briefly reviewed patient's old medical records in [facility]  Chief Complaint: altered mental status.   HPI: Antonio Montoya is a 66 y.o. male with medical history significant of mental retardation and schizophrenia, was brought to ED for lethargy, altered mental status. He is not responding to verbal cues or on sternal rub he would transiently groan. No care giver or family at bedside, called both the caregiver and his sister phone number and left messages to call back. Most of the history was from EDP notes. On arrival to ED he was found to be hypothermic, hypotensive, and hypoxic, initially tachycardic and then became bradycardic. His BP responded to fluid boluses and his bp parameters are 90/ 50's with MAP greater than 65.  Lab work is sig for mild anemia, hemoglobin of 10.3, normal creatinine, . Blood gas shows normal ph and normal po2 and pco2. CXR is negative for infection. UA shows numerous WBC's. CT head shows Mild age-related atrophy and chronic microvascular ischemic changes. He was referred to medical service for admission.   Review of Systems:    Past Medical History:  Diagnosis Date  . MR (mental retardation)   . Schizophrenia [facility])     Past Surgical History:  Procedure Laterality Date  . CYSTOSCOPY N/A 05/18/2016   Procedure: CYSTOSCOPY;  Surgeon: Ihor Gully, MD;  Location: [facility];  Service: Urology;  Laterality: N/A;  . TRANSURETHRAL RESECTION OF PROSTATE N/A 05/18/2016   Procedure: TRANSURETHRAL RESECTION OF THE PROSTATE (TURP);  Surgeon: Ihor Gully, MD;  Location: [facility];  Service: Urology;  Laterality: N/A;     reports that he has never smoked. He has never used smokeless tobacco. He reports that he does not drink alcohol or use drugs.  No Known Allergies  Family History  Problem Relation Age of  Onset  . Family history unknown: Yes   Unknown family history  Prior to Admission medications   Medication Sig Start Date End Date Taking? Authorizing Provider  acetaminophen (TYLENOL) 325 MG tablet Take 2 tablets (650 mg total) by mouth every 6 (six) hours as needed for mild pain (or Fever >/= 101). 05/19/16  Yes Calvert Cantor, MD  asenapine (SAPHRIS) 5 MG SUBL 24 hr tablet Place 1 tablet (5 mg total) under the tongue every 12 (twelve) hours as needed (for agitation). 05/19/16  Yes Calvert Cantor, MD  benztropine (COGENTIN) 0.5 MG tablet Take 1 tablet (0.5 mg total) by mouth daily. 05/24/16  Yes Rhetta Mura, MD  haloperidol (HALDOL) 5 MG tablet Take 1 tablet (5 mg total) by mouth 2 (two) times daily. 05/24/16  Yes Rhetta Mura, MD  lamoTRIgine (LAMICTAL) 100 MG tablet Take 1 tablet (100 mg total) by mouth 2 (two) times daily. 05/24/16  Yes Rhetta Mura, MD  LORazepam (ATIVAN) 0.5 MG tablet Take 1 tablet (0.5 mg total) by mouth 2 (two) times daily. 05/24/16  Yes Rhetta Mura, MD  LORazepam (ATIVAN) 1 MG tablet Take 1 tablet (1 mg total) by mouth every 6 (six) hours as needed for anxiety. 05/24/16  Yes Rhetta Mura, MD  omeprazole (PRILOSEC) 20 MG capsule Take 20 mg by mouth every morning.    Yes Historical Provider, MD  potassium chloride (K-DUR,KLOR-CON) 10 MEQ tablet Take 10 mEq by mouth every morning.   Yes Historical Provider, MD  HYDROcodone-acetaminophen (NORCO) 10-325 MG tablet Take 1-2 tablets by  mouth every 4 (four) hours as needed for moderate pain. Maximum dose per 24 hours -8 pills. 05/18/16   [name], MD    Physical Exam: Vitals:   05/28/16 0600 05/28/16 0615 05/28/16 0630 05/28/16 0645  BP: 112/60  (!) 108/58 (!) 108/58  Pulse:  (!) 46  (!) 52  Resp: 13 13 16 15   Temp:      TempSrc:      SpO2:  100%  100%    Constitutional: NAD, calm, comfortable Vitals:   05/28/16 0600 05/28/16 0615 05/28/16 0630 05/28/16 0645  BP: 112/60  (!) 108/58 (!)  108/58  Pulse:  (!) 46  (!) 52  Resp: 13 13 16 15   Temp:      TempSrc:      SpO2:  100%  100%   Eyes: PERRL,  ENMT: Mucous membranes are moist.  Neck: normal, supple, no masses, no thyromegaly Respiratory: clear to auscultation bilaterally, no wheezing, no crackles. Normal respiratory effort. No accessory muscle use.  Cardiovascular: bradycardia, irregular. 2+ pedal pulses. No carotid bruits.  Abdomen: no tenderness, no masses palpated. No hepatosplenomegaly. Bowel sounds positive.  Musculoskeletal: no clubbing / cyanosis. Trace edema. Skin: no rashes, lesions, ulcers. No induration Neurologic: . Unresponsive,. Not responding to sternal rub or verbal cues.    Labs on Admission: I have personally reviewed following labs and imaging studies  CBC:  Recent Labs Lab 05/28/16 0241  WBC 5.9  NEUTROABS 4.8  HGB 10.3*  HCT 29.3*  MCV 93.6  PLT 243   Basic Metabolic Panel:  Recent Labs Lab 05/23/16 0539 05/28/16 0241  NA 141 142  K 3.9 3.7  CL 104 107  CO2 29 30  GLUCOSE 91 99  BUN 18 25*  CREATININE 1.14 1.07  CALCIUM 9.9 8.9   GFR: CrCl cannot be calculated (Unknown ideal weight.). Liver Function Tests:  Recent Labs Lab 05/28/16 0241  AST 153*  ALT 64*  ALKPHOS 81  BILITOT 0.4  PROT 5.9*  ALBUMIN 2.8*   No results for input(s): LIPASE, AMYLASE in the last 168 hours.  Recent Labs Lab 05/28/16 0241  AMMONIA 27   Coagulation Profile:  Recent Labs Lab 05/28/16 0241  INR 1.15   Cardiac Enzymes:  Recent Labs Lab 05/28/16 0241  TROPONINI <0.03   BNP (last 3 results) No results for input(s): PROBNP in the last 8760 hours. HbA1C: No results for input(s): HGBA1C in the last 72 hours. CBG: No results for input(s): GLUCAP in the last 168 hours. Lipid Profile: No results for input(s): CHOL, HDL, LDLCALC, TRIG, CHOLHDL, LDLDIRECT in the last 72 hours. Thyroid Function Tests: No results for input(s): TSH, T4TOTAL, FREET4, T3FREE, THYROIDAB in the  last 72 hours. Anemia Panel: No results for input(s): VITAMINB12, FOLATE, FERRITIN, TIBC, IRON, RETICCTPCT in the last 72 hours. Urine analysis:    Component Value Date/Time   COLORURINE YELLOW 05/28/2016 0406   APPEARANCEUR CLEAR 05/28/2016 0406   LABSPEC 1.015 05/28/2016 0406   PHURINE 6.0 05/28/2016 0406   GLUCOSEU NEGATIVE 05/28/2016 0406   HGBUR LARGE (A) 05/28/2016 0406   BILIRUBINUR NEGATIVE 05/28/2016 0406   KETONESUR NEGATIVE 05/28/2016 0406   PROTEINUR 100 (A) 05/28/2016 0406   UROBILINOGEN 1.0 01/16/2012 1957   NITRITE NEGATIVE 05/28/2016 0406   LEUKOCYTESUR NEGATIVE 05/28/2016 0406    Radiological Exams on Admission: Dg Chest 1 View  Result Date: 05/28/2016 CLINICAL DATA:  66 year old male with altered mental status EXAM: CHEST 1 VIEW COMPARISON:  Chest radiograph dated 05/02/2016 FINDINGS: The heart  size and mediastinal contours are within normal limits. Both lungs are clear. The visualized skeletal structures are unremarkable. IMPRESSION: No active disease. Electronically Signed   By: Elgie Collard M.D.   On: 05/28/2016 03:10   Ct Head Wo Contrast  Result Date: 05/28/2016 CLINICAL DATA:  66 year old male with altered mental status. EXAM: CT HEAD WITHOUT CONTRAST TECHNIQUE: Contiguous axial images were obtained from the base of the skull through the vertex without intravenous contrast. COMPARISON:  Head CT dated 04/13/2016 FINDINGS: Brain: The ventricles and sulci are appropriate in size for the patient's age. Mild periventricular and deep white matter chronic microvascular ischemic changes noted. There is no acute intracranial hemorrhage. No mass effect or midline shift. No extra-axial fluid collection. Vascular: No hyperdense vessel or unexpected calcification. Skull: Normal. Negative for fracture or focal lesion. Sinuses/Orbits: Minimal mucoperiosteal thickening of paranasal sinuses. No air-fluid level. There is partial opacification of the left mastoid air cells. The  right mastoid air cells are clear. Other: None IMPRESSION: 1. No acute intracranial hemorrhage. 2. Mild age-related atrophy and chronic microvascular ischemic changes. If symptoms persist, and there are no contraindications, MRI may provide better evaluation if clinically indicated. Electronically Signed   By: Elgie Collard M.D.   On: 05/28/2016 03:25    EKG: Independently reviewed. Tachycardia, multiform ventricular premature complexes  Assessment/Plan Active Problems:   Sepsis (HCC)   Altered mental status   Acute encephalopathy: of unclear etiology.  Suspect from polypharmacy vs sepsis, as he came in hypotensive, tachycardic, hypothermic. Lactic acid levels are pending.  UA shows wbc, negative leukocytes and nitrites. One dose of rocephin given, and blood and urine cultures done.  CT head shows chronic microvascular ischemic changes, MRI brain ordered for further evaluation.  Called patient's caregiver's number and his sister's number and left multiple messages to call back.  Fluid boluses and maintenance fluids to keep MAP >65.  IV rocephin for possible UTI.  Admit to step down for further evaluation.    Schizophrenia:  Currently will hold all meds, until his mental status improves. UDS ordered.     Bradycardia; he appears to be asymptomatic. TSH ordered and will request cardiology consult for recommendations.  Suspect from polypharmacy.     DVT prophylaxis: lovenox. Code Status: presumed full code.  Family Communication: none at bedside, called the care giver at [phone] and sisters number at [phone], [phone] Disposition Plan: to be determined Consults called: none Admission status: inpatient, SDU   Zyan Mirkin MD [facility]  Pager [phone]  If 7PM-7AM, please contact night-coverage [email or web address] Password [facility]  05/28/2016, 8:44 AM

## 2016-05-28 NOTE — Evaluation (Signed)
SLP Cancellation Note  Patient Details Name: Mart PiggsCharlie Sprong MRN: [id number] DOB: 08-21-1950   Cancelled treatment:       Reason Eval/Treat Not Completed:  (per chart review pt unresponsive to sternal rub by md this am, will attempt next am swallow evaluation)   Mills KollerKimball, Airika Alkhatib Ann Maher Shon, MS [facility] SLP [phone]

## 2016-05-29 DIAGNOSIS — R001 Bradycardia, unspecified: Secondary | ICD-10-CM

## 2016-05-29 DIAGNOSIS — R410 Disorientation, unspecified: Secondary | ICD-10-CM

## 2016-05-29 LAB — TSH: TSH: 0.892 u[IU]/mL (ref 0.350–4.500)

## 2016-05-29 MED ORDER — RESOURCE THICKENUP CLEAR PO POWD
ORAL | Status: DC | PRN
  Filled 2016-05-29 (×2): qty 125

## 2016-05-29 NOTE — Evaluation (Signed)
Clinical/Bedside Swallow Evaluation Patient Details  Name: Antonio Montoya MRN: [id number] Date of Birth: 27-Nov-1950  Today's Date: 05/29/2016 Time: SLP Start Time (ACUTE ONLY): 1032 SLP Stop Time (ACUTE ONLY): 1115 SLP Time Calculation (min) (ACUTE ONLY): 43 min  Past Medical History:  Past Medical History:  Diagnosis Date  . MR (mental retardation)   . Schizophrenia [facility](HCC)    Past Surgical History:  Past Surgical History:  Procedure Laterality Date  . CYSTOSCOPY N/A 05/18/2016   Procedure: CYSTOSCOPY;  Surgeon: [name], MD;  Location: [facility];  Service: Urology;  Laterality: N/A;  . TRANSURETHRAL RESECTION OF PROSTATE N/A 05/18/2016   Procedure: TRANSURETHRAL RESECTION OF THE PROSTATE (TURP);  Surgeon: [name], MD;  Location: [facility];  Service: Urology;  Laterality: N/A;   HPI:  66 [facility].  Pt PMH + for schizophrenia, MR.  Pt imaging studies were negative - CT head, CXR.  Swallow evaluation ordered.     Assessment / Plan / Recommendation Clinical Impression  Pt presents with clinical signs of oropharyngeal dysphagia - including decreased oral coordination, lingual thrusting, labial spillage and suspected premature spillage of boluses into pharynx.  Pt with excessive secretions causing drooling.  Overt indication of aspiration with 75% of thin liquid boluses - c/b immediate cough/wet voice presumed due to spillage into pharynx/larynx.  No indication of aspiration with tsp thin water, nectar, puree nor solids.  Pt is impulsive and takes very large boluses increasing aspiration risk.  Recommend dys2/nectar and allow tsps water between meals.  SLP to follow up to determine readiness for dietary advancement, indication for further testing. Skilled intervention included determining indication diet modifications and strategies.  Pt looks well nourished and note all CXRs negative in system- suspect chronic dysphagia due to MR.   SLP Visit Diagnosis: Dysphagia,  oropharyngeal phase (R13.12)    Aspiration Risk  Moderate aspiration risk    Diet Recommendation Dysphagia 2 (Fine chop);Nectar-thick liquid (tsps thin water between meals)   Liquid Administration via: Cup Medication Administration: Whole meds with puree Supervision: Patient able to self feed;Full supervision/cueing for compensatory strategies;Staff to assist with self feeding Compensations: Minimize environmental distractions;Slow rate;Small sips/bites Postural Changes: Remain upright for at least 30 minutes after po intake;Seated upright at 90 degrees    Other  Recommendations Oral Care Recommendations: Oral care BID Other Recommendations: Order thickener from pharmacy   Follow up Recommendations        Frequency and Duration min 2x/week  1 week       Prognosis Prognosis for Safe Diet Advancement: Fair Barriers to Reach Goals: Time post onset;Behavior      Swallow Study   General Date of Onset: 05/29/16 HPI: 66 [facility].  Pt PMH + for schizophrenia, MR.  Pt imaging studies were negative - CT head, CXR.  Swallow evaluation ordered.   Type of Study: Bedside Swallow Evaluation Previous Swallow Assessment: no prior evaluations in the system Diet Prior to this Study: Dysphagia 2 (chopped);Thin liquids Temperature Spikes Noted: No Respiratory Status: Room air History of Recent Intubation: No Behavior/Cognition: Alert;Doesn't follow directions;Impulsive Oral Cavity Assessment: Excessive secretions Oral Care Completed by SLP: Yes Oral Cavity - Dentition: Edentulous Vision: Functional for self-feeding Self-Feeding Abilities: Able to feed self;Needs assist Patient Positioning: Upright in bed Baseline Vocal Quality: Hoarse;Low vocal intensity Volitional Cough: Cognitively unable to elicit Volitional Swallow: Unable to elicit    Oral/Motor/Sensory Function Overall Oral Motor/Sensory Function: Moderate impairment (difficult to assess, pt  did not sit  neutral, labial loss right, decreased consistent labial closure, able to seal on straw)   Ice Chips Ice chips: Within functional limits Presentation: Spoon   Thin Liquid Thin Liquid: Impaired Presentation: Cup;Straw;Self Fed;Spoon Oral Phase Impairments: Reduced labial seal;Reduced lingual movement/coordination Oral Phase Functional Implications: Right anterior spillage Pharyngeal  Phase Impairments: Cough - Immediate Other Comments: cough with 75% of boluses via cup,straw - not overt indication of aspiration with water via tsp    Nectar Thick Nectar Thick Liquid: Impaired Presentation: Cup;Self Fed;Spoon;Straw Oral Phase Impairments: Reduced labial seal;Reduced lingual movement/coordination Oral phase functional implications: Right anterior spillage   Honey Thick Honey Thick Liquid: Not tested   Puree Puree: Impaired Presentation: Spoon Oral Phase Impairments: Reduced labial seal;Reduced lingual movement/coordination Oral Phase Functional Implications: Oral residue;Right lateral sulci pocketing   Solid   GO   Solid: Impaired Presentation: Self Fed Oral Phase Impairments: Reduced labial seal;Reduced lingual movement/coordination;Impaired mastication Oral Phase Functional Implications: Prolonged oral transit;Other (comment);Right lateral sulci pocketing        Donavan Burnet, MS [facility] SLP [phone]

## 2016-05-29 NOTE — Consult Note (Signed)
Cardiology Consult    Patient ID: Antonio Montoya MRN: [id number], DOB/AGE: 582-27-1952   Admit date: 05/28/2016 Date of Consult: 05/29/2016  Primary Physician: Antonio AlkenEGE,HILMI, MD (Inactive) Primary Cardiologist: New Requesting Provider: Blake Montoya Reason for Consultation: Bradycardia  Patient Profile    66 yo male with PMH of MR, and schizophrenia who presented with lethargy and altered mental status. Found to have bradycardia.   Past Medical History   Past Medical History:  Diagnosis Date  . MR (mental retardation)   . Schizophrenia [facility](HCC)     Past Surgical History:  Procedure Laterality Date  . CYSTOSCOPY N/A 05/18/2016   Procedure: CYSTOSCOPY;  Surgeon: [name], MD;  Location: [facility];  Service: Urology;  Laterality: N/A;  . TRANSURETHRAL RESECTION OF PROSTATE N/A 05/18/2016   Procedure: TRANSURETHRAL RESECTION OF THE PROSTATE (TURP);  Surgeon: [name], MD;  Location: [facility];  Service: Urology;  Laterality: N/A;     Allergies  No Known Allergies  History of Present Illness    Antonio Montoya is a 66 yo male with PMH of MR, and schizophrenia. His history has been obtained from the chart as he is basically nonverbal. He currently lives at home with a private caregiver, generally able to walk around the home, but to totally dependent for his ADLs. Was recently hospitalized about a week ago for a UTI. Care giver reported he appeared more lethargic and altered beyond his baseline.   In the ED he was very lethargic, would not respond to verbal cues or sternal rub but would groan. He was found to be hypothermic, hypotensive and initally tachycardic then bradycardic. He was given IVF boluses that improved his blood pressure. Labs showed stable electrolytes, Cr 1.07, albumin 2.8, AST 153, ALT 64, Hgb 10.3. CT head negative for acute bleed but showed chronic microvascular changes, CXR negative. EKG showed SB no acute ST/T wave abnormalities. UA showed too numerous WBCs. He was admitted for IV  antibiotics.   Inpatient Medications    . benztropine  0.5 mg Oral Daily  . cefTRIAXone (ROCEPHIN)  IV  1 g Intravenous Q24H  . enoxaparin (LOVENOX) injection  40 mg Subcutaneous Q24H  . haloperidol  5 mg Oral BID  . lamoTRIgine  100 mg Oral BID  . pantoprazole  40 mg Oral Daily  . sodium chloride flush  3 mL Intravenous Q12H    Family History    Family History  Problem Relation Age of Onset  . Family history unknown: Yes    Social History    Social History   Social History  . Marital status: Single    Spouse name: N/A  . Number of children: N/A  . Years of education: N/A   Occupational History  . Not on file.   Social History Main Topics  . Smoking status: Never Smoker  . Smokeless tobacco: Never Used  . Alcohol use No  . Drug use: No  . Sexual activity: No   Other Topics Concern  . Not on file   Social History Narrative  . No narrative on file     Review of Systems  Obtained from Chart:  General:  No chills, fever Cardiovascular:  No chest pain, dyspnea on exertion Dermatological: No rash, lesions/masses Respiratory: No cough, dyspnea Urologic: No hematuria, dysuria Abdominal:   No nausea, vomiting, diarrhea, bright red blood per rectum, melena, or hematemesis Neurologic:  ++ wkns, ++ changes in mental status. All other systems reviewed and are otherwise negative except as noted above.  Physical  Exam    Blood pressure 101/60, pulse (!) 59, temperature 98.4 F (36.9 C), temperature source Oral, resp. rate 15, height 6' (1.829 m), weight 158 lb 11.7 oz (72 kg), SpO2 100 %.  General: Pleasant nonverbal AAM, NAD Psych: MR Neuro: Alert to self. Moves all extremities spontaneously. HEENT: Normal  Neck: Supple without bruits or JVD. Lungs:  Resp regular and unlabored, CTA. Heart: RRR no s3, s4, or murmurs. Abdomen: Soft, non-tender, non-distended, BS + x 4.  Extremities: No clubbing, cyanosis or edema. DP/PT/Radials 2+ and equal bilaterally. Wearing  mitten restraints.  Labs    Troponin (Point of Care Test) No results for input(s): TROPIPOC in the last 72 hours.  Recent Labs  05/28/16 0241  TROPONINI <0.03   Lab Results  Component Value Date   WBC 5.9 05/28/2016   HGB 10.3 (L) 05/28/2016   HCT 29.3 (L) 05/28/2016   MCV 93.6 05/28/2016   PLT 243 05/28/2016    Recent Labs Lab 05/28/16 0241  NA 142  K 3.7  CL 107  CO2 30  BUN 25*  CREATININE 1.07  CALCIUM 8.9  PROT 5.9*  BILITOT 0.4  ALKPHOS 81  ALT 64*  AST 153*  GLUCOSE 99   No results found for: CHOL, HDL, LDLCALC, TRIG No results found for: [facility]   Radiology Studies    Dg Chest 1 View  Result Date: 05/28/2016 CLINICAL DATA:  66 year old male with altered mental status EXAM: CHEST 1 VIEW COMPARISON:  Chest radiograph dated 05/02/2016 FINDINGS: The heart size and mediastinal contours are within normal limits. Both lungs are clear. The visualized skeletal structures are unremarkable. IMPRESSION: No active disease. Electronically Signed   By: Elgie Collard M.D.   On: 05/28/2016 03:10   Ct Head Wo Contrast  Result Date: 05/28/2016 CLINICAL DATA:  66 year old male with altered mental status. EXAM: CT HEAD WITHOUT CONTRAST TECHNIQUE: Contiguous axial images were obtained from the base of the skull through the vertex without intravenous contrast. COMPARISON:  Head CT dated 04/13/2016 FINDINGS: Brain: The ventricles and sulci are appropriate in size for the patient's age. Mild periventricular and deep white matter chronic microvascular ischemic changes noted. There is no acute intracranial hemorrhage. No mass effect or midline shift. No extra-axial fluid collection. Vascular: No hyperdense vessel or unexpected calcification. Skull: Normal. Negative for fracture or focal lesion. Sinuses/Orbits: Minimal mucoperiosteal thickening of paranasal sinuses. No air-fluid level. There is partial opacification of the left mastoid air cells. The right mastoid air cells are clear.  Other: None IMPRESSION: 1. No acute intracranial hemorrhage. 2. Mild age-related atrophy and chronic microvascular ischemic changes. If symptoms persist, and there are no contraindications, MRI may provide better evaluation if clinically indicated. Electronically Signed   By: Elgie Collard M.D.   On: 05/28/2016 03:25   ECG & Cardiac Imaging    EKG: SB  Echo: N/A  Assessment & Plan    66 yo male with PMH of MR, and schizophrenia who presented with lethargy and altered mental status. Found to have bradycardia.  1. Sinus Bradycardia: Noted to have initially been tachycardic on admission, but then HR dropped into the 30/40s. He is basically non-verbal, will answer yes/no to questions when asked. No reported pain. In review of telemetry he was noted to be bradycardic yesterday throughout the evening in the 30/40s. No significant pauses, but does have some episodes of sinus arrhythmias. This morning rates have improved into the 50s consistently. Overall appears asymptomatic.  -- Was on multiple psych medications prior to  admission which have been held. Overall extreme brady episodes seem to have resolved.  -- TSH pending  2. Altered Mental Status: Found to be altered beyond baseline yesterday by his care giver. Seems this could be multifactorial with multiple psych medications, along with recurrent UTI. Appears to be more back to baseline this morning. Awake, and able to answer yes/no to simple questions.   Janice Coffin, NP-C Pager [phone] 05/29/2016, 8:09 AM  I have examined the patient and reviewed assessment and plan and discussed with patient.  Agree with above as stated.  HRs lowest around midnight.  Presumably, he was sleeping.  No syncope described in the chart.  Presentation of lethargy more likely related to infection rather than bradycardia.  No further cardiac testing at this time.  I don't think that he has an indication for pacer at this time.  Would check with  pharmacist regarding psych meds that may be contributing to his bradycardia.  Lance Muss

## 2016-05-29 NOTE — Progress Notes (Signed)
PT Cancellation Note  Patient Details Name: Antonio Montoya MRN: [id number] DOB: [birth year]   Cancelled Treatment:    Reason Eval/Treat Not Completed: Medical issues which prohibited therapy (nursing just cleaned the patient, attempted to stand  pt. but pt. unable to bear weight. )The patient was recently in hospital and unable to participate in PT during admission. Patient's prior level of function since DC is needed in order to properly assess for appropriate functional goals.   [name], Antonio Montoya 05/29/2016, 5:41 PM Blanchard KelchKaren Bladyn Tipps PT [phone]

## 2016-05-29 NOTE — Care Management Note (Signed)
Case Management Note  Patient Details  Name: Mart PiggsCharlie Salls MRN: [id number] Date of Birth: 1950/04/20  Subjective/Objective:  66 y/o m admitted w/Sepsis. From group home. Hand mitts,lethargic,r hip/r shoulder wounds,elbow wound,condom cath. Iv abx. Dysphagia 2 diet. PT-cons-await recc.CSW following for return back to group home.                  Action/Plan:d/c plan group home.   Expected Discharge Date:                Expected Discharge Plan:  Group Home  In-House Referral:  Clinical Social Work  Discharge planning Services  CM Consult  Post Acute Care Choice:    Choice offered to:     DME Arranged:    DME Agency:     HH Arranged:    HH Agency:     Status of Service:  In process, will continue to follow  If discussed at Long Length of Stay Meetings, dates discussed:    Additional Comments:  Lanier ClamMahabir, Burtis Imhoff, RN 05/29/2016, 11:22 AM

## 2016-05-29 NOTE — Progress Notes (Signed)
Antonio Montoya DUK:[id number] DOB: Aug 09, 1950 DOA: 05/28/2016 PCP: Derrell Lolling, MD (Inactive)  Brief narrative: 66 ? Known history of mental retardation, schizophrenia Diet-controlled diabetes mellitus Just recently discharged 05/24/16   returns to the emergency room with nonresponsiveness to verbal cues found to be hypothermic and hypotensive and hypoxic and tachycardic Hemoglobin 10 normal blood gas Given Rocephin  and admitted  Past medical history-As per Problem list Chart reviewed as below-   Consultants:    Procedures:    Antibiotics:     Subjective   Alert tol diet Needing restraints as pulling off cathter Less sleepy than prior per RN    Objective    Interim History:   Telemetry:   Objective: Vitals:   05/28/16 2045 05/28/16 2115 05/28/16 2206 05/29/16 0654  BP:   (!) 129/52 101/60  Pulse: (!) 42 61 (!) 44 (!) 59  Resp: (!) '8 10 11 15  '$ Temp:   (!) 96.4 F (35.8 C) 98.4 F (36.9 C)  TempSrc:   Rectal Oral  SpO2: 100% 100% 100%   Weight:   72 kg (158 lb 11.7 oz)   Height:   6' (1.829 m)     Intake/Output Summary (Last 24 hours) at 05/29/16 3762 Last data filed at 05/29/16 0600  Gross per 24 hour  Intake             2000 ml  Output                0 ml  Net             2000 ml    Exam:  General: eomi ncat-non verbal motiones when wants somthing Cardiovascular:  s1 s 2no m/r/g Respiratory: clear no added soudn Abdomen:  Soft nt nd no rebound Skin no le edema Neuro non verbal moves 4 limbs equally  Data Reviewed: Basic Metabolic Panel:  Recent Labs Lab 05/23/16 0539 05/28/16 0241  NA 141 142  K 3.9 3.7  CL 104 107  CO2 29 30  GLUCOSE 91 99  BUN 18 25*  CREATININE 1.14 1.07  CALCIUM 9.9 8.9  MG  --  1.9   Liver Function Tests:  Recent Labs Lab 05/28/16 0241  AST 153*  ALT 64*  ALKPHOS 81  BILITOT 0.4  PROT 5.9*  ALBUMIN 2.8*   No results for input(s): LIPASE, AMYLASE in the last 168 hours.  Recent Labs Lab  05/28/16 0241  AMMONIA 27   CBC:  Recent Labs Lab 05/28/16 0241  WBC 5.9  NEUTROABS 4.8  HGB 10.3*  HCT 29.3*  MCV 93.6  PLT 243   Cardiac Enzymes:  Recent Labs Lab 05/28/16 0241  TROPONINI <0.03   BNP: Invalid input(s): POCBNP CBG: No results for input(s): GLUCAP in the last 168 hours.  No results found for this or any previous visit (from the past 240 hour(s)).   Studies:              All Imaging reviewed and is as per above notation   Scheduled Meds: . benztropine  0.5 mg Oral Daily  . cefTRIAXone (ROCEPHIN)  IV  1 g Intravenous Q24H  . enoxaparin (LOVENOX) injection  40 mg Subcutaneous Q24H  . haloperidol  5 mg Oral BID  . lamoTRIgine  100 mg Oral BID  . pantoprazole  40 mg Oral Daily  . sodium chloride flush  3 mL Intravenous Q12H   Continuous Infusions: . sodium chloride 125 mL/hr at 05/28/16 2245     Assessment/Plan:  1.  Altered mental stats-likely 2/2 to multiple psychotropic meds + UTI-would cut back PTA haldol from 5-->2.5, but continue the rest of meds--there is theoretical risk of increased anticholinergic effects with some of thes emeds-note from PTA meds saphris 5 and tegretol have been held.  Use restraints as needed 2. ?UTI-unclear if infected urine-monitor on Ceftriaxone-follow urine cult sensitivieities and narrow probably to penicillin meds in next 24 hrs 3. SHcizophrenia-in groupd home-cont meds as above 4. mildy elevated alk phos-unclear etiology.  Recheck prior to d/c    No family D/c to facility when able  Verneita Griffes, MD  [facility] Pager [phone] 05/29/2016, 8:29 AM    LOS: 1 day

## 2016-05-30 DIAGNOSIS — F79 Unspecified intellectual disabilities: Secondary | ICD-10-CM

## 2016-05-30 DIAGNOSIS — R1310 Dysphagia, unspecified: Secondary | ICD-10-CM

## 2016-05-30 DIAGNOSIS — N390 Urinary tract infection, site not specified: Secondary | ICD-10-CM | POA: Diagnosis present

## 2016-05-30 LAB — URINE CULTURE

## 2016-05-30 MED ORDER — RESOURCE THICKENUP CLEAR PO POWD: ORAL | 3 refills | Status: DC

## 2016-05-30 MED ORDER — SULFAMETHOXAZOLE-TRIMETHOPRIM 800-160 MG PO TABS
1.0000 | ORAL_TABLET | Freq: Two times a day (BID) | ORAL | 0 refills | Status: DC
Start: 2016-05-30 — End: 2016-06-04

## 2016-05-30 MED ORDER — BENZTROPINE MESYLATE 0.5 MG PO TABS: 0.5000 mg | ORAL_TABLET | Freq: Every evening | ORAL | 0 refills | Status: AC

## 2016-05-30 MED ORDER — LORAZEPAM 1 MG PO TABS: 0.5000 mg | ORAL_TABLET | Freq: Four times a day (QID) | ORAL | 0 refills | Status: DC | PRN

## 2016-05-30 NOTE — Progress Notes (Signed)
Pt laying in bed watching TV. Pt being cooperative. Pt denies any needs at this time. Will continue to monitor.

## 2016-05-30 NOTE — Progress Notes (Signed)
CSW contaced patient's legal guardian Antonio Montoya(Antonio Montoya [phone]) and confirmed plan for patient to return to group home at discharge. CSW informed patient's legal guardian that patient was being discharged today. Patient's legal guardian advised CSW to contact group home to set up transportation for dischagre.   CSW spoke with listed caregiver for patient Antonio Montoya(Antonio Montoya [phone]). Caregiver reported that he no longer works with group home agency and advised CSW to contact patient's care coordinator Antonio Montoya to inquire about patient's residency.   CSW contacted patient's care coordinator Antonio Montoya [phone]), no answer. CSW left voicemail requesting return phone call. CSW contacted patient's care coordinator supervisor Antonio Montoya [phone], no answer. CSW contacted [phone] to speak with IDD care coordinator Antonio Montoya region, CSW transferred to patient care access staff, staff agreed to e-mail patient's care coordinator [place] PathAmanda.  CS[facility] by [facility] staff member, Antonio Montoya [phone]). Staff reports that patient's current caregiver "abruptly resigned" and she is searching for a new placement for patient. Staff inquired about PT recommendations for patient, CSW informed staff about current PT recommendations. [facility]. Staff requested that CSW follow up with PT to confirm PT recommendations, CSW agreed.   CSW spoke with PT Antonio Montoya(Antonio Montoya), PT confirmed current recommendation that patient is not appropriate for skilled PT.  CSW updated [facility] staff, staff reported that she is still looking for placement for patient without steps due to patient's inability to walk. Staff reported that she would e-mail patient's care coordinator and inform her to contact CSW in regards to patient needing placement.   CSW will continue to follow patient for discharge planning.   Antonio Montoya, [place] Clinical Social Worker [facility]  Hospital Cell#: [phone]
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
## 2016-05-30 NOTE — Progress Notes (Signed)
Initial Nutrition Assessment  DOCUMENTATION CODES:   Not applicable  INTERVENTION:  - Continue current diet order per SLP. - Continue Magic Cup TID with meals, each supplement provides 290 kcal and 9 grams of protein; per current diet order protocol.  - RD will continue to monitor for additional needs.  NUTRITION DIAGNOSIS:   Swallowing difficulty related to dysphagia as evidenced by other (see comment) (Need for Dysphagia 2, nectar-thick diet).  GOAL:   Patient will meet greater than or equal to 90% of their needs  MONITOR:   PO intake, Supplement acceptance, Weight trends, Labs  REASON FOR ASSESSMENT:   Rounds, Other (Comment) (wound)  ASSESSMENT:   66 y.o. male with medical history significant of mental retardation and schizophrenia, was brought to ED for lethargy, altered mental status. He is not responding to verbal cues or on sternal rub he would transiently groan. No care giver or family at bedside, called both the caregiver and his sister phone number and left messages to call back. Most of the history was from EDP notes. On arrival to ED he was found to be hypothermic, hypotensive, and hypoxic, initially tachycardic and then became bradycardic. His BP responded to fluid boluses and his bp parameters are 90/ 50's with MAP greater than 65.   BMI indicates normal weight status. Per chart review, pt consumed 100% of lunch and dinner yesterday. Spoke with Tech who fed pt this AM. She reports good appetite this AM and ate most of his food; he did take a little longer to swallow but otherwise no difficulties with meals. She is familiar with pt from previous admission (last month) and states he always eats well for her. No family/visitors present at this time and pt is noted to be mainly nonverbal.   Physical assessment shows no muscle and no fat wasting, mild edema to extremities. Per chart review, weight +2 lbs over the past 1 month and no other weight recordings available at this  time.  Medications reviewed; 40 mg oral Protonix/day.  Labs reviewed; BUN: 25 mg/dL, AST/ALT elevated.  IVF: NS @ 125 mL/hr.    Diet Order:  DIET DYS 2 Room service appropriate? Yes; Fluid consistency: Nectar Thick  Skin:  Wound (see comment) (Stage 2 R hip pressure injury)  Last BM:  PTA/unknown  Height:   Ht Readings from Last 1 Encounters:  05/28/16 6' (1.829 m)    Weight:   Wt Readings from Last 1 Encounters:  05/28/16 158 lb 11.7 oz (72 kg)    Ideal Body Weight:  83.64 kg  BMI:  Body mass index is 21.53 kg/m.  Estimated Nutritional Needs:   Kcal:  1585-1800 (22-25 kcal/kg)  Protein:  65-75 grams  Fluid:  1.6-1.8 L/day  EDUCATION NEEDS:   No education needs identified at this time    Trenton GammonJessica Zyniah Ferraiolo, MS, RD, LDN, CNSC Inpatient Clinical Dietitian Pager # [phone] After hours/weekend pager # [phone]

## 2016-05-30 NOTE — Progress Notes (Signed)
    Tele reviewed.  Episodic sinus bradycardia.  No significant heart block noted.  Will sign off, please call if he develops heart block or symptoms related to low HR such as syncope.  Corky CraftsJayadeep S Jacorion Klem, MD

## 2016-05-30 NOTE — Progress Notes (Signed)
PT Cancellation Note  Patient Details Name: Antonio Montoya MRN: [id number] DOB: 1950/07/14   Cancelled Treatment:    Reason Eval/Treat Not Completed: PT screened, no needs identified, will sign off  Pt lethargic, in restraints and mitts at this time.  RN reports likely d/c back to group home today.  Pt with recent admission and not appropriate for skilled PT needs based on decreased ability to follow commands, and pt easily agitated.  PT to sign off.    Samar Venneman,KATHrine E 05/30/2016, 12:11 PM Zenovia JarredKati Diany Formosa, PT, DPT 05/30/2016 Pager: [phone]

## 2016-05-30 NOTE — Progress Notes (Signed)
Pt has had no urine output this shift. Bladder scan yielded 0. MD text paged with findings. Will continue to monitor.

## 2016-05-31 DIAGNOSIS — F79 Unspecified intellectual disabilities: Secondary | ICD-10-CM

## 2016-05-31 DIAGNOSIS — R7881 Bacteremia: Secondary | ICD-10-CM

## 2016-05-31 DIAGNOSIS — A4151 Sepsis due to Escherichia coli [E. coli]: Secondary | ICD-10-CM

## 2016-05-31 DIAGNOSIS — A412 Sepsis due to unspecified staphylococcus: Secondary | ICD-10-CM

## 2016-05-31 DIAGNOSIS — R1310 Dysphagia, unspecified: Secondary | ICD-10-CM

## 2016-05-31 DIAGNOSIS — N390 Urinary tract infection, site not specified: Secondary | ICD-10-CM

## 2016-05-31 LAB — BASIC METABOLIC PANEL
ANION GAP: 4 — AB (ref 5–15)
BUN: 25 mg/dL — ABNORMAL HIGH (ref 6–20)
CALCIUM: 8.9 mg/dL (ref 8.9–10.3)
CHLORIDE: 114 mmol/L — AB (ref 101–111)
CO2: 27 mmol/L (ref 22–32)
Creatinine, Ser: 1.16 mg/dL (ref 0.61–1.24)
GFR calc non Af Amer: >60 mL/min (ref >60)
GLUCOSE: 80 mg/dL (ref 65–99)
Potassium: 3.9 mmol/L (ref 3.5–5.1)
Sodium: 145 mmol/L (ref 135–145)

## 2016-05-31 LAB — MAGNESIUM: Magnesium: 1.8 mg/dL (ref 1.7–2.4)

## 2016-05-31 NOTE — Progress Notes (Signed)
MD coverage paged to inform about 3.28 second pause. Will continue to monitor.

## 2016-05-31 NOTE — Clinical Social Work Note (Signed)
CSW followed up with Beckie SaltsKim Hilliard at [facility]. Selena BattenKim states that the [facility] will not be able to accept the patient back due to his increased care needs and lack of staff to provide care. CSW has updated CSW AD on patient. CSW will speak with sister to determine if the patient can stay with the sister. If the patient's sister is unable to take the patient home, CSW will need to look into SNF placement for the patient. Please note that given the patient's mental health history and intellectual disability, a SNF PASRR will likely take several days to be approved.   Roddie McBryant Oviya Ammar MSW, [place]CS[place], [place], [phone]

## 2016-05-31 NOTE — Progress Notes (Addendum)
Patient with multiple pauses ranging from 1.95 to 2.34 seconds with HR in the 40's. Patient is asymptomatic and in no acute distress. VSS. According to MD note MD is aware of the pauses from the previous night. Will continue to monitor patient closely.

## 2016-05-31 NOTE — Progress Notes (Signed)
PROGRESS NOTE  Antonio Montoya ZOX:[id number] DOB: 12-Nov-1950 DOA: 05/28/2016 PCP: Derrell Lolling, MD (Inactive)  HPI/Recap of past 60 hours: 66 year old male with past medical history of diabetes mellitus diet controlled as well as schizophrenia and mental retardation initially discharged from the hospitalist service on 3/15 brought back to the emergency room on 3/19 with acute encephalopathy and patient found to be hypothermic/hypotensive and tachycardic. He was found to have a very large urinary tract infection and admitted for sepsis secondary to UTI. Patient started on IV Rocephin. Initial plan was for patient be discharged back to group home, however they are not willing to accept him. Therefore he will need to go to a skilled nursing facility, given his mentation issues will require a PASSAR first.  Overnight, patient had a few pauses on telemetry. Speech therapy follow-up the patient on 3/22 and further downgraded his dysphagia diet to dysphagia 1.  Patient himself does not verbalize any complaints.  Assessment/Plan:  Sepsis ([place]) secondary to urinary tract infection causing acute encephalopathy: Patient met criteria for sepsis on admission given hypotension requiring multiple fluid boluses, urinary tract infection source, tachycardia and secondary altered mentation. Following aggressive fluid resuscitation and IV antibiotics, patient improved. By 3/21 He was back at his baseline which is limited given his mental retardation and schizophrenia. He does require sitter at his group home. Urine Cultures grow positive for staph epidermidis sensitive to Bactrim which patient was started on.  Given prolonged stay, change back to IV Rocephin  Schizophrenia: Stable, continued on Cogentin and Lamictal  Moderate mental retardation: Patient will return back to group home  Bradycardia: Cardiology was consulted when patient was noted to have questionable heart block. However increase her examination, this  was felt to be bradycardia. No further intervention was needed. Had reported pauses overnight of 3/21. Follow-up labs note stable electrolytes  Dysphagia: Given patient's altered mentation, seen by speech therapy. They felt that he likely had chronic dysphagia but no acute aspiration event. He was downgraded to a dysphagia 2 diet with nectar thick liquids. Speech therapy followed up on 3/22 and found that he was having difficulty with this diet. Downgraded further to dysphagia 1  Code Status: Full code   Family Communication: Tried to contact sister   Disposition Plan: Initial plan was for patient be discharged back to his group home on 3/21. However they declined to take him back and now he will need to go to skilled nursing. Given his mentation issues, will require passar before being accepted   Procedures:  None  Consultations:  Cardiology   Antimicrobials:  IV Rocephin 3/20-present   DVT prophylaxis: Lovenox   Objective: Vitals:   05/30/16 1531 05/30/16 2142 05/31/16 0535 05/31/16 1255  BP: (!) 119/56 (!) 130/51 (!) 121/55 129/75  Pulse: (!) 43 (!) 43 (!) 46 (!) 42  Resp: '19 16 16 16  '$ Temp: 97.8 F (36.6 C) 98.2 F (36.8 C) 98.1 F (36.7 C) 98 F (36.7 C)  TempSrc: Oral Oral Oral Axillary  SpO2: 100% 100% 100% 99%  Weight:      Height:        Intake/Output Summary (Last 24 hours) at 05/31/16 1443 Last data filed at 05/31/16 0400  Gross per 24 hour  Intake             2870 ml  Output              550 ml  Net             2320  ml   Filed Weights   05/28/16 2206  Weight: 72 kg (158 lb 11.7 oz)    Exam:   General:  Somnolent at times   Cardiovascular: Regular rate and rhythm, S1-S2   Respiratory: Clear to auscultation bilaterally   Abdomen: Soft, nondistended, hypoactive bowel sounds   Musculoskeletal: No clubbing or cyanosis, trace pitting edema   Psychiatry: Chronic mental retardation, no agitation    Data Reviewed: CBC:  Recent Labs Lab  05/28/16 0241  WBC 5.9  NEUTROABS 4.8  HGB 10.3*  HCT 29.3*  MCV 93.6  PLT 119   Basic Metabolic Panel:  Recent Labs Lab 05/28/16 0241 05/31/16 0838  NA 142 145  K 3.7 3.9  CL 107 114*  CO2 30 27  GLUCOSE 99 80  BUN 25* 25*  CREATININE 1.07 1.16  CALCIUM 8.9 8.9  MG 1.9 1.8   GFR: Estimated Creatinine Clearance: 65.5 mL/min (by C-G formula based on SCr of 1.16 mg/dL). Liver Function Tests:  Recent Labs Lab 05/28/16 0241  AST 153*  ALT 64*  ALKPHOS 81  BILITOT 0.4  PROT 5.9*  ALBUMIN 2.8*   No results for input(s): LIPASE, AMYLASE in the last 168 hours.  Recent Labs Lab 05/28/16 0241  AMMONIA 27   Coagulation Profile:  Recent Labs Lab 05/28/16 0241  INR 1.15   Cardiac Enzymes:  Recent Labs Lab 05/28/16 0241  TROPONINI <0.03   BNP (last 3 results) No results for input(s): PROBNP in the last 8760 hours. HbA1C: No results for input(s): HGBA1C in the last 72 hours. CBG: No results for input(s): GLUCAP in the last 168 hours. Lipid Profile: No results for input(s): CHOL, HDL, LDLCALC, TRIG, CHOLHDL, LDLDIRECT in the last 72 hours. Thyroid Function Tests:  Recent Labs  05/29/16 0821  TSH 0.892   Anemia Panel: No results for input(s): VITAMINB12, FOLATE, FERRITIN, TIBC, IRON, RETICCTPCT in the last 72 hours. Urine analysis:    Component Value Date/Time   COLORURINE YELLOW 05/28/2016 0406   APPEARANCEUR CLEAR 05/28/2016 0406   LABSPEC 1.015 05/28/2016 0406   PHURINE 6.0 05/28/2016 0406   GLUCOSEU NEGATIVE 05/28/2016 0406   HGBUR LARGE (A) 05/28/2016 0406   BILIRUBINUR NEGATIVE 05/28/2016 0406   KETONESUR NEGATIVE 05/28/2016 0406   PROTEINUR 100 (A) 05/28/2016 0406   UROBILINOGEN 1.0 01/16/2012 1957   NITRITE NEGATIVE 05/28/2016 0406   LEUKOCYTESUR NEGATIVE 05/28/2016 0406   Sepsis Labs: '@LABRCNTIP'$ (procalcitonin:4,lacticidven:4)  ) Recent Results (from the past 240 hour(s))  Blood culture (routine x 2)     Status: None  (Preliminary result)   Collection Time: 05/28/16  2:42 AM  Result Value Ref Range Status   Specimen Description BLOOD LEFT ANTECUBITAL  Final   Special Requests BOTTLES DRAWN AEROBIC AND ANAEROBIC 5CC  Final   Culture   Final    NO GROWTH 3 DAYS Performed at [facility], [place] [address] [place], [place] [address]    Report Status PENDING  Incomplete  Blood culture (routine x 2)     Status: None (Preliminary result)   Collection Time: 05/28/16  2:53 AM  Result Value Ref Range Status   Specimen Description BLOOD RIGHT ANTECUBITAL  Final   Special Requests IN PEDIATRIC BOTTLE 2CC  Final   Culture   Final    NO GROWTH 3 DAYS Performed at [facility], [place] [address] [place], [place] [address]    Report Status PENDING  Incomplete  Urine culture     Status: Abnormal   Collection  Time: 05/28/16  4:06 AM  Result Value Ref Range Status   Specimen Description URINE, CLEAN CATCH  Final   Special Requests NONE  Final   Culture >=100,000 COLONIES/mL STAPHYLOCOCCUS EPIDERMIDIS (A)  Final   Report Status 05/30/2016 FINAL  Final   Organism ID, Bacteria STAPHYLOCOCCUS EPIDERMIDIS (A)  Final      Susceptibility   Staphylococcus epidermidis - MIC*    CIPROFLOXACIN 4 RESISTANT Resistant     GENTAMICIN <=0.5 SENSITIVE Sensitive     NITROFURANTOIN <=16 SENSITIVE Sensitive     OXACILLIN >=4 RESISTANT Resistant     TETRACYCLINE 2 SENSITIVE Sensitive     VANCOMYCIN 2 SENSITIVE Sensitive     TRIMETH/SULFA <=10 SENSITIVE Sensitive     CLINDAMYCIN <=0.25 SENSITIVE Sensitive     RIFAMPIN <=0.5 SENSITIVE Sensitive     Inducible Clindamycin NEGATIVE Sensitive     * >=100,000 COLONIES/mL STAPHYLOCOCCUS EPIDERMIDIS      Studies: No results found.  Scheduled Meds: . benztropine  0.5 mg Oral Daily  . cefTRIAXone (ROCEPHIN)  IV  1 g Intravenous Q24H  . enoxaparin (LOVENOX) injection  40 mg Subcutaneous Q24H  . haloperidol  5 mg Oral BID  . lamoTRIgine  100 mg Oral BID  . pantoprazole   40 mg Oral Daily  . sodium chloride flush  3 mL Intravenous Q12H    Continuous Infusions: . sodium chloride 125 mL/hr at 05/30/16 2133     LOS: 3 days    Annita Brod, MD [facility] Pager [phone]  If 7PM-7AM, please contact night-coverage [email or web address] Password [facility] 05/31/2016, 2:43 PM

## 2016-05-31 NOTE — Progress Notes (Signed)
Pt taken out of restraints on 05/30/16 at 1200. Pt cooperative and following directions. Pt still out of restrains 05/31/16 at 0700 when write and night RN did bedside reporting.

## 2016-05-31 NOTE — Progress Notes (Signed)
Informed MD of 5 second pause with sustained bradycardia in the low 30s.

## 2016-05-31 NOTE — Care Management Note (Signed)
Case Management Note  Patient Details  Name: Antonio Montoya MRN: [id number] Date of Birth: [birth year]  Subjective/Objective:PT-too agitated, no identified needs. Medically stable for d/c. Noted CSW following for possible SNF placement w/Passar if family unable to take home.                  Action/Plan:d/c SNF.   Expected Discharge Date:              Expected Discharge Plan:  Skilled Nursing Facility  In-House Referral:  Clinical Social Work  Discharge planning Services  CM Consult  Post Acute Care Choice:    Choice offered to:     DME Arranged:    DME Agency:     HH Arranged:    HH Agency:     Status of Service:  Completed, signed off  If discussed at [employer] of [employer] Meetings, dates discussed:    Additional Comments:  Lanier ClamMahabir, Tarik Teixeira, RN 05/31/2016, 11:14 AM

## 2016-05-31 NOTE — Progress Notes (Signed)
SlP modified diet due to excessive oral holding today and pt requiring 30 minutes of feeding to consume mashed potaotes, pudding and thickened milk.  [facility]. Notes to follow and RN informed.  [name] Evanny Ellerbe, MS [facility] SLP [phone]

## 2016-05-31 NOTE — Progress Notes (Signed)
  Speech Language Pathology Treatment: Dysphagia  Patient Details Name: Antonio Montoya MRN: [id number] DOB: 1950-10-19 Today's Date: 05/31/2016 Time: 1135-1207 SLP Time Calculation (min) (ACUTE ONLY): 32 min  Assessment / Plan / Recommendation Clinical Impression  Pt today with worsening delay in oral transiting compared to yesterday.  SLP provided pt with mashed potaotes, pudding, chopped meats, nectar milk and tomato juice.   Oral holding with pt ceasing manipulation noted despite total verbal/visual/tactile cues to pt to swallow.  Use of nectar thick via tsp or straw was effective to help elicit swallow - likely due to premature spillage of boluses into pharynx triggering reflexive swallow.  Dry spoon pressure to tongue not effective with 5 attempts.  Cough x 2/20 boluses noted during intake- likely mild aspiration occurred.  Pt could not "masticate" food well and diet downgraded for maximal efficiency and safety.  It took this SLP 30 minutes to feed pt mashed potatoes, pudding and approximately 1/2 milk. Caregivers will need to follow strict precautions to mitigate dysphagia/aspiration.  Recommend maximize liquid nutrition - eg: Ensure/Glucerna due to level of dysphagia.  Please order follow up SLP at next venue of care to assure pt's diet is advanced as indicated/tolerated.  RN informed of change in plan and SLP requested oral suction to be set up and used.      HPI HPI: 66 [facility].  Pt PMH + for schizophrenia, MR.  Pt imaging studies were negative - CT head, CXR.  Swallow evaluation ordered.        SLP Plan  Continue with current plan of care       Recommendations  Diet recommendations: Dysphagia 1 (puree);Nectar-thick liquid Liquids provided via: Cup;Straw;Teaspoon Medication Administration: Whole meds with puree Compensations: Minimize environmental distractions;Slow rate;Small sips/bites (use liquids to faciliate swallow if pt oral holding, assure pt  swallows, clean mouth after meals, oral suction prn) Postural Changes and/or Swallow Maneuvers: Seated upright 90 degrees;Upright 30-60 min after meal                Oral Care Recommendations: Oral care before and after PO Follow up Recommendations: Skilled Nursing facility SLP Visit Diagnosis: Dysphagia, oropharyngeal phase (R13.12) Plan: Continue with current plan of care       GO                Antonio Montoya Antonio Gaertner, MS [facility] SLP [phone]

## 2016-06-01 MED ORDER — SULFAMETHOXAZOLE-TRIMETHOPRIM 800-160 MG PO TABS
1.0000 | ORAL_TABLET | Freq: Two times a day (BID) | ORAL | Status: DC
  Administered 2016-06-01 – 2016-06-04 (×7): 1 via ORAL
  Filled 2016-06-01 (×8): qty 1

## 2016-06-01 MED ORDER — ATROPINE SULFATE 1 MG/10ML IJ SOSY
0.5000 mg | PREFILLED_SYRINGE | INTRAMUSCULAR | Status: DC | PRN
  Filled 2016-06-01 (×2): qty 10

## 2016-06-01 MED ORDER — ATROPINE SULFATE 1 MG/10ML IJ SOSY
PREFILLED_SYRINGE | INTRAMUSCULAR | Status: AC
  Administered 2016-06-01: 1 mg
  Filled 2016-06-01: qty 10

## 2016-06-01 NOTE — NC FL2 (Signed)
[place] MEDICAID FL2 LEVEL OF CARE SCREENING TOOL     IDENTIFICATION  Patient Name: Antonio PiggsCharlie Boozer Birthdate: Jul 08, 1950 Sex: male Admission Date (Current Location): 05/28/2016  [facility] [place]:   Ignacia Palma([place] )   [facility] and Address[facility]  [facility],  [address], [place] [id number]      Provider Number: [id number]  Attending Physician Name and Address:  Hollice EspySendil K Krishnan, MD  Relative Name and Phone Number:       Current Level of Care: Hospital Recommended Level of Care: Skilled Nursing Facility Prior Approval Number:    Date Approved/Denied:   PASRR Number:    Discharge Plan: SNF    Current Diagnoses: Patient Active Problem List   Diagnosis Date Noted  . Urinary tract infection without hematuria   . MR (mental retardation) 05/30/2016  . Dysphagia 05/30/2016  . Altered mental status 05/28/2016  . Urinary retention 05/04/2016  . Thrombocytopenia (HCC) 05/04/2016  . Pressure injury of skin 05/02/2016  . Hypothermia 05/02/2016  . Elevated LFTs   . Acute delirium 05/01/2016    Orientation RESPIRATION BLADDER Height & Weight     Self  Normal External catheter, Incontinent Weight: 158 lb 11.7 oz (72 kg) Height:  6' (182.9 cm)  BEHAVIORAL SYMPTOMS/MOOD NEUROLOGICAL BOWEL NUTRITION STATUS   (Mental Retardation)   Incontinent Diet (Dysphagia 1 (puree);Nectar-thick liquid)  AMBULATORY STATUS COMMUNICATION OF NEEDS Skin   Extensive Assist Verbally PU Stage and Appropriate Care (Partial thickness of dermis presents as a shallow ulcer w/ red pink wound without slough. Irregular Size. White grancalation tissue to area. Draining Puss. )                       Personal Care Assistance Level of Assistance  Bathing, Dressing, Feeding Bathing Assistance: Maximum assistance Feeding assistance: Limited assistance Dressing Assistance: Maximum assistance     Functional Limitations Info      Hearing Info: Adequate Speech Info: Impaired     SPECIAL CARE FACTORS FREQUENCY   PT/OT  5x week  5x week                   Contractures Contractures Info: Present    Additional Factors Info    Code Status Info: Fullcode  Allergies Info: NKA Psychotropic Info: Haldol, LamictaL, Ativan          Current Medications (06/01/2016):  This is the current hospital active medication list Current Facility-Administered Medications  Medication Dose Route Frequency Provider Last Rate Last Dose  . 0.9 %  sodium chloride infusion   Intravenous Continuous Linwood DibblesJon Knapp, MD 125 mL/hr at 06/01/16 0526    . acetaminophen (TYLENOL) tablet 650 mg  650 mg Oral Q6H PRN Kathlen ModyVijaya Akula, MD      . asenapine (SAPHRIS) sublingual tablet 5 mg  5 mg Sublingual Q12H PRN Kathlen ModyVijaya Akula, MD      . atropine 1 MG/10ML injection 0.5 mg  0.5 mg Intravenous PRN Hollice EspySendil K Krishnan, MD      . benztropine (COGENTIN) tablet 0.5 mg  0.5 mg Oral Daily Kathlen ModyVijaya Akula, MD   0.5 mg at 06/01/16 0936  . enoxaparin (LOVENOX) injection 40 mg  40 mg Subcutaneous Q24H Kathlen ModyVijaya Akula, MD   40 mg at 05/31/16 2337  . haloperidol (HALDOL) tablet 5 mg  5 mg Oral BID Kathlen ModyVijaya Akula, MD   5 mg at 06/01/16 0936  . lamoTRIgine (LAMICTAL) tablet 100 mg  100 mg Oral BID Kathlen ModyVijaya Akula, MD   100  mg at 06/01/16 0936  . pantoprazole (PROTONIX) EC tablet 40 mg  40 mg Oral Daily Kathlen Mody, MD   40 mg at 06/01/16 0936  . RESOURCE THICKENUP CLEAR   Oral PRN Rhetta Mura, MD      . sodium chloride flush (NS) 0.9 % injection 3 mL  3 mL Intravenous Q12H Kathlen Mody, MD   3 mL at 05/30/16 2202  . sulfamethoxazole-trimethoprim (BACTRIM DS,SEPTRA DS) 800-160 MG per tablet 1 tablet  1 tablet Oral Q12H Hollice Espy, MD   1 tablet at 06/01/16 0957     Discharge Medications: Please see discharge summary for a list of discharge medications.  Relevant Imaging Results:  Relevant Lab Results:   Additional Information ssn:[id number]  Clearance Coots, LCSW

## 2016-06-01 NOTE — Progress Notes (Addendum)
LCSWA [facility] she plans to meet with patient today at bedside for review, QMHP Evaluation.PASRR still pending.  Will continue to keep medical staff updated.  CSW placed call to patient sister with update, left voicemail. Weekend CSW will update sister with bed offers.   Vivi BarrackNicole Tuwanda Vokes, Theresia MajorsLCSWA, MSW Clinical Social Worker 5E and Psychiatric Service Line [phone] 06/01/2016  4:23 PM

## 2016-06-01 NOTE — Progress Notes (Signed)
PROGRESS NOTE  Antonio Montoya HGD:[id number] DOB: 11/02/1950 DOA: 05/28/2016 PCP: Derrell Lolling, MD (Inactive)  HPI/Recap of past 4 hours: 66 year old male with past medical history of diabetes mellitus diet controlled as well as schizophrenia and mental retardation initially discharged from the hospitalist service on 3/15 brought back to the emergency room on 3/19 with acute encephalopathy and patient found to be hypothermic/hypotensive and tachycardic. He was found to have a very large urinary tract infection and admitted for sepsis secondary to UTI. Patient started on IV Rocephin. Initial plan was for patient be discharged back to group home, however they are not willing to accept him. Therefore he will need to go to a skilled nursing facility, given his mentation issues will require a PASSAR first.  On night of 3/21 and again last night, patient had a few pauses on telemetry. Patient's heart rate has been staying in the 40s, occasionally down into the 30s. Speech therapy follow-up the patient on 3/22 and further downgraded his dysphagia diet to dysphagia 1.  Patient himself does not verbalize any complaints.  Assessment/Plan:  Sepsis ([place]) secondary to urinary tract infection causing acute encephalopathy: Patient met criteria for sepsis on admission given hypotension requiring multiple fluid boluses, urinary tract infection source, tachycardia and secondary altered mentation. Following aggressive fluid resuscitation and IV antibiotics, patient improved. By 3/21 He was back at his baseline which is limited given his mental retardation and schizophrenia. He does require sitter at his group home. Urine Cultures grow positive for staph epidermidis sensitive to Bactrim which patient was started on.  Schizophrenia: Stable, continued on Cogentin and Lamictal  Moderate mental retardation: Patient will return back to group home  Bradycardia: Cardiology was consulted when patient was noted to have  questionable heart block. However increase her examination, this was felt to be bradycardia. No further intervention was needed. Had reported pauses for the last 2 nights Follow-up labs note stable electrolytes. Will keep atropine at the bedside. We'll discuss with cardiology  Dysphagia: Given patient's altered mentation, seen by speech therapy. They felt that he likely had chronic dysphagia but no acute aspiration event. He was downgraded to a dysphagia 2 diet with nectar thick liquids. Speech therapy followed up on 3/22 and found that he was having difficulty with this diet. Downgraded further to dysphagia 1  Code Status: Full code   Family Communication: Tried to contact sister   Disposition Plan: Initial plan was for patient be discharged back to his group home on 3/21. However they declined to take him back and now he will need to go to skilled nursing. Given his mentation issues, will require passar before being accepted   Procedures:  None  Consultations:  Cardiology   Antimicrobials:  IV Rocephin 3/20-present   DVT prophylaxis: Lovenox   Objective: Vitals:   05/31/16 0535 05/31/16 1255 05/31/16 2107 06/01/16 0525  BP: (!) 121/55 129/75 (!) 141/64 (!) 169/68  Pulse: (!) 46 (!) 42 (!) 41 (!) 41  Resp: '16 16 16 20  '$ Temp: 98.1 F (36.7 C) 98 F (36.7 C) 98.6 F (37 C) 97.7 F (36.5 C)  TempSrc: Oral Axillary Oral Oral  SpO2: 100% 99% 100% 97%  Weight:      Height:        Intake/Output Summary (Last 24 hours) at 06/01/16 1127 Last data filed at 06/01/16 0930  Gross per 24 hour  Intake          3854.58 ml  Output  450 ml  Net          3404.58 ml   Filed Weights   05/28/16 2206  Weight: 72 kg (158 lb 11.7 oz)    Exam:   General:  Awake, not really interactive   Cardiovascular: Regular rhythm, bradycardic  Respiratory: Clear to auscultation bilaterally   Abdomen: Soft, nondistended, hypoactive bowel sounds   Musculoskeletal: No clubbing  or cyanosis, trace pitting edema   Psychiatry: Chronic mental retardation, no agitation    Data Reviewed: CBC:  Recent Labs Lab 05/28/16 0241  WBC 5.9  NEUTROABS 4.8  HGB 10.3*  HCT 29.3*  MCV 93.6  PLT 762   Basic Metabolic Panel:  Recent Labs Lab 05/28/16 0241 05/31/16 0838  NA 142 145  K 3.7 3.9  CL 107 114*  CO2 30 27  GLUCOSE 99 80  BUN 25* 25*  CREATININE 1.07 1.16  CALCIUM 8.9 8.9  MG 1.9 1.8   GFR: Estimated Creatinine Clearance: 65.5 mL/min (by C-G formula based on SCr of 1.16 mg/dL). Liver Function Tests:  Recent Labs Lab 05/28/16 0241  AST 153*  ALT 64*  ALKPHOS 81  BILITOT 0.4  PROT 5.9*  ALBUMIN 2.8*   No results for input(s): LIPASE, AMYLASE in the last 168 hours.  Recent Labs Lab 05/28/16 0241  AMMONIA 27   Coagulation Profile:  Recent Labs Lab 05/28/16 0241  INR 1.15   Cardiac Enzymes:  Recent Labs Lab 05/28/16 0241  TROPONINI <0.03   BNP (last 3 results) No results for input(s): PROBNP in the last 8760 hours. HbA1C: No results for input(s): HGBA1C in the last 72 hours. CBG: No results for input(s): GLUCAP in the last 168 hours. Lipid Profile: No results for input(s): CHOL, HDL, LDLCALC, TRIG, CHOLHDL, LDLDIRECT in the last 72 hours. Thyroid Function Tests: No results for input(s): TSH, T4TOTAL, FREET4, T3FREE, THYROIDAB in the last 72 hours. Anemia Panel: No results for input(s): VITAMINB12, FOLATE, FERRITIN, TIBC, IRON, RETICCTPCT in the last 72 hours. Urine analysis:    Component Value Date/Time   COLORURINE YELLOW 05/28/2016 0406   APPEARANCEUR CLEAR 05/28/2016 0406   LABSPEC 1.015 05/28/2016 0406   PHURINE 6.0 05/28/2016 0406   GLUCOSEU NEGATIVE 05/28/2016 0406   HGBUR LARGE (A) 05/28/2016 0406   BILIRUBINUR NEGATIVE 05/28/2016 0406   KETONESUR NEGATIVE 05/28/2016 0406   PROTEINUR 100 (A) 05/28/2016 0406   UROBILINOGEN 1.0 01/16/2012 1957   NITRITE NEGATIVE 05/28/2016 0406   LEUKOCYTESUR NEGATIVE  05/28/2016 0406   Sepsis Labs: '@LABRCNTIP'$ (procalcitonin:4,lacticidven:4)  ) Recent Results (from the past 240 hour(s))  Blood culture (routine x 2)     Status: None (Preliminary result)   Collection Time: 05/28/16  2:42 AM  Result Value Ref Range Status   Specimen Description BLOOD LEFT ANTECUBITAL  Final   Special Requests BOTTLES DRAWN AEROBIC AND ANAEROBIC 5CC  Final   Culture   Final    NO GROWTH 3 DAYS Performed at [facility], [place] [address] [place], [place] [address]    Report Status PENDING  Incomplete  Blood culture (routine x 2)     Status: None (Preliminary result)   Collection Time: 05/28/16  2:53 AM  Result Value Ref Range Status   Specimen Description BLOOD RIGHT ANTECUBITAL  Final   Special Requests IN PEDIATRIC BOTTLE 2CC  Final   Culture   Final    NO GROWTH 3 DAYS Performed at [facility], [place] [address], [place], [place] [address]    Report Status  PENDING  Incomplete  Urine culture     Status: Abnormal   Collection Time: 05/28/16  4:06 AM  Result Value Ref Range Status   Specimen Description URINE, CLEAN CATCH  Final   Special Requests NONE  Final   Culture >=100,000 COLONIES/mL STAPHYLOCOCCUS EPIDERMIDIS (A)  Final   Report Status 05/30/2016 FINAL  Final   Organism ID, Bacteria STAPHYLOCOCCUS EPIDERMIDIS (A)  Final      Susceptibility   Staphylococcus epidermidis - MIC*    CIPROFLOXACIN 4 RESISTANT Resistant     GENTAMICIN <=0.5 SENSITIVE Sensitive     NITROFURANTOIN <=16 SENSITIVE Sensitive     OXACILLIN >=4 RESISTANT Resistant     TETRACYCLINE 2 SENSITIVE Sensitive     VANCOMYCIN 2 SENSITIVE Sensitive     TRIMETH/SULFA <=10 SENSITIVE Sensitive     CLINDAMYCIN <=0.25 SENSITIVE Sensitive     RIFAMPIN <=0.5 SENSITIVE Sensitive     Inducible Clindamycin NEGATIVE Sensitive     * >=100,000 COLONIES/mL STAPHYLOCOCCUS EPIDERMIDIS      Studies: No results found.  Scheduled Meds: . benztropine  0.5 mg Oral Daily  . enoxaparin  (LOVENOX) injection  40 mg Subcutaneous Q24H  . haloperidol  5 mg Oral BID  . lamoTRIgine  100 mg Oral BID  . pantoprazole  40 mg Oral Daily  . sodium chloride flush  3 mL Intravenous Q12H  . sulfamethoxazole-trimethoprim  1 tablet Oral Q12H    Continuous Infusions: . sodium chloride 125 mL/hr at 06/01/16 0526     LOS: 4 days    Annita Brod, MD [facility] Pager [phone]  If 7PM-7AM, please contact night-coverage [email or web address] Password [facility] 06/01/2016, 11:27 AM

## 2016-06-01 NOTE — Care Management Important Message (Signed)
Important Message  Patient Details  Name: Antonio Montoya Grizzell MRN: [id number] Date of Birth: 02-Feb-1951   Medicare Important Message Given:  Yes    Caren MacadamFuller, Sherrice Creekmore 06/01/2016, 11:13 AMImportant Message  Patient Details  Name: Antonio Montoya Bixler MRN: [id number] Date of Birth: 02-Feb-1951   Medicare Important Message Given:  Yes    Caren MacadamFuller, Nashay Brickley 06/01/2016, 11:08 AM

## 2016-06-01 NOTE — Progress Notes (Signed)
CSW following to assist with patient disposition to SNF.  [place] MUST PAS[facility], requested H&P, FL2 clinical information. Information faxed to [phone]. PASRR reviewing information.   Vivi BarrackNicole Rasheena Talmadge, Theresia MajorsLCSWA, MSW Clinical Social Worker 5E and Psychiatric Service Line [phone] 06/01/2016  10:04 AM

## 2016-06-02 LAB — CULTURE, BLOOD (ROUTINE X 2)
Culture: NO GROWTH
Culture: NO GROWTH

## 2016-06-02 LAB — BASIC METABOLIC PANEL
Anion gap: 6 (ref 5–15)
BUN: 19 mg/dL (ref 6–20)
CO2: 24 mmol/L (ref 22–32)
CREATININE: 0.94 mg/dL (ref 0.61–1.24)
Calcium: 8.7 mg/dL — ABNORMAL LOW (ref 8.9–10.3)
Chloride: 111 mmol/L (ref 101–111)
GFR calc Af Amer: >60 mL/min (ref >60)
Glucose, Bld: 82 mg/dL (ref 65–99)
Potassium: 3.9 mmol/L (ref 3.5–5.1)
SODIUM: 141 mmol/L (ref 135–145)

## 2016-06-02 NOTE — Progress Notes (Signed)
PROGRESS NOTE  Antonio Montoya WPV:[id number] DOB: January 14, 1951 DOA: 05/28/2016 PCP: Derrell Lolling, MD (Inactive)  HPI/Recap of past 25 hours: 66 year old male with past medical history of diabetes mellitus diet controlled as well as schizophrenia and mental retardation initially discharged from the hospitalist service on 3/15 brought back to the emergency room on 3/19 with acute encephalopathy and patient found to be hypothermic/hypotensive and tachycardic. He was found to have a very large urinary tract infection and admitted for sepsis secondary to UTI. Patient started on IV Rocephin. Initial plan was for patient be discharged back to group home, however they are not willing to accept him. Therefore he will need to go to a skilled nursing facility, given his mentation issues will require a PASSAR first.  On night of 3/21 and again last night, patient had a few pauses on telemetry. Patient's heart rate has been staying in the 40s, occasionally down into the 30s. Speech therapy follow-up the patient on 3/22 and further downgraded his dysphagia diet to dysphagia 1.  Patient not had when asks if he is doing okay.  Assessment/Plan:  Sepsis ([place]) secondary to urinary tract infection causing acute encephalopathy: Patient met criteria for sepsis on admission given hypotension requiring multiple fluid boluses, urinary tract infection source, tachycardia and secondary altered mentation. Following aggressive fluid resuscitation and IV antibiotics, patient improved. By 3/21 He was back at his baseline which is limited given his mental retardation and schizophrenia. He does require sitter at his group home. Urine Cultures grow positive for staph epidermidis sensitive to Bactrim which patient was started on.  Schizophrenia: Stable, continued on Cogentin and Lamictal  Moderate mental retardation: Awaiting passar for skilled nursing placement  Bradycardia: Cardiology was consulted when patient was noted to have  questionable heart block. However increase her examination, this was felt to be bradycardia. No further intervention was needed. Had reported pauses for the last 2 nights Follow-up labs note stable electrolytes. Will keep atropine at the bedside. Heart rate improving, now staying consistently in the mid to high 40s  Dysphagia: Given patient's altered mentation, seen by speech therapy. They felt that he likely had chronic dysphagia but no acute aspiration event. He was downgraded to a dysphagia 2 diet with nectar thick liquids. Speech therapy followed up on 3/22 and found that he was having difficulty with this diet. Downgraded further to dysphagia 1  Code Status: Full code   Family Communication: Tried to contact sister   Disposition Plan: Initial plan was for patient be discharged back to his group home on 3/21. However they declined to take him back and now he will need to go to skilled nursing. Given his mentation issues, will require passar before being accepted   Procedures:  None  Consultations:  Cardiology   Antimicrobials:  IV Rocephin 3/20-3/23  Bactrim 3/23-present  DVT prophylaxis: Lovenox   Objective: Vitals:   06/01/16 0525 06/01/16 1453 06/01/16 2029 06/02/16 0431  BP: (!) 169/68 (!) 148/63 117/66 116/78  Pulse: (!) 41 (!) 43 (!) 54 (!) 48  Resp: _0 Temp: 97.7 F (36.5 C) 98.3 F (36.8 C) 98 F (36.7 C) 98.6 F (37 C)  TempSrc: Oral Oral Axillary Oral  SpO2: 97% 95% 95% 95%  Weight:      Height:        Intake/Output Summary (Last 24 hours) at 06/02/16 1332 Last data filed at 06/02/16 0930  Gross per 24 hour  Intake          3807.92 ml  Output             1250 ml  Net          2557.92 ml   Filed Weights   05/28/16 2206  Weight: 72 kg (158 lb 11.7 oz)    Exam:   General:  Awake, No acute distress   Cardiovascular: Regular rhythm, bradycardic  Respiratory: Clear to auscultation bilaterally   Abdomen: Soft, nondistended,  hypoactive bowel sounds   Musculoskeletal: No clubbing or cyanosis, trace pitting edema   Psychiatry: Chronic mental retardation, no agitation    Data Reviewed: CBC:  Recent Labs Lab 05/28/16 0241  WBC 5.9  NEUTROABS 4.8  HGB 10.3*  HCT 29.3*  MCV 93.6  PLT 967   Basic Metabolic Panel:  Recent Labs Lab 05/28/16 0241 05/31/16 0838 06/02/16 0444  NA 142 145 141  K 3.7 3.9 3.9  CL 107 114* 111  CO2 _0 GLUCOSE 99 80 82  BUN 25* 25* 19  CREATININE 1.07 1.16 0.94  CALCIUM 8.9 8.9 8.7*  MG 1.9 1.8  --    GFR: Estimated Creatinine Clearance: 80.9 mL/min (by C-G formula based on SCr of 0.94 mg/dL). Liver Function Tests:  Recent Labs Lab 05/28/16 0241  AST 153*  ALT 64*  ALKPHOS 81  BILITOT 0.4  PROT 5.9*  ALBUMIN 2.8*   No results for input(s): LIPASE, AMYLASE in the last 168 hours.  Recent Labs Lab 05/28/16 0241  AMMONIA 27   Coagulation Profile:  Recent Labs Lab 05/28/16 0241  INR 1.15   Cardiac Enzymes:  Recent Labs Lab 05/28/16 0241  TROPONINI <0.03   BNP (last 3 results) No results for input(s): PROBNP in the last 8760 hours. HbA1C: No results for input(s): HGBA1C in the last 72 hours. CBG: No results for input(s): GLUCAP in the last 168 hours. Lipid Profile: No results for input(s): CHOL, HDL, LDLCALC, TRIG, CHOLHDL, LDLDIRECT in the last 72 hours. Thyroid Function Tests: No results for input(s): TSH, T4TOTAL, FREET4, T3FREE, THYROIDAB in the last 72 hours. Anemia Panel: No results for input(s): VITAMINB12, FOLATE, FERRITIN, TIBC, IRON, RETICCTPCT in the last 72 hours. Urine analysis:    Component Value Date/Time   COLORURINE YELLOW 05/28/2016 0406   APPEARANCEUR CLEAR 05/28/2016 0406   LABSPEC 1.015 05/28/2016 0406   PHURINE 6.0 05/28/2016 0406   GLUCOSEU NEGATIVE 05/28/2016 0406   HGBUR LARGE (A) 05/28/2016 0406   BILIRUBINUR NEGATIVE 05/28/2016 0406   KETONESUR NEGATIVE 05/28/2016 0406   PROTEINUR 100 (A) 05/28/2016  0406   UROBILINOGEN 1.0 01/16/2012 1957   NITRITE NEGATIVE 05/28/2016 0406   LEUKOCYTESUR NEGATIVE 05/28/2016 0406   Sepsis Labs: _1 (procalcitonin:4,lacticidven:4)  ) Recent Results (from the past 240 hour(s))  Blood culture (routine x 2)     Status: None   Collection Time: 05/28/16  2:42 AM  Result Value Ref Range Status   Specimen Description BLOOD LEFT ANTECUBITAL  Final   Special Requests BOTTLES DRAWN AEROBIC AND ANAEROBIC 5CC  Final   Culture   Final    NO GROWTH 5 DAYS Performed at [facility], [place] [address] [place], [place] [address]    Report Status 06/02/2016 FINAL  Final  Blood culture (routine x 2)     Status: None   Collection Time: 05/28/16  2:53 AM  Result Value Ref Range Status   Specimen Description BLOOD RIGHT ANTECUBITAL  Final   Special Requests IN PEDIATRIC BOTTLE [facility]  Final   Culture   Final  NO GROWTH 5 DAYS Performed at [facility], [place] [address] [place],  [address]    Report Status 06/02/2016 FINAL  Final  Urine culture     Status: Abnormal   Collection Time: 05/28/16  4:06 AM  Result Value Ref Range Status   Specimen Description URINE, CLEAN CATCH  Final   Special Requests NONE  Final   Culture >=100,000 COLONIES/mL STAPHYLOCOCCUS EPIDERMIDIS (A)  Final   Report Status 05/30/2016 FINAL  Final   Organism ID, Bacteria STAPHYLOCOCCUS EPIDERMIDIS (A)  Final      Susceptibility   Staphylococcus epidermidis - MIC*    CIPROFLOXACIN 4 RESISTANT Resistant     GENTAMICIN <=0.5 SENSITIVE Sensitive     NITROFURANTOIN <=16 SENSITIVE Sensitive     OXACILLIN >=4 RESISTANT Resistant     TETRACYCLINE 2 SENSITIVE Sensitive     VANCOMYCIN 2 SENSITIVE Sensitive     TRIMETH/SULFA <=10 SENSITIVE Sensitive     CLINDAMYCIN <=0.25 SENSITIVE Sensitive     RIFAMPIN <=0.5 SENSITIVE Sensitive     Inducible Clindamycin NEGATIVE Sensitive     * >=100,000 COLONIES/mL STAPHYLOCOCCUS EPIDERMIDIS      Studies: No results  found.  Scheduled Meds: . benztropine  0.5 mg Oral Daily  . enoxaparin (LOVENOX) injection  40 mg Subcutaneous Q24H  . haloperidol  5 mg Oral BID  . lamoTRIgine  100 mg Oral BID  . pantoprazole  40 mg Oral Daily  . sodium chloride flush  3 mL Intravenous Q12H  . sulfamethoxazole-trimethoprim  1 tablet Oral Q12H    Continuous Infusions:    LOS: 5 days    Annita Brod, MD [facility] Pager [phone]  If 7PM-7AM, please contact night-coverage [email or web address] Password [email or web address] 06/02/2016, 1:32 PM

## 2016-06-03 NOTE — Progress Notes (Signed)
PROGRESS NOTE  Antonio Montoya TDD:[id number] DOB: 1950/05/09 DOA: 05/28/2016 PCP: Derrell Lolling, MD (Inactive)  HPI/Recap of past 14 hours: 66 year old male with past medical history of diabetes mellitus diet controlled as well as schizophrenia and mental retardation initially discharged from the hospitalist service on 3/15 brought back to the emergency room on 3/19 with acute encephalopathy and patient found to be hypothermic/hypotensive and tachycardic. He was found to have a very large urinary tract infection and admitted for sepsis secondary to UTI. Patient started on IV Rocephin. Initial plan was for patient be discharged back to group home, however they are not willing to accept him. Therefore he will need to go to a skilled nursing facility, given his mentation issues will require a PASSAR first.  On night of 3/21 and again last night, patient had a few pauses on telemetry. Patient's heart rate has been staying in the 40s, occasionally down into the 30s. Speech therapy follow-up the patient on 3/22 and further downgraded his dysphagia diet to dysphagia 1.   No events overnight. Heart rate in 40s-50s. Smiles when asked how he is doing.  Assessment/Plan:  Sepsis ([place]) secondary to urinary tract infection causing acute encephalopathy: Patient met criteria for sepsis on admission given hypotension requiring multiple fluid boluses, urinary tract infection source, tachycardia and secondary altered mentation. Following aggressive fluid resuscitation and IV antibiotics, patient improved. By 3/21 He was back at his baseline which is limited given his mental retardation and schizophrenia. He does require sitter at his group home. Urine Cultures grow positive for staph epidermidis sensitive to Bactrim which patient was started on.  Schizophrenia: Stable, continued on Cogentin and Lamictal  Moderate mental retardation: Awaiting passar for skilled nursing placement  Bradycardia: Cardiology was  consulted when patient was noted to have questionable heart block. However increase her examination, this was felt to be bradycardia. No further intervention was needed. Had reported pauses for the last 2 nights Follow-up labs note stable electrolytes. Will keep atropine at the bedside. Heart rate improving, now staying consistently in the 40s-50s  Dysphagia: Given patient's altered mentation, seen by speech therapy. They felt that he likely had chronic dysphagia but no acute aspiration event. He was downgraded to a dysphagia 2 diet with nectar thick liquids. Speech therapy followed up on 3/22 and found that he was having difficulty with this diet. Downgraded further to dysphagia 1  Code Status: Full code   Family Communication: Left message for sister  Disposition Plan: Initial plan was for patient be discharged back to his group home on 3/21. However they declined to take him back and now he will need to go to skilled nursing. Given his mentation issues, will require passar before being accepted   Procedures:  None  Consultations:  Cardiology   Antimicrobials:  IV Rocephin 3/20-3/23  Bactrim 3/23-present  DVT prophylaxis: Lovenox   Objective: Vitals:   06/02/16 0431 06/02/16 1403 06/02/16 2028 06/03/16 0518  BP: 116/78 (!) 115/94 (!) 113/51 127/68  Pulse: (!) 48 (!) 112 (!) 51 (!) 48  Resp: '20 20 20 18  '$ Temp: 98.6 F (37 C) 98.6 F (37 C) 99.1 F (37.3 C) 97.8 F (36.6 C)  TempSrc: Oral Oral Oral Oral  SpO2: 95% 96% 95% 94%  Weight:      Height:        Intake/Output Summary (Last 24 hours) at 06/03/16 1249 Last data filed at 06/03/16 0533  Gross per 24 hour  Intake  240 ml  Output             1200 ml  Net             -960 ml   Filed Weights   05/28/16 2206  Weight: 72 kg (158 lb 11.7 oz)    Exam:   General:  Awake, No acute distress   Cardiovascular: Regular rhythm, bradycardic  Respiratory: Clear to auscultation bilaterally    Abdomen: Soft, nondistended, hypoactive bowel sounds   Musculoskeletal: No clubbing or cyanosis, trace pitting edema   Psychiatry: Chronic mental retardation, no agitation    Data Reviewed: CBC:  Recent Labs Lab 05/28/16 0241  WBC 5.9  NEUTROABS 4.8  HGB 10.3*  HCT 29.3*  MCV 93.6  PLT 818   Basic Metabolic Panel:  Recent Labs Lab 05/28/16 0241 05/31/16 0838 06/02/16 0444  NA 142 145 141  K 3.7 3.9 3.9  CL 107 114* 111  CO2 '30 27 24  '$ GLUCOSE 99 80 82  BUN 25* 25* 19  CREATININE 1.07 1.16 0.94  CALCIUM 8.9 8.9 8.7*  MG 1.9 1.8  --    GFR: Estimated Creatinine Clearance: 80.9 mL/min (by C-G formula based on SCr of 0.94 mg/dL). Liver Function Tests:  Recent Labs Lab 05/28/16 0241  AST 153*  ALT 64*  ALKPHOS 81  BILITOT 0.4  PROT 5.9*  ALBUMIN 2.8*   No results for input(s): LIPASE, AMYLASE in the last 168 hours.  Recent Labs Lab 05/28/16 0241  AMMONIA 27   Coagulation Profile:  Recent Labs Lab 05/28/16 0241  INR 1.15   Cardiac Enzymes:  Recent Labs Lab 05/28/16 0241  TROPONINI <0.03   BNP (last 3 results) No results for input(s): PROBNP in the last 8760 hours. HbA1C: No results for input(s): HGBA1C in the last 72 hours. CBG: No results for input(s): GLUCAP in the last 168 hours. Lipid Profile: No results for input(s): CHOL, HDL, LDLCALC, TRIG, CHOLHDL, LDLDIRECT in the last 72 hours. Thyroid Function Tests: No results for input(s): TSH, T4TOTAL, FREET4, T3FREE, THYROIDAB in the last 72 hours. Anemia Panel: No results for input(s): VITAMINB12, FOLATE, FERRITIN, TIBC, IRON, RETICCTPCT in the last 72 hours. Urine analysis:    Component Value Date/Time   COLORURINE YELLOW 05/28/2016 0406   APPEARANCEUR CLEAR 05/28/2016 0406   LABSPEC 1.015 05/28/2016 0406   PHURINE 6.0 05/28/2016 0406   GLUCOSEU NEGATIVE 05/28/2016 0406   HGBUR LARGE (A) 05/28/2016 0406   BILIRUBINUR NEGATIVE 05/28/2016 0406   KETONESUR NEGATIVE 05/28/2016  0406   PROTEINUR 100 (A) 05/28/2016 0406   UROBILINOGEN 1.0 01/16/2012 1957   NITRITE NEGATIVE 05/28/2016 0406   LEUKOCYTESUR NEGATIVE 05/28/2016 0406   Sepsis Labs: '@LABRCNTIP'$ (procalcitonin:4,lacticidven:4)  ) Recent Results (from the past 240 hour(s))  Blood culture (routine x 2)     Status: None   Collection Time: 05/28/16  2:42 AM  Result Value Ref Range Status   Specimen Description BLOOD LEFT ANTECUBITAL  Final   Special Requests BOTTLES DRAWN AEROBIC AND ANAEROBIC 5CC  Final   Culture   Final    NO GROWTH 5 DAYS Performed at [facility], [place] [address] [place], [place] [address]    Report Status 06/02/2016 FINAL  Final  Blood culture (routine x 2)     Status: None   Collection Time: 05/28/16  2:53 AM  Result Value Ref Range Status   Specimen Description BLOOD RIGHT ANTECUBITAL  Final   Special Requests IN PEDIATRIC BOTTLE [facility]  Final  Culture   Final    NO GROWTH 5 DAYS Performed at [facility], [place] [address], [place], [place] [address]    Report Status 06/02/2016 FINAL  Final  Urine culture     Status: Abnormal   Collection Time: 05/28/16  4:06 AM  Result Value Ref Range Status   Specimen Description URINE, CLEAN CATCH  Final   Special Requests NONE  Final   Culture >=100,000 COLONIES/mL STAPHYLOCOCCUS EPIDERMIDIS (A)  Final   Report Status 05/30/2016 FINAL  Final   Organism ID, Bacteria STAPHYLOCOCCUS EPIDERMIDIS (A)  Final      Susceptibility   Staphylococcus epidermidis - MIC*    CIPROFLOXACIN 4 RESISTANT Resistant     GENTAMICIN <=0.5 SENSITIVE Sensitive     NITROFURANTOIN <=16 SENSITIVE Sensitive     OXACILLIN >=4 RESISTANT Resistant     TETRACYCLINE 2 SENSITIVE Sensitive     VANCOMYCIN 2 SENSITIVE Sensitive     TRIMETH/SULFA <=10 SENSITIVE Sensitive     CLINDAMYCIN <=0.25 SENSITIVE Sensitive     RIFAMPIN <=0.5 SENSITIVE Sensitive     Inducible Clindamycin NEGATIVE Sensitive     * >=100,000 COLONIES/mL STAPHYLOCOCCUS EPIDERMIDIS       Studies: No results found.  Scheduled Meds: . benztropine  0.5 mg Oral Daily  . enoxaparin (LOVENOX) injection  40 mg Subcutaneous Q24H  . haloperidol  5 mg Oral BID  . lamoTRIgine  100 mg Oral BID  . pantoprazole  40 mg Oral Daily  . sodium chloride flush  3 mL Intravenous Q12H  . sulfamethoxazole-trimethoprim  1 tablet Oral Q12H    Continuous Infusions:    LOS: 6 days    Annita Brod, MD [facility] Pager [phone]  If 7PM-7AM, please contact night-coverage [email or web address] Password [facility] 06/03/2016, 12:49 PM

## 2016-06-03 NOTE — Clinical Social Work Note (Signed)
CSW contacted pt sister, Antonio Montoya-([phone]) to provide bed offers. Sister is not familiar with any facilities provided and stated pt has case workers through [facility] that assist with placement. Sister asked that CSW mail facility listing with offers to sister at [address], [place], [place] [id number]. Sister stated she would speak with pt's case workers on Monday for help with decision. CSW checked [facility] today. PASRR number still pending. Unit CSW will continue to follow for d/c needs.   Antonio Montoya, Antonio Montoya, Antonio Montoya Weekend Social Worker  [phone]

## 2016-06-04 DIAGNOSIS — G259 Extrapyramidal and movement disorder, unspecified: Secondary | ICD-10-CM | POA: Diagnosis not present

## 2016-06-04 DIAGNOSIS — F919 Conduct disorder, unspecified: Secondary | ICD-10-CM | POA: Diagnosis not present

## 2016-06-04 DIAGNOSIS — F39 Unspecified mood [affective] disorder: Secondary | ICD-10-CM | POA: Diagnosis not present

## 2016-06-04 DIAGNOSIS — I10 Essential (primary) hypertension: Secondary | ICD-10-CM | POA: Diagnosis not present

## 2016-06-04 DIAGNOSIS — M6281 Muscle weakness (generalized): Secondary | ICD-10-CM | POA: Diagnosis not present

## 2016-06-04 DIAGNOSIS — R7881 Bacteremia: Secondary | ICD-10-CM | POA: Diagnosis present

## 2016-06-04 DIAGNOSIS — F71 Moderate intellectual disabilities: Secondary | ICD-10-CM | POA: Diagnosis not present

## 2016-06-04 DIAGNOSIS — R419 Unspecified symptoms and signs involving cognitive functions and awareness: Secondary | ICD-10-CM | POA: Diagnosis not present

## 2016-06-04 DIAGNOSIS — J9811 Atelectasis: Secondary | ICD-10-CM | POA: Diagnosis not present

## 2016-06-04 DIAGNOSIS — R1310 Dysphagia, unspecified: Secondary | ICD-10-CM | POA: Diagnosis not present

## 2016-06-04 DIAGNOSIS — F0281 Dementia in other diseases classified elsewhere with behavioral disturbance: Secondary | ICD-10-CM | POA: Diagnosis not present

## 2016-06-04 DIAGNOSIS — R262 Difficulty in walking, not elsewhere classified: Secondary | ICD-10-CM | POA: Diagnosis not present

## 2016-06-04 DIAGNOSIS — A4189 Other specified sepsis: Secondary | ICD-10-CM | POA: Diagnosis not present

## 2016-06-04 DIAGNOSIS — R1319 Other dysphagia: Secondary | ICD-10-CM | POA: Diagnosis not present

## 2016-06-04 DIAGNOSIS — R4182 Altered mental status, unspecified: Secondary | ICD-10-CM | POA: Diagnosis not present

## 2016-06-04 DIAGNOSIS — R41841 Cognitive communication deficit: Secondary | ICD-10-CM | POA: Diagnosis not present

## 2016-06-04 DIAGNOSIS — R278 Other lack of coordination: Secondary | ICD-10-CM | POA: Diagnosis not present

## 2016-06-04 DIAGNOSIS — G9340 Encephalopathy, unspecified: Secondary | ICD-10-CM | POA: Diagnosis not present

## 2016-06-04 DIAGNOSIS — F201 Disorganized schizophrenia: Secondary | ICD-10-CM | POA: Diagnosis not present

## 2016-06-04 DIAGNOSIS — F419 Anxiety disorder, unspecified: Secondary | ICD-10-CM | POA: Diagnosis not present

## 2016-06-04 DIAGNOSIS — N390 Urinary tract infection, site not specified: Secondary | ICD-10-CM | POA: Diagnosis not present

## 2016-06-04 DIAGNOSIS — G9349 Other encephalopathy: Secondary | ICD-10-CM | POA: Diagnosis not present

## 2016-06-04 DIAGNOSIS — R001 Bradycardia, unspecified: Secondary | ICD-10-CM | POA: Diagnosis not present

## 2016-06-04 DIAGNOSIS — F79 Unspecified intellectual disabilities: Secondary | ICD-10-CM | POA: Diagnosis not present

## 2016-06-04 DIAGNOSIS — A412 Sepsis due to unspecified staphylococcus: Secondary | ICD-10-CM | POA: Diagnosis not present

## 2016-06-04 DIAGNOSIS — F209 Schizophrenia, unspecified: Secondary | ICD-10-CM | POA: Diagnosis not present

## 2016-06-04 DIAGNOSIS — F918 Other conduct disorders: Secondary | ICD-10-CM | POA: Diagnosis not present

## 2016-06-04 DIAGNOSIS — Z79899 Other long term (current) drug therapy: Secondary | ICD-10-CM | POA: Diagnosis not present

## 2016-06-04 LAB — CREATININE, SERUM
CREATININE: 1.01 mg/dL (ref 0.61–1.24)
GFR calc Af Amer: >60 mL/min (ref >60)
GFR calc non Af Amer: >60 mL/min (ref >60)

## 2016-06-04 MED ORDER — LORAZEPAM 1 MG PO TABS: 0.5000 mg | ORAL_TABLET | Freq: Four times a day (QID) | ORAL | 0 refills | Status: DC | PRN

## 2016-06-04 MED ORDER — SULFAMETHOXAZOLE-TRIMETHOPRIM 800-160 MG PO TABS: 1.0000 | ORAL_TABLET | Freq: Two times a day (BID) | ORAL | 0 refills | Status: AC

## 2016-06-04 NOTE — Progress Notes (Deleted)
PROGRESS NOTE  Antonio Montoya HER:[id number] DOB: Dec 05, 1950 DOA: 05/28/2016 PCP: Derrell Lolling, MD (Inactive)  HPI/Recap of past 38 hours: 66 year old male with past medical history of diabetes mellitus diet controlled as well as schizophrenia and mental retardation initially discharged from the hospitalist service on 3/15 brought back to the emergency room on 3/19 with acute encephalopathy and patient found to be hypothermic/hypotensive and tachycardic. He was found to have a very large urinary tract infection and admitted for sepsis secondary to UTI. Patient started on IV Rocephin. Initial plan was for patient be discharged back to group home, however they are not willing to accept him. Therefore he will need to go to a skilled nursing facility, given his mentation issues will require a PASSAR first.  On night of 3/21 and again last night, patient had a few pauses on telemetry. Patient's heart rate has been staying in the 40s, occasionally down into the 30s. Speech therapy follow-up the patient on 3/22 and further downgraded his dysphagia diet to dysphagia 1.   No events overnight. Heart rate in 40s-50s. Patient appears happy.  Assessment/Plan:  Sepsis ([place]) secondary to urinary tract infection causing acute encephalopathy: Patient met criteria for sepsis on admission given hypotension requiring multiple fluid boluses, urinary tract infection source, tachycardia and secondary altered mentation. Following aggressive fluid resuscitation and IV antibiotics, patient improved. By 3/21 He was back at his baseline which is limited given his mental retardation and schizophrenia. He does require sitter at his group home. Urine Cultures grow positive for staph epidermidis sensitive to Bactrim which patient was started on.  Schizophrenia: Stable, continued on Cogentin and Lamictal  Moderate mental retardation: Awaiting passar for skilled nursing placement  Bradycardia: Cardiology was consulted when  patient was noted to have questionable heart block. However increase her examination, this was felt to be bradycardia. No further intervention was needed. Had reported pauses for the last 2 nights Follow-up labs note stable electrolytes. Will keep atropine at the bedside. Heart rate improving, now staying consistently in the 40s-50s  Dysphagia: Given patient's altered mentation, seen by speech therapy. They felt that he likely had chronic dysphagia but no acute aspiration event. He was downgraded to a dysphagia 2 diet with nectar thick liquids. Speech therapy followed up on 3/22 and found that he was having difficulty with this diet. Downgraded further to dysphagia 1  Code Status: Full code   Family Communication: Left message for sister  Disposition Plan: Initial plan was for patient be discharged back to his group home on 3/21. However they declined to take him back and now he will need to go to skilled nursing. Given his mentation issues, will require passar before being accepted   Procedures:  None  Consultations:  Cardiology   Antimicrobials:  IV Rocephin 3/20-3/23  Bactrim 3/23-present (go until 4/1)  DVT prophylaxis: Lovenox   Objective: Vitals:   06/03/16 0518 06/03/16 1447 06/03/16 2107 06/04/16 0552  BP: 127/68 (!) 121/58 (!) 144/51 126/73  Pulse: (!) 48 (!) 55 (!) 40 (!) 43  Resp: _0 Temp: 97.8 F (36.6 C) 97.8 F (36.6 C) 98.3 F (36.8 C) 97.9 F (36.6 C)  TempSrc: Oral Oral Oral Tympanic  SpO2: 94% 95% 96% 96%  Weight:      Height:        Intake/Output Summary (Last 24 hours) at 06/04/16 1127 Last data filed at 06/04/16 0953  Gross per 24 hour  Intake  480 ml  Output                2 ml  Net              478 ml   Filed Weights   05/28/16 2206  Weight: 72 kg (158 lb 11.7 oz)    Exam: Unchanged   General:  Awake, No acute distress   Cardiovascular: Regular rhythm, bradycardic  Respiratory: Clear to auscultation  bilaterally   Abdomen: Soft, nondistended, hypoactive bowel sounds   Musculoskeletal: No clubbing or cyanosis, trace pitting edema   Psychiatry: Chronic mental retardation, no agitation    Data Reviewed: CBC: No results for input(s): WBC, NEUTROABS, HGB, HCT, MCV, PLT in the last 168 hours. Basic Metabolic Panel:  Recent Labs Lab 05/31/16 0838 06/02/16 0444 06/04/16 0627  NA 145 141  --   K 3.9 3.9  --   CL 114* 111  --   CO2 27 24  --   GLUCOSE 80 82  --   BUN 25* 19  --   CREATININE 1.16 0.94 1.01  CALCIUM 8.9 8.7*  --   MG 1.8  --   --    GFR: Estimated Creatinine Clearance: 75.2 mL/min (by C-G formula based on SCr of 1.01 mg/dL). Liver Function Tests: No results for input(s): AST, ALT, ALKPHOS, BILITOT, PROT, ALBUMIN in the last 168 hours. No results for input(s): LIPASE, AMYLASE in the last 168 hours. No results for input(s): AMMONIA in the last 168 hours. Coagulation Profile: No results for input(s): INR, PROTIME in the last 168 hours. Cardiac Enzymes: No results for input(s): CKTOTAL, CKMB, CKMBINDEX, TROPONINI in the last 168 hours. BNP (last 3 results) No results for input(s): PROBNP in the last 8760 hours. HbA1C: No results for input(s): HGBA1C in the last 72 hours. CBG: No results for input(s): GLUCAP in the last 168 hours. Lipid Profile: No results for input(s): CHOL, HDL, LDLCALC, TRIG, CHOLHDL, LDLDIRECT in the last 72 hours. Thyroid Function Tests: No results for input(s): TSH, T4TOTAL, FREET4, T3FREE, THYROIDAB in the last 72 hours. Anemia Panel: No results for input(s): VITAMINB12, FOLATE, FERRITIN, TIBC, IRON, RETICCTPCT in the last 72 hours. Urine analysis:    Component Value Date/Time   COLORURINE YELLOW 05/28/2016 0406   APPEARANCEUR CLEAR 05/28/2016 0406   LABSPEC 1.015 05/28/2016 0406   PHURINE 6.0 05/28/2016 0406   GLUCOSEU NEGATIVE 05/28/2016 0406   HGBUR LARGE (A) 05/28/2016 0406   BILIRUBINUR NEGATIVE 05/28/2016 0406   KETONESUR  NEGATIVE 05/28/2016 0406   PROTEINUR 100 (A) 05/28/2016 0406   UROBILINOGEN 1.0 01/16/2012 1957   NITRITE NEGATIVE 05/28/2016 0406   LEUKOCYTESUR NEGATIVE 05/28/2016 0406   Sepsis Labs: _0 (procalcitonin:4,lacticidven:4)  ) Recent Results (from the past 240 hour(s))  Blood culture (routine x 2)     Status: None   Collection Time: 05/28/16  2:42 AM  Result Value Ref Range Status   Specimen Description BLOOD LEFT ANTECUBITAL  Final   Special Requests BOTTLES DRAWN AEROBIC AND ANAEROBIC 5CC  Final   Culture   Final    NO GROWTH 5 DAYS Performed at [facility], [place] [address] [place], [place] [address]    Report Status 06/02/2016 FINAL  Final  Blood culture (routine x 2)     Status: None   Collection Time: 05/28/16  2:53 AM  Result Value Ref Range Status   Specimen Description BLOOD RIGHT ANTECUBITAL  Final   Special Requests IN PEDIATRIC BOTTLE [facility]  Final  Culture   Final    NO GROWTH 5 DAYS Performed at [facility], [place] [address] [place], [place] [address]    Report Status 06/02/2016 FINAL  Final  Urine culture     Status: Abnormal   Collection Time: 05/28/16  4:06 AM  Result Value Ref Range Status   Specimen Description URINE, CLEAN CATCH  Final   Special Requests NONE  Final   Culture >=100,000 COLONIES/mL STAPHYLOCOCCUS EPIDERMIDIS (A)  Final   Report Status 05/30/2016 FINAL  Final   Organism ID, Bacteria STAPHYLOCOCCUS EPIDERMIDIS (A)  Final      Susceptibility   Staphylococcus epidermidis - MIC*    CIPROFLOXACIN 4 RESISTANT Resistant     GENTAMICIN <=0.5 SENSITIVE Sensitive     NITROFURANTOIN <=16 SENSITIVE Sensitive     OXACILLIN >=4 RESISTANT Resistant     TETRACYCLINE 2 SENSITIVE Sensitive     VANCOMYCIN 2 SENSITIVE Sensitive     TRIMETH/SULFA <=10 SENSITIVE Sensitive     CLINDAMYCIN <=0.25 SENSITIVE Sensitive     RIFAMPIN <=0.5 SENSITIVE Sensitive     Inducible Clindamycin NEGATIVE Sensitive     * >=100,000 COLONIES/mL STAPHYLOCOCCUS  EPIDERMIDIS      Studies: No results found.  Scheduled Meds: . benztropine  0.5 mg Oral Daily  . enoxaparin (LOVENOX) injection  40 mg Subcutaneous Q24H  . haloperidol  5 mg Oral BID  . lamoTRIgine  100 mg Oral BID  . pantoprazole  40 mg Oral Daily  . sodium chloride flush  3 mL Intravenous Q12H  . sulfamethoxazole-trimethoprim  1 tablet Oral Q12H    Continuous Infusions:    LOS: 7 days    Annita Brod, MD [facility] Pager [phone]  If 7PM-7AM, please contact night-coverage [email or web address] Password [facility] 06/04/2016, 11:27 AM

## 2016-06-04 NOTE — Progress Notes (Signed)
PASRR received- [id number] CSW spoke with patient sister SNF decision. Patient sister agreeable to [facility]. Patient will need a PT consult prior to discharging-physician informed.  CSW placed call to Kim-owner of residential services and updated her per request of sister. She provided CSW with patient Care Coordinator at [facility] to update as well.  Patient sister will fill out admission paperwork at facility. Patient will transport by PTAR.   Vivi BarrackNicole Khamauri Bauernfeind, Theresia MajorsLCSWA, MSW Clinical Social Worker 5E and Psychiatric Service Line [phone] 06/04/2016  12:01 PM

## 2016-06-04 NOTE — Progress Notes (Signed)
  Speech Language Pathology Treatment:    Patient Details Name: Antonio Montoya MRN: [id number] DOB: 1950-07-30 Today's Date: 06/04/2016 Time: [id number] SLP Time Calculation (min) (ACUTE ONLY): 22 min  Assessment / Plan / Recommendation Clinical Impression  Pt seen to assess po tolerance and readiness for dietary advancement.   Rn reports she was helping pt with meal and he was tolerating well.   SLP observed pt with nectar via straw, pudding, and cracker.  Vertical mastication pattern with cracker and pt placed entire cracker in his mouth.  Prolonged oral holding/transiting noted but use of liquids facilitates clearance.  No oral residuals present at completion of snack.    As pt much improved today, recommend to advance diet to dys3/nectar with compensation strategies.  SLP posted new dysphagia mitigation strategies in room for pt.  Suspect swallow may be near baseline function.  Will follow up for dysphagia management.  [facility]    HPI HPI: 66 yo male [facility] with [facility]argy, AMS.  Pt PMH + for schizophrenia, MR.  Pt imaging studies were negative - CT head, CXR.  Swallow evaluation ordered.        SLP Plan  Continue with current plan of care       Recommendations  Diet recommendations: Dysphagia 3 (mechanical soft);Nectar-thick liquid Liquids provided via: Cup;Straw;Teaspoon Medication Administration: Whole meds with puree Supervision: Full supervision/cueing for compensatory strategies;Staff to assist with self feeding Compensations: Minimize environmental distractions;Slow rate;Small sips/bites (use liquids to faciliate swallow if pt oral holding, assure pt swallows, clean mouth after meals, oral suction prn) Postural Changes and/or Swallow Maneuvers: Seated upright 90 degrees;Upright 30-60 min after meal                Oral Care Recommendations: Oral care before and after PO Follow up Recommendations: Skilled Nursing facility SLP Visit  Diagnosis: Dysphagia, oropharyngeal phase (R13.12) Plan: Continue with current plan of care       GO                [name] Garnet Chatmon, MS [facility] SLP [phone]

## 2016-06-04 NOTE — Progress Notes (Signed)
Nutrition Follow-up  DOCUMENTATION CODES:   Not applicable  INTERVENTION:  - Continue Magic Cup BID. - Continue to encourage PO intakes and provide meal assistance.   NUTRITION DIAGNOSIS:   Swallowing difficulty related to dysphagia as evidenced by other (see comment) (Need for Dysphagia 3, nectar-thick diet). -ongoing  GOAL:   Patient will meet greater than or equal to 90% of their needs -met on average  MONITOR:   PO intake, Supplement acceptance, Weight trends, Labs  ASSESSMENT:   66 y.o. male with medical history significant of mental retardation and schizophrenia, was brought to ED for lethargy, altered mental status. He is not responding to verbal cues or on sternal rub he would transiently groan. No care giver or family at bedside, called both the caregiver and his sister phone number and left messages to call back. Most of the history was from EDP notes. On arrival to ED he was found to be hypothermic, hypotensive, and hypoxic, initially tachycardic and then became bradycardic. His BP responded to fluid boluses and his bp parameters are 90/ 50's with MAP greater than 65.   3/26 Per chart review, pt has been consuming 50-75% of meals since previous assessment. Diet during previous assessment was Dysphagia 3 with nectar-thick liquids. Diet was changed to Dysphagia 1 with nectar-thick liquids the following day until after lunch today (upgraded to Dysphagia 3 with nectar-thick liquids) per SLP recommendation. No new weight since admission. Pt to go to SNF; d/c order in place and d/c summary done yesterday.   Medications reviewed; 40 mg oral Protonix/day. Labs reviewed; Ca: 8.7 mg/dL.   3/21 - Per chart review, pt consumed 100% of lunch and dinner yesterday.  - Spoke with Tech who fed pt this AM.  - She reports good appetite this AM and ate most of his food; he did take a little longer to swallow but otherwise no difficulties with meals.  - She is familiar with pt from  previous admission (last month) and states he always eats well for her.  - No family/visitors present at this time and pt is noted to be mainly nonverbal.  - Physical assessment shows no muscle and no fat wasting, mild edema to extremities.  - Per chart review, weight +2 lbs over the past 1 month and no other weight recordings available at this time.  IVF: NS @ 125 mL/hr.     Diet Order:  Diet - low sodium heart healthy DIET - DYS 1 DIET DYS 3 Room service appropriate? Yes; Fluid consistency: Nectar Thick  Skin:  Wound (see comment) (Stage 2 R hip pressure injury)  Last BM:  3/22  Height:   Ht Readings from Last 1 Encounters:  05/28/16 6' (1.829 m)    Weight:   Wt Readings from Last 1 Encounters:  05/28/16 158 lb 11.7 oz (72 kg)    Ideal Body Weight:  83.64 kg  BMI:  Body mass index is 21.53 kg/m.  Estimated Nutritional Needs:   Kcal:  1585-1800 (22-25 kcal/kg)  Protein:  65-75 grams  Fluid:  1.6-1.8 L/day  EDUCATION NEEDS:   No education needs identified at this time    Jarome Matin, MS, RD, LDN, CNSC Inpatient Clinical Dietitian Pager # [phone] After hours/weekend pager # [phone]

## 2016-06-04 NOTE — Care Management Note (Signed)
Case Management Note  Patient Details  Name: Antonio Montoya MRN: [id number] Date of Birth: [birth year]  Subjective/Objective:                    Action/Plan:d/c SNF.   Expected Discharge Date:  06/04/16               Expected Discharge Plan:  Skilled Nursing Facility  In-House Referral:  Clinical Social Work  Discharge planning Services  CM Consult  Post Acute Care Choice:  NA Choice offered to:  NA  DME Arranged:  N/A DME Agency:  NA  HH Arranged:  NA HH Agency:  NA  Status of Service:  Completed, signed off  If discussed at [employer] Length of Stay Meetings, dates discussed:    Additional Comments:  Lanier ClamMahabir, Eusevio Schriver, RN 06/04/2016, 1:29 PM

## 2016-06-04 NOTE — Progress Notes (Signed)
PT Cancellation Note  Patient Details Name: Mart PiggsCharlie Nazari MRN: [id number] DOB: 1950/11/09   Cancelled Treatment:    Reason Eval/Treat Not Completed: Other (comment);PT screened, Spoke to Vivi BarrackNicole Sinclair, MSW. Patient has a bed at Skilled care. Recommend that patient is evaluated by facility for PT and determine The skilled  Needs. In past, the patient has not been able to participate due to MS. The facility will be able to address his needs as determined by PT. No further acute PT  At this time. For DC today. EschbachKaren Ashanna Heinsohn PT [phone]   [name], Latina Frank Elizabeth 06/04/2016, 2:28 PM

## 2016-06-04 NOTE — Discharge Summary (Signed)
Discharge Summary  Antonio Montoya LZJ:[id number] DOB: 01-05-1951  PCP: Derrell Lolling, MD (Inactive)  Admit date: 05/28/2016 Discharge date: 06/04/2016  Time spent: 25 minutes   Recommendations for Outpatient Follow-up:  1. Patient being discharged to skilled nursing facility 2. New medication: Bactrim 800/160 mg by mouth every 12 hours until 4/1 3. Diet now downgraded to dysphagia 1 with nectar thick liquids   Discharge Diagnoses:  Active Hospital Problems   Diagnosis Date Noted  . Sepsis ([place]) 05/28/2016  . Altered mental status/Acute encephalopathy  05/28/2016  Moderate mental retardation Urinary tract infection   Resolved Hospital Problems   Diagnosis Date Noted Date Resolved  No resolved problems to display.    Discharge Condition: Improved, being discharged back to skilled nursing facility   Diet recommendation: Dysphagia 1 with nectar thick liquids   Vitals:   06/03/16 2107 06/04/16 0552  BP: (!) 144/51 126/73  Pulse: (!) 40 (!) 43  Resp: 20   Temp: 98.3 F (36.8 C) 97.9 F (36.6 C)    History of present illness:  66 year old male with past medical history of diabetes mellitus diet controlled as well as schizophrenia and mental retardation initially discharged from the hospitalist service on 3/15 brought back to the emergency room on 3/19 with acute encephalopathy and patient found to be hypothermic/hypotensive and tachycardic. He was found to have a very large urinary tract infection and admitted for sepsis secondary to UTI. Patient Initially started on IV Rocephin.  Hospital Course:  Active Problems:   Sepsis ([place]) secondary to urinary tract infection causing acute encephalopathy: Patient met criteria for sepsis on admission given hypotension requiring multiple fluid boluses, urinary tract infection source, tachycardia and secondary altered mentation. Following aggressive fluid resuscitation and IV antibiotics, patient improved. By 3/21 He was back at his baseline  which is limited given his mental retardation and schizophrenia. He does require sitter at his group home. Urine Cultures grow positive for staph epidermidis sensitive to Bactrim and resistant to Rocephin so patient was changed over to Bactrim.  Schizophrenia: Stable, continued on Cogentin and Lamictal  Moderate mental retardation: Patient not accepted at his group home.  Bradycardia: Cardiology was consulted when patient was noted to have questionable heart block. However increase her examination, this was felt to be bradycardia. No further intervention was needed.  Since his entire hospitalization, his heart rate has been in the 40s-50s and he has been asymptomatic from this. Thyroid function studies were normal.  Dysphagia: Given patient's altered mentation, seen by speech therapy. They felt that he likely had chronic dysphagia but no acute aspiration event. He was downgraded to a dysphagia 1 diet with nectar thick liquids. He will be discharged on this diet.   Procedures:  None  Consultations:  Cardiology   Discharge Exam: BP 126/73 (BP Location: Right Arm)   Pulse (!) 43   Temp 97.9 F (36.6 C) (Tympanic)   Resp 20   Ht 6' (1.829 m)   Wt 72 kg (158 lb 11.7 oz)   SpO2 96%   BMI 21.53 kg/m   General: Awake, oriented 1  Cardiovascular: Regular rhythm, bradycardia  Respiratory: Clear to auscultation bilaterally   Discharge Instructions You were cared for by a hospitalist during your hospital stay. If you have any questions about your discharge medications or the care you received while you were in the hospital after you are discharged, you can call the unit and asked to speak with the hospitalist on call if the hospitalist that took care of you is not  available. Once you are discharged, your primary care physician will handle any further medical issues. Please note that NO REFILLS for any discharge medications will be authorized once you are discharged, as it is imperative that  you return to your primary care physician (or establish a relationship with a primary care physician if you do not have one) for your aftercare needs so that they can reassess your need for medications and monitor your lab values.  Discharge Instructions    Bed rest    Complete by:  As directed    DIET - DYS 1    Complete by:  As directed    Fluid consistency:  Nectar Thick   Diet - low sodium heart healthy    Complete by:  As directed    Increase activity slowly    Complete by:  As directed      Allergies as of 06/04/2016   No Known Allergies     Medication List    STOP taking these medications   HYDROcodone-acetaminophen 10-325 MG tablet Commonly known as:  NORCO     TAKE these medications   acetaminophen 325 MG tablet Commonly known as:  TYLENOL Take 2 tablets (650 mg total) by mouth every 6 (six) hours as needed for mild pain (or Fever >/= 101).   asenapine 5 MG Subl 24 hr tablet Commonly known as:  SAPHRIS Place 1 tablet (5 mg total) under the tongue every 12 (twelve) hours as needed (for agitation).   benztropine 0.5 MG tablet Commonly known as:  COGENTIN Take 1 tablet (0.5 mg total) by mouth every evening. What changed:  when to take this   haloperidol 5 MG tablet Commonly known as:  HALDOL Take 1 tablet (5 mg total) by mouth 2 (two) times daily.   lamoTRIgine 100 MG tablet Commonly known as:  LAMICTAL Take 1 tablet (100 mg total) by mouth 2 (two) times daily.   LORazepam 1 MG tablet Commonly known as:  ATIVAN Take 0.5 tablets (0.5 mg total) by mouth every 6 (six) hours as needed for anxiety. What changed:  medication strength  when to take this  reasons to take this  Another medication with the same name was removed. Continue taking this medication, and follow the directions you see here.   omeprazole 20 MG capsule Commonly known as:  PRILOSEC Take 20 mg by mouth every morning.   potassium chloride 10 MEQ tablet Commonly known as:   K-DUR,KLOR-CON Take 10 mEq by mouth every morning.   RESOURCE THICKENUP CLEAR Powd Use as directed-All liquids nectar thick   sulfamethoxazole-trimethoprim 800-160 MG tablet Commonly known as:  BACTRIM DS,SEPTRA DS Take 1 tablet by mouth every 12 (twelve) hours.      No Known Allergies    The results of significant diagnostics from this hospitalization (including imaging, microbiology, ancillary and laboratory) are listed below for reference.    Significant Diagnostic Studies: Dg Chest 1 View  Result Date: 05/28/2016 CLINICAL DATA:  66-year-old male with altered mental status EXAM: CHEST 1 VIEW COMPARISON:  Chest radiograph dated 05/02/2016 FINDINGS: The heart size and mediastinal contours are within normal limits. Both lungs are clear. The visualized skeletal structures are unremarkable. IMPRESSION: No active disease. Electronically Signed   By: Arash  Radparvar M.D.   On: 05/28/2016 03:10   Ct Head Wo Contrast  Result Date: 05/28/2016 CLINICAL DATA:  66-year-old male with altered mental status. EXAM: CT HEAD WITHOUT CONTRAST TECHNIQUE: Contiguous axial images were obtained from the base of the skull   through the vertex without intravenous contrast. COMPARISON:  Head CT dated 04/13/2016 FINDINGS: Brain: The ventricles and sulci are appropriate in size for the patient's age. Mild periventricular and deep white matter chronic microvascular ischemic changes noted. There is no acute intracranial hemorrhage. No mass effect or midline shift. No extra-axial fluid collection. Vascular: No hyperdense vessel or unexpected calcification. Skull: Normal. Negative for fracture or focal lesion. Sinuses/Orbits: Minimal mucoperiosteal thickening of paranasal sinuses. No air-fluid level. There is partial opacification of the left mastoid air cells. The right mastoid air cells are clear. Other: None IMPRESSION: 1. No acute intracranial hemorrhage. 2. Mild age-related atrophy and chronic microvascular ischemic  changes. If symptoms persist, and there are no contraindications, MRI may provide better evaluation if clinically indicated. Electronically Signed   By: Arash  Radparvar M.D.   On: 05/28/2016 03:25    Microbiology: Recent Results (from the past 240 hour(s))  Blood culture (routine x 2)     Status: None   Collection Time: 05/28/16  2:42 AM  Result Value Ref Range Status   Specimen Description BLOOD LEFT ANTECUBITAL  Final   Special Requests BOTTLES DRAWN AEROBIC AND ANAEROBIC 5CC  Final   Culture   Final    NO GROWTH 5 DAYS Performed at [facility], [address] [place], [place] [address]    Report Status 06/02/2016 FINAL  Final  Blood culture (routine x 2)     Status: None   Collection Time: 05/28/16  2:53 AM  Result Value Ref Range Status   Specimen Description BLOOD RIGHT ANTECUBITAL  Final   Special Requests IN PEDIATRIC BOTTLE 2CC  Final   Culture   Final    NO GROWTH 5 DAYS Performed at [facility], [address] [place], [place] [address]    Report Status 06/02/2016 FINAL  Final  Urine culture     Status: Abnormal   Collection Time: 05/28/16  4:06 AM  Result Value Ref Range Status   Specimen Description URINE, CLEAN CATCH  Final   Special Requests NONE  Final   Culture >=100,000 COLONIES/mL STAPHYLOCOCCUS EPIDERMIDIS (A)  Final   Report Status 05/30/2016 FINAL  Final   Organism ID, Bacteria STAPHYLOCOCCUS EPIDERMIDIS (A)  Final      Susceptibility   Staphylococcus epidermidis - MIC*    CIPROFLOXACIN 4 RESISTANT Resistant     GENTAMICIN <=0.5 SENSITIVE Sensitive     NITROFURANTOIN <=16 SENSITIVE Sensitive     OXACILLIN >=4 RESISTANT Resistant     TETRACYCLINE 2 SENSITIVE Sensitive     VANCOMYCIN 2 SENSITIVE Sensitive     TRIMETH/SULFA <=10 SENSITIVE Sensitive     CLINDAMYCIN <=0.25 SENSITIVE Sensitive     RIFAMPIN <=0.5 SENSITIVE Sensitive     Inducible Clindamycin NEGATIVE Sensitive     * >=100,000 COLONIES/mL STAPHYLOCOCCUS EPIDERMIDIS      Labs: Basic Metabolic Panel:  Recent Labs Lab 05/31/16 0838 06/02/16 0444 06/04/16 0627  NA 145 141  --   K 3.9 3.9  --   CL 114* 111  --   CO2 27 24  --   GLUCOSE 80 82  --   BUN 25* 19  --   CREATININE 1.16 0.94 1.01  CALCIUM 8.9 8.7*  --   MG 1.8  --   --    Liver Function Tests: No results for input(s): AST, ALT, ALKPHOS, BILITOT, PROT, ALBUMIN in the last 168 hours. No results for input(s): LIPASE, AMYLASE in the last 168 hours. No results for input(s):   AMMONIA in the last 168 hours. CBC: No results for input(s): WBC, NEUTROABS, HGB, HCT, MCV, PLT in the last 168 hours. Cardiac Enzymes: No results for input(s): CKTOTAL, CKMB, CKMBINDEX, TROPONINI in the last 168 hours. BNP: BNP (last 3 results) No results for input(s): BNP in the last 8760 hours.  ProBNP (last 3 results) No results for input(s): PROBNP in the last 8760 hours.  CBG: No results for input(s): GLUCAP in the last 168 hours.     Signed:  Annita Brod, MD [facility] 06/04/2016, 11:42 AM

## 2016-06-04 NOTE — Progress Notes (Addendum)
Sister completed admissions paperwork. Updated SNF facility about PT recommendation. PTAR called for transport. Will be an hour before pick up.   4:40 CSW recievd call from Marchelle FolksAmanda, [facility]. CSW updated her about patient care and disposition plan.  She plans to follow up with patient at facility.   Vivi BarrackNicole Zyhir Cappella, Theresia MajorsLCSWA, MSW Clinical Social Worker 5E and Psychiatric Service Line [phone] 06/04/2016  2:36 PM

## 2016-06-04 NOTE — Clinical Social Work Placement (Signed)
   CLINICAL SOCIAL WORK PLACEMENT  NOTE  Date:  06/04/2016  Patient Details  Name: Antonio Montoya MRN: [id number] Date of Birth: 04/21/1950  Clinical Social Work is seeking post-discharge placement for this patient at the Skilled  Nursing Facility level of care (*CSW will initial, date and re-position this form in  chart as items are completed):  Yes   Patient/family provided with [facility] Clinical Social Work Department's list of facilities offering this level of care within the geographic area requested by the patient (or if unable, by the patient's family).  Yes   Patient/family informed of their freedom to choose among providers that offer the needed level of care, that participate in Medicare, Medicaid or managed care program needed by the patient, have an available bed and are willing to accept the patient.  Yes   Patient/family informed of [facility]'s ownership interest in [facility] and [facility], as well as of the fact that they are under no obligation to receive care at these facilities.  PASRR submitted to EDS on 05/31/16     PASRR number received on 06/04/16     Existing PASRR number confirmed on       FL2 transmitted to all facilities in geographic area requested by pt/family on       FL2 transmitted to all facilities within larger geographic area on       Patient informed that his/her managed care company has contracts with or will negotiate with certain facilities, including the following:  [facility]     No   Patient/family informed of bed offers received.  [facility]     [facility]    [facility]  Patient to be transferred to facility by [facility]     Patient family notified on 06/04/16 of transfer.  Name of family member notified:  Sister Stella  PHYSICIAN Please prepare/sign prescriptions     Additional Comment:    _______________________________________________ Clearance CootsNicole A Anzleigh Slaven, LCSW 06/04/2016, 12:59 PM

## 2016-06-04 NOTE — Progress Notes (Addendum)
[facility]. All questions answered. [facility]

## 2016-06-06 DIAGNOSIS — F79 Unspecified intellectual disabilities: Secondary | ICD-10-CM | POA: Diagnosis not present

## 2016-06-06 DIAGNOSIS — N390 Urinary tract infection, site not specified: Secondary | ICD-10-CM | POA: Diagnosis not present

## 2016-06-06 DIAGNOSIS — F209 Schizophrenia, unspecified: Secondary | ICD-10-CM | POA: Diagnosis not present

## 2016-06-06 DIAGNOSIS — G9340 Encephalopathy, unspecified: Secondary | ICD-10-CM | POA: Diagnosis not present

## 2016-06-21 DIAGNOSIS — G259 Extrapyramidal and movement disorder, unspecified: Secondary | ICD-10-CM | POA: Diagnosis not present

## 2016-06-21 DIAGNOSIS — F79 Unspecified intellectual disabilities: Secondary | ICD-10-CM | POA: Diagnosis not present

## 2016-06-21 DIAGNOSIS — F201 Disorganized schizophrenia: Secondary | ICD-10-CM | POA: Diagnosis not present

## 2016-06-21 DIAGNOSIS — F39 Unspecified mood [affective] disorder: Secondary | ICD-10-CM | POA: Diagnosis not present

## 2016-06-27 DIAGNOSIS — F209 Schizophrenia, unspecified: Secondary | ICD-10-CM | POA: Diagnosis not present

## 2016-06-27 DIAGNOSIS — F79 Unspecified intellectual disabilities: Secondary | ICD-10-CM | POA: Diagnosis not present

## 2016-06-27 DIAGNOSIS — R1310 Dysphagia, unspecified: Secondary | ICD-10-CM | POA: Diagnosis not present

## 2016-07-05 DIAGNOSIS — G259 Extrapyramidal and movement disorder, unspecified: Secondary | ICD-10-CM | POA: Diagnosis not present

## 2016-07-05 DIAGNOSIS — F79 Unspecified intellectual disabilities: Secondary | ICD-10-CM | POA: Diagnosis not present

## 2016-07-05 DIAGNOSIS — F419 Anxiety disorder, unspecified: Secondary | ICD-10-CM | POA: Diagnosis not present

## 2016-07-05 DIAGNOSIS — F201 Disorganized schizophrenia: Secondary | ICD-10-CM | POA: Diagnosis not present

## 2016-07-19 ENCOUNTER — Encounter (HOSPITAL_COMMUNITY): Payer: Self-pay | Admitting: Emergency Medicine

## 2016-07-19 ENCOUNTER — Emergency Department (HOSPITAL_COMMUNITY): Payer: Medicare Other

## 2016-07-19 ENCOUNTER — Emergency Department (HOSPITAL_COMMUNITY)
Admission: EM | Admit: 2016-07-19 | Discharge: 2016-07-25 | Disposition: A | Discharge status: Home / Self Care | Payer: Medicare Other | Attending: Emergency Medicine | Admitting: Emergency Medicine

## 2016-07-19 DIAGNOSIS — R05 Cough: Secondary | ICD-10-CM | POA: Diagnosis not present

## 2016-07-19 DIAGNOSIS — R059 Cough, unspecified: Secondary | ICD-10-CM

## 2016-07-19 DIAGNOSIS — F0391 Unspecified dementia with behavioral disturbance: Secondary | ICD-10-CM | POA: Diagnosis not present

## 2016-07-19 DIAGNOSIS — Z79899 Other long term (current) drug therapy: Secondary | ICD-10-CM | POA: Diagnosis not present

## 2016-07-19 DIAGNOSIS — F03918 Unspecified dementia, unspecified severity, with other behavioral disturbance: Secondary | ICD-10-CM | POA: Diagnosis present

## 2016-07-19 DIAGNOSIS — J9811 Atelectasis: Secondary | ICD-10-CM | POA: Diagnosis not present

## 2016-07-19 DIAGNOSIS — R4589 Other symptoms and signs involving emotional state: Secondary | ICD-10-CM | POA: Diagnosis not present

## 2016-07-19 DIAGNOSIS — F918 Other conduct disorders: Secondary | ICD-10-CM | POA: Insufficient documentation

## 2016-07-19 DIAGNOSIS — R4689 Other symptoms and signs involving appearance and behavior: Secondary | ICD-10-CM

## 2016-07-19 LAB — CBC WITH DIFFERENTIAL/PLATELET
BASOS ABS: 0 10*3/uL (ref 0.0–0.1)
BASOS PCT: 0 %
Eosinophils Absolute: 0.1 10*3/uL (ref 0.0–0.7)
Eosinophils Relative: 2 %
HEMATOCRIT: 35.9 % — AB (ref 39.0–52.0)
HEMOGLOBIN: 12.3 g/dL — AB (ref 13.0–17.0)
LYMPHS PCT: 20 %
Lymphs Abs: 1.7 10*3/uL (ref 0.7–4.0)
MCH: 33.2 pg (ref 26.0–34.0)
MCHC: 34.3 g/dL (ref 30.0–36.0)
MCV: 96.8 fL (ref 78.0–100.0)
MONO ABS: 0.7 10*3/uL (ref 0.1–1.0)
Monocytes Relative: 8 %
NEUTROS ABS: 5.7 10*3/uL (ref 1.7–7.7)
NEUTROS PCT: 70 %
Platelets: 198 10*3/uL (ref 150–400)
RBC: 3.71 MIL/uL — AB (ref 4.22–5.81)
RDW: 13.3 % (ref 11.5–15.5)
WBC: 8.2 10*3/uL (ref 4.0–10.5)

## 2016-07-19 LAB — URINALYSIS, ROUTINE W REFLEX MICROSCOPIC
BACTERIA UA: NONE SEEN
Bilirubin Urine: NEGATIVE
GLUCOSE, UA: NEGATIVE mg/dL
KETONES UR: NEGATIVE mg/dL
LEUKOCYTES UA: NEGATIVE
Nitrite: NEGATIVE
PROTEIN: NEGATIVE mg/dL
SQUAMOUS EPITHELIAL / LPF: NONE SEEN
Specific Gravity, Urine: 1.008 (ref 1.005–1.030)
pH: 7 (ref 5.0–8.0)

## 2016-07-19 LAB — COMPREHENSIVE METABOLIC PANEL
ALT: 6 U/L — ABNORMAL LOW (ref 17–63)
AST: 30 U/L (ref 15–41)
Albumin: 3.7 g/dL (ref 3.5–5.0)
Alkaline Phosphatase: 74 U/L (ref 38–126)
Anion gap: 8 (ref 5–15)
BUN: 21 mg/dL — ABNORMAL HIGH (ref 6–20)
CHLORIDE: 106 mmol/L (ref 101–111)
CO2: 25 mmol/L (ref 22–32)
CREATININE: 0.82 mg/dL (ref 0.61–1.24)
Calcium: 9.2 mg/dL (ref 8.9–10.3)
Glucose, Bld: 88 mg/dL (ref 65–99)
POTASSIUM: 4 mmol/L (ref 3.5–5.1)
Sodium: 139 mmol/L (ref 135–145)
Total Bilirubin: 0.7 mg/dL (ref 0.3–1.2)
Total Protein: 6.9 g/dL (ref 6.5–8.1)

## 2016-07-19 LAB — ETHANOL

## 2016-07-19 MED ORDER — PANTOPRAZOLE SODIUM 40 MG PO TBEC
40.0000 mg | DELAYED_RELEASE_TABLET | Freq: Every day | ORAL | Status: DC
  Administered 2016-07-21 – 2016-07-25 (×5): 40 mg via ORAL
  Filled 2016-07-19 (×6): qty 1

## 2016-07-19 MED ORDER — HALOPERIDOL 5 MG PO TABS
5.0000 mg | ORAL_TABLET | Freq: Two times a day (BID) | ORAL | Status: DC
  Administered 2016-07-19: 5 mg via ORAL
  Filled 2016-07-19 (×2): qty 1

## 2016-07-19 MED ORDER — BENZTROPINE MESYLATE 0.5 MG PO TABS
0.5000 mg | ORAL_TABLET | Freq: Every evening | ORAL | Status: DC
  Administered 2016-07-19 – 2016-07-24 (×6): 0.5 mg via ORAL
  Filled 2016-07-19 (×6): qty 1

## 2016-07-19 MED ORDER — POTASSIUM CHLORIDE CRYS ER 10 MEQ PO TBCR
10.0000 meq | EXTENDED_RELEASE_TABLET | Freq: Every day | ORAL | Status: DC
  Administered 2016-07-21 – 2016-07-25 (×5): 10 meq via ORAL
  Filled 2016-07-19 (×6): qty 1

## 2016-07-19 MED ORDER — LORAZEPAM 0.5 MG PO TABS
0.5000 mg | ORAL_TABLET | Freq: Two times a day (BID) | ORAL | Status: DC
  Administered 2016-07-19: 0.5 mg via ORAL
  Filled 2016-07-19 (×2): qty 1

## 2016-07-19 MED ORDER — ASENAPINE MALEATE 2.5 MG SL SUBL
2.5000 mg | SUBLINGUAL_TABLET | Freq: Two times a day (BID) | SUBLINGUAL | Status: DC
  Administered 2016-07-19: 2.5 mg via SUBLINGUAL
  Filled 2016-07-19 (×2): qty 1

## 2016-07-19 MED ORDER — HALOPERIDOL LACTATE 5 MG/ML IJ SOLN
5.0000 mg | Freq: Once | INTRAMUSCULAR | Status: AC
  Administered 2016-07-19: 5 mg via INTRAMUSCULAR
  Filled 2016-07-19: qty 1

## 2016-07-19 MED ORDER — ASENAPINE MALEATE 5 MG SL SUBL
5.0000 mg | SUBLINGUAL_TABLET | Freq: Two times a day (BID) | SUBLINGUAL | Status: DC
  Administered 2016-07-19 – 2016-07-23 (×7): 5 mg via SUBLINGUAL
  Filled 2016-07-19 (×7): qty 1

## 2016-07-19 MED ORDER — LAMOTRIGINE 25 MG PO TABS
150.0000 mg | ORAL_TABLET | Freq: Two times a day (BID) | ORAL | Status: DC
  Administered 2016-07-19 – 2016-07-23 (×7): 150 mg via ORAL
  Filled 2016-07-19 (×8): qty 2

## 2016-07-19 NOTE — ED Notes (Signed)
Unable to collect PT labs at this time due to PT behavior

## 2016-07-19 NOTE — ED Notes (Signed)
Attempted to do In and out catheter. Pt too agitated and fought against staff during procedure. Unable to get sample
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
## 2016-07-19 NOTE — ED Notes (Signed)
Bed: [id number] Expected date:  Expected time:  Means of arrival:  Comments: IVC, violent

## 2016-07-19 NOTE — [facility] Counselor (Signed)
Clinician attempted to contact the pt's social worker who initiated the IVC paperwork, to gather additional collateral infromation. Clinician was unable to leave a HIPPA compliant voicemail.    Antonio Passereylese D Bennett, MS, [facility], [facility] Triage Specialist [phone]
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
## 2016-07-19 NOTE — ED Provider Notes (Signed)
[facility]-EMERGENCY DEPT Provider Note   CSN: [id number] Arrival date & time: 07/19/16  1220     History   Chief Complaint Chief Complaint  Patient presents with  . Aggressive Behavior  Level V caveat due to dementia and schizophrenia. HPI Antonio Montoya is a 66 y.o. male.  HPI Patient presents under IVC from his group home. Has reportedly been violent and agitated with people there. Has been throwing chairs. IVC by the Child psychotherapist. History of dementia and schizophrenia. Also has had previous urinary tract infections.   Past Medical History:  Diagnosis Date  . MR (mental retardation)   . Schizophrenia [facility])     Patient Active Problem List   Diagnosis Date Noted  . Staphylococcus epidermidis bacteremia 06/04/2016  . Urinary tract infection without hematuria   . MR (mental retardation) 05/30/2016  . Dysphagia 05/30/2016  . Altered mental status 05/28/2016  . Urinary retention 05/04/2016  . Thrombocytopenia (HCC) 05/04/2016  . Pressure injury of skin 05/02/2016  . Hypothermia 05/02/2016  . Elevated LFTs   . Acute delirium 05/01/2016    Past Surgical History:  Procedure Laterality Date  . CYSTOSCOPY N/A 05/18/2016   Procedure: CYSTOSCOPY;  Surgeon: Ihor Gully, MD;  Location: [facility];  Service: Urology;  Laterality: N/A;  . TRANSURETHRAL RESECTION OF PROSTATE N/A 05/18/2016   Procedure: TRANSURETHRAL RESECTION OF THE PROSTATE (TURP);  Surgeon: Ihor Gully, MD;  Location: [facility] ORS;  Service: Urology;  Laterality: N/A;       Home Medications    Prior to Admission medications   Medication Sig Start Date End Date Taking? Authorizing Provider  acetaminophen (TYLENOL) 325 MG tablet Take 2 tablets (650 mg total) by mouth every 6 (six) hours as needed for mild pain (or Fever >/= 101). 05/19/16  Yes Calvert Cantor, MD  asenapine (SAPHRIS) 5 MG SUBL 24 hr tablet Place 5 mg under the tongue 2 (two) times daily. Pt takes with a 2.5mg  tablet.   Yes [provider]  Asenapine  Maleate (SAPHRIS) 2.5 MG SUBL Place 2.5 mg under the tongue 2 (two) times daily. Pt takes with a 5mg  tablet.   Yes [provider]  benztropine (COGENTIN) 0.5 MG tablet Take 1 tablet (0.5 mg total) by mouth every evening. 05/30/16  Yes Hollice Espy, MD  haloperidol (HALDOL) 5 MG tablet Take 1 tablet (5 mg total) by mouth 2 (two) times daily. 05/24/16  Yes Rhetta Mura, MD  lamoTRIgine (LAMICTAL) 150 MG tablet Take 150 mg by mouth 2 (two) times daily.   Yes [provider]  LORazepam (ATIVAN) 0.5 MG tablet Take 0.5 mg by mouth 2 (two) times daily.   Yes [provider]  omeprazole (PRILOSEC) 20 MG capsule Take 20 mg by mouth daily before breakfast.    Yes [provider]  potassium chloride (K-DUR,KLOR-CON) 10 MEQ tablet Take 10 mEq by mouth daily.    Yes [provider]    Family History Family History  Problem Relation Age of Onset  . Family history unknown: Yes    Social History Social History  Substance Use Topics  . Smoking status: Never Smoker  . Smokeless tobacco: Never Used  . Alcohol use No     Allergies   Patient has no known allergies.   Review of Systems Review of Systems  Unable to perform ROS: Dementia     Physical Exam Updated Vital Signs BP (!) 159/98   Pulse (!) 55   Temp 97.4 F (36.3 C) (Oral)   Resp  18   SpO2 100%   Physical Exam  Constitutional: He appears well-developed.  HENT:  Head: Atraumatic.  Neck: Neck supple.  Cardiovascular: Normal rate.   Pulmonary/Chest: Effort normal.  Abdominal: There is no tenderness.  Musculoskeletal: He exhibits no edema.  Neurological:  Patient is mostly nonverbal for me. Will not follow commands. Will look at me. He does grab at my arm when I am listening to his chest.  Skin: Capillary refill takes less than 2 seconds.     ED Treatments / Results  Labs (all labs ordered are listed, but only abnormal results are displayed) Labs Reviewed    COMPREHENSIVE METABOLIC PANEL - Abnormal; Notable for the following:       Result Value   BUN 21 (*)    ALT 6 (*)    All other components within normal limits  CBC WITH DIFFERENTIAL/PLATELET - Abnormal; Notable for the following:    RBC 3.71 (*)    Hemoglobin 12.3 (*)    HCT 35.9 (*)    All other components within normal limits  ETHANOL  URINALYSIS, ROUTINE W REFLEX MICROSCOPIC    EKG  EKG Interpretation None       Radiology No results found.  Procedures Procedures (including critical care time)  Medications Ordered in ED Medications  haloperidol lactate (HALDOL) injection 5 mg (5 mg Intramuscular Given 07/19/16 1437)     Initial Impression / Assessment and Plan / ED Course  I have reviewed the triage vital signs and the nursing notes.  Pertinent labs & imaging results that were available during my care of the patient were reviewed by me and considered in my medical decision making (see chart for details).     Patient brought in under involuntary commitment. Has been combative at his nursing home or group home. Lab work reassuring but urinalysis pending. Will need urine before clearance. If urine is possible infection may require admission otherwise to be seen by TTS. Care turned over to Dr Estell HarpinZammit  Final Clinical Impressions(s) / ED Diagnoses   Final diagnoses:  Aggressive behavior    New Prescriptions New Prescriptions   No medications on file     Benjiman CorePickering, Yaniel Limbaugh, MD 07/19/16 [phone]

## 2016-07-20 DIAGNOSIS — F0391 Unspecified dementia with behavioral disturbance: Secondary | ICD-10-CM

## 2016-07-20 DIAGNOSIS — F03918 Unspecified dementia, unspecified severity, with other behavioral disturbance: Secondary | ICD-10-CM | POA: Diagnosis present

## 2016-07-20 NOTE — BH Assessment (Signed)
[facility] Assessment Progress Note  This Clinical research associatewriter has spoken to the following individuals to obtain necessary records and to communicate pt's plan of care:  Ralph DowdyStella Womble (cell: [phone]; home: [phone]): pt's sister/legal guardian.  She is named on pt's Letters of Appointment Guardian of the Person, [facility].  A copy of this document is on pt's chart.  She has been informed that pt is under IVC and [facility] at [facility].  She asks to be kept apprised of how pt's disposition develops.  I have provided her with the phone number to the TCU, where pt is currently located.  Talmage Napmanda Monahan [phone]): pt's Care Coordinator with [employer].  She agrees to try to locate pt's psychometric testing, in case it is necessary to seek psychiatric hospitalization for pt.  As of this writing, receipt is pending.  Marchelle Folksmanda also identified Selena BattenKim with [phone]) as pt's Residential Provider.  No call has been placed to her at this time.    Doylene Canninghomas Marita Burnsed, MA Triage Specialist [phone]
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
## 2016-07-20 NOTE — ED Notes (Signed)
Reported that pt. Has been refusing vital signs checking . Awake, no s/s of distress noted . With sitter at bedside.

## 2016-07-20 NOTE — ED Notes (Signed)
Bed: [id number] Expected date:  Expected time:  Means of arrival:  Comments: Rm: 16

## 2016-07-20 NOTE — BH Assessment (Addendum)
[facility] Assessment Progress Note  This Clinical research associatewriter reviewed pt's chart, seeking guardianship documentation, as well as psychometric testing to establish that pt meets clinical criteria for an IDD diagnosis.  Neither of these could be found in pt's record.  I then called [name] [phone]), pt's residence, and spoke to PaiaMon.  In subsequent calls I also spoke to Rochele PagesSusan Brand, the social worker that petitioned for pt's IVC.  They faxed to me pt's Admission Record, but as of this writing have not been able to locate either of these documents.  I asked Mon if they are holding pt's bed, and she replied, "I don't think so."  Doylene Canninghomas Emir Nack, MA Triage Specialist [phone]
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
## 2016-07-20 NOTE — ED Notes (Signed)
Pt still sleeping. Will give pt meds when pt awakes.

## 2016-07-20 NOTE — ED Notes (Signed)
Belongings bag in assigned cabinet location

## 2016-07-20 NOTE — ED Notes (Signed)
Pt resting quietly at this time with even, unlabored resp. Sitter at bedside. VSS. Will continue to monitor.

## 2016-07-21 ENCOUNTER — Encounter (HOSPITAL_COMMUNITY): Payer: Self-pay | Admitting: Registered Nurse

## 2016-07-21 DIAGNOSIS — F0281 Dementia in other diseases classified elsewhere with behavioral disturbance: Secondary | ICD-10-CM

## 2016-07-21 DIAGNOSIS — F0391 Unspecified dementia with behavioral disturbance: Secondary | ICD-10-CM | POA: Diagnosis not present

## 2016-07-21 DIAGNOSIS — R4589 Other symptoms and signs involving emotional state: Secondary | ICD-10-CM

## 2016-07-21 DIAGNOSIS — R4689 Other symptoms and signs involving appearance and behavior: Secondary | ICD-10-CM | POA: Insufficient documentation

## 2016-07-21 MED ORDER — PANTOPRAZOLE SODIUM 40 MG PO TBEC: 40.0000 mg | DELAYED_RELEASE_TABLET | Freq: Every day | ORAL | 0 refills | Status: AC

## 2016-07-21 NOTE — [facility] Suicide Risk Assessment (Cosign Needed)
Suicide Risk Assessment  Discharge Assessment   [facility]   Principal Problem: Dementia with behavioral disturbance Discharge Diagnoses:  Patient Active Problem List   Diagnosis Date Noted  . Dementia with behavioral disturbance [F03.91] 07/20/2016  . Staphylococcus epidermidis bacteremia [R78.81] 06/04/2016  . Urinary tract infection without hematuria [N39.0]   . MR (mental retardation) [F79] 05/30/2016  . Dysphagia [R13.10] 05/30/2016  . Altered mental status [R41.82] 05/28/2016  . Urinary retention [R33.9] 05/04/2016  . Thrombocytopenia ([facility]) [D69.6] 05/04/2016  . Pressure injury of skin [L89.90] 05/02/2016  . Hypothermia [T68.XXXA] 05/02/2016  . Elevated LFTs [R79.89]   . Acute delirium [R41.0] 05/01/2016    Total Time spent with patient: 30 minutes  Musculoskeletal: Strength & Muscle Tone: within normal limits Gait & Station: unsteady Patient leans: N/A  Psychiatric Specialty Exam:   Blood pressure 132/79, pulse 63, temperature 98.9 F (37.2 C), temperature source Oral, resp. rate 18, SpO2 98 %.There is no height or weight on file to calculate BMI.   General Appearance: Casual  Eye Contact:  Minimal  Speech:  Would not speak today  Volume:  Would not speak  Mood:  Calm  Affect:  Constricted  Thought Process:  Disorganized  Orientation:  Other:  person only  Thought Content:  Illogical  Suicidal Thoughts:  No  Homicidal Thoughts:  No  Memory:  Immediate;   Poor Recent;   Poor Remote;   Poor  Judgement:  Poor  Insight:  Lacking  Psychomotor Activity:  Psychomotor Retardation  Concentration:  Concentration: Poor and Attention Span: Poor  Recall:  Poor  Fund of Knowledge:  Poor  Language:  Poor  Akathisia:  No  Handed:  Right  AIMS (if indicated):     Assets:  Social Support  ADL's:  Impaired  Cognition:  Impaired,  Moderate and Severe  Sleep:         Mental Status Per Nursing Assessment::   On Admission:      Demographic Factors:  Male  Loss Factors: NA  Historical Factors: Impulsivity  Risk Reduction Factors:   Living with another person, especially a relative and Positive social support  Continued Clinical Symptoms:  Previous Psychiatric Diagnoses and Treatments Medical Diagnoses and Treatments/Surgeries  Cognitive Features That Contribute To Risk:  None    Suicide Risk:  Minimal: No identifiable suicidal ideation.  Patients presenting with no risk factors but with morbid ruminations; may be classified as minimal risk based on the severity of the depressive symptoms    Plan Of Care/Follow-up recommendations:  Activity:  As tolerated Diet:  Heart healthy  Jaanvi Fizer, NP 07/21/2016, 11:11 AM

## 2016-07-21 NOTE — Progress Notes (Signed)
CSW spoke with front desk staff member Antonio Montoya, who reported that staff are waiting to hear back from admissions staff member Antonio Montoya in regards to patient returning to facility. CSW provided with [name]'s contact information [phone]). CSW contacted Antonio Montoya, [name] reported that patient has to be sitter free for 24 hours prior to returning and patient will have to be evaluated by the facility's doctor and assessed by facility staff in the hospital prior to returning. Antonio Montoya reported that she is not able to make the decision for the patient to return to the facility and that she has to speak with the administrator. Antonio Montoya reported that it patient will not be able to return prior to Monday due to patient needing evaluation by the facility doctor.   Antonio Montoya, LCSWA [facility]  Clinical Social Worker [phone]

## 2016-07-21 NOTE — ED Notes (Signed)
Patient refusing vital signs and blood draw.

## 2016-07-21 NOTE — Consult Note (Signed)
[facility] Consult   Reason for Consult:  Aggressive Behavior Referring Physician:  EDP Patient Identification: Antonio Montoya MRN:  [id number] Principal Diagnosis: Dementia with behavioral disturbance Diagnosis:   Patient Active Problem List   Diagnosis Date Noted  . Dementia with behavioral disturbance [F03.91] 07/20/2016  . Staphylococcus epidermidis bacteremia [R78.81] 06/04/2016  . Urinary tract infection without hematuria [N39.0]   . MR (mental retardation) [F79] 05/30/2016  . Dysphagia [R13.10] 05/30/2016  . Altered mental status [R41.82] 05/28/2016  . Urinary retention [R33.9] 05/04/2016  . Thrombocytopenia ([place]) [D69.6] 05/04/2016  . Pressure injury of skin [L89.90] 05/02/2016  . Hypothermia [T68.XXXA] 05/02/2016  . Elevated LFTs [R79.89]   . Acute delirium [R41.0] 05/01/2016    Total Time spent with patient: 45 minutes  Subjective:   Antonio Montoya is a 66 y.o. male patient [facility] to [facility] under IVC with complaints of aggressive behavior toward staff of skilled nursing facility.  HPI:  Antonio Montoya 66 y.o. male patient seen by Dr. De Nurse and this provider.  Chart reviewed and face to face evaluation on 07/21/16.   On evaluation:  Antonio Montoya lying in bed with eyes open.  No verbal communication; he continues not to offer any explanation for his behavior; but is calm and cooperative.  Spoke with nursing staff who reported that there has been no aggressive behavior other than last night when patient tried to spit on staff trying to give him his medication; but he did take his medication this morning and that there has been no aggressive behavior or outburst.    Past Psychiatric History: Dementia and Schizophrenia who lives in a residential facility. Patient was brought to [facility] under IVC taking out by his Education officer, museum. Per IVC paperwork: "Respondent diagnosed with Intellectual Disability and Dementia  Risk to Self: Suicidal Ideation:   (UTA) Suicidal Intent:  (UTA) Is patient at risk for suicide?:  (UTA) Suicidal Plan?:  (UTA) Access to Means:  (UTA) What has been your use of drugs/alcohol within the last 12 months?: UTA How many times?:  (UTA) Other Self Harm Risks: UTA Triggers for Past Attempts: Other (Comment) (UTA) Intentional Self Injurious Behavior:  (UTA) Risk to Others: Homicidal Ideation:  (UTA) Thoughts of Harm to Others:  (UTA) Current Homicidal Intent:  (UTA) Current Homicidal Plan:  (UTA) Access to Homicidal Means:  (UTA) Identified Victim: UTA History of harm to others?:  (UTA) Assessment of Violence: On admission Violent Behavior Description: Per IVC paperwork, Pt is aggressive towards staff and other residents over the past two days.  Does patient have access to weapons?:  ([place]) Criminal Charges Pending?:  (UTA) Does patient have a court date:  (UTA) Prior Inpatient Therapy: Prior Inpatient Therapy:  (UTA) Prior Therapy Dates: UTA Prior Therapy Facilty/Provider(s): UTA Reason for Treatment: UTA Prior Outpatient Therapy: Prior Outpatient Therapy:  (UTA) Prior Therapy Dates: UTA Prior Therapy Facilty/Provider(s): UTA Reason for Treatment: UTA Does patient have an ACCT team?:  (UTA) Does patient have Intensive In-House Services?  :  (UTA) Does patient have [facility] services? :  (UTA) Does patient have P4CC services?:  (UTA)  Past Medical History:  Past Medical History:  Diagnosis Date  . MR (mental retardation)   . Schizophrenia [facility])     Past Surgical History:  Procedure Laterality Date  . CYSTOSCOPY N/A 05/18/2016   Procedure: CYSTOSCOPY;  Surgeon: Kathie Rhodes, MD;  Location: [facility]S;  Service: Urology;  Laterality: N/A;  . TRANSURETHRAL RESECTION OF PROSTATE N/A 05/18/2016   Procedure: TRANSURETHRAL RESECTION OF THE  PROSTATE (TURP);  Surgeon: Kathie Rhodes, MD;  Location: [facility];  Service: Urology;  Laterality: N/A;   Family History:  Family History  Problem Relation Age of Onset  .  Family history unknown: Yes   Family Psychiatric  History: Unknown Social History:  History  Alcohol Use No     History  Drug Use No    Social History   Social History  . Marital status: Single    Spouse name: N/A  . Number of children: N/A  . Years of education: N/A   Social History Main Topics  . Smoking status: Never Smoker  . Smokeless tobacco: Never Used  . Alcohol use No  . Drug use: No  . Sexual activity: No   Other Topics Concern  . None   Social History Narrative  . None   Additional Social History:    Allergies:  No Known Allergies  Labs:  Results for orders placed or performed during the hospital encounter of 07/19/16 (from the past 48 hour(s))  Comprehensive metabolic panel     Status: Abnormal   Collection Time: 07/19/16  1:23 PM  Result Value Ref Range   Sodium 139 135 - 145 mmol/L   Potassium 4.0 3.5 - 5.1 mmol/L   Chloride 106 101 - 111 mmol/L   CO2 25 22 - 32 mmol/L   Glucose, Bld 88 65 - 99 mg/dL   BUN 21 (H) 6 - 20 mg/dL   Creatinine, Ser 0.82 0.61 - 1.24 mg/dL   Calcium 9.2 8.9 - 10.3 mg/dL   Total Protein 6.9 6.5 - 8.1 g/dL   Albumin 3.7 3.5 - 5.0 g/dL   AST 30 15 - 41 U/L   ALT 6 (L) 17 - 63 U/L   Alkaline Phosphatase 74 38 - 126 U/L   Total Bilirubin 0.7 0.3 - 1.2 mg/dL   GFR calc non Af Amer >60 >60 mL/min   GFR calc Af Amer >60 >60 mL/min    Comment: (NOTE) The eGFR has been calculated using the CKD EPI equation. This calculation has not been validated in all clinical situations. eGFR's persistently <60 mL/min signify possible Chronic Kidney Disease.    Anion gap 8 5 - 15  Ethanol     Status: None   Collection Time: 07/19/16  1:23 PM  Result Value Ref Range   Alcohol, Ethyl (B) <5 <5 mg/dL    Comment:        LOWEST DETECTABLE LIMIT FOR SERUM ALCOHOL IS 5 mg/dL FOR MEDICAL PURPOSES ONLY   Urinalysis, Routine w reflex microscopic     Status: Abnormal   Collection Time: 07/19/16  1:23 PM  Result Value Ref Range   Color,  Urine STRAW (A) YELLOW   APPearance CLEAR CLEAR   Specific Gravity, Urine 1.008 1.005 - 1.030   pH 7.0 5.0 - 8.0   Glucose, UA NEGATIVE NEGATIVE mg/dL   Hgb urine dipstick SMALL (A) NEGATIVE   Bilirubin Urine NEGATIVE NEGATIVE   Ketones, ur NEGATIVE NEGATIVE mg/dL   Protein, ur NEGATIVE NEGATIVE mg/dL   Nitrite NEGATIVE NEGATIVE   Leukocytes, UA NEGATIVE NEGATIVE   RBC / HPF 6-30 0 - 5 RBC/hpf   WBC, UA 0-5 0 - 5 WBC/hpf   Bacteria, UA NONE SEEN NONE SEEN   Squamous Epithelial / LPF NONE SEEN NONE SEEN  CBC with Differential     Status: Abnormal   Collection Time: 07/19/16  1:23 PM  Result Value Ref Range   WBC 8.2 4.0 - 10.5  K/uL   RBC 3.71 (L) 4.22 - 5.81 MIL/uL   Hemoglobin 12.3 (L) 13.0 - 17.0 g/dL   HCT 35.9 (L) 39.0 - 52.0 %   MCV 96.8 78.0 - 100.0 fL   MCH 33.2 26.0 - 34.0 pg   MCHC 34.3 30.0 - 36.0 g/dL   RDW 13.3 11.5 - 15.5 %   Platelets 198 150 - 400 K/uL   Neutrophils Relative % 70 %   Neutro Abs 5.7 1.7 - 7.7 K/uL   Lymphocytes Relative 20 %   Lymphs Abs 1.7 0.7 - 4.0 K/uL   Monocytes Relative 8 %   Monocytes Absolute 0.7 0.1 - 1.0 K/uL   Eosinophils Relative 2 %   Eosinophils Absolute 0.1 0.0 - 0.7 K/uL   Basophils Relative 0 %   Basophils Absolute 0.0 0.0 - 0.1 K/uL    Current Facility-Administered Medications  Medication Dose Route Frequency Provider Last Rate Last Dose  . asenapine (SAPHRIS) sublingual tablet 5 mg  5 mg Sublingual BID Milton Ferguson, MD   5 mg at 07/21/16 0915  . benztropine (COGENTIN) tablet 0.5 mg  0.5 mg Oral QPM Milton Ferguson, MD   0.5 mg at 07/20/16 1725  . lamoTRIgine (LAMICTAL) tablet 150 mg  150 mg Oral BID Milton Ferguson, MD   150 mg at 07/21/16 0916  . pantoprazole (PROTONIX) EC tablet 40 mg  40 mg Oral Daily Milton Ferguson, MD   40 mg at 07/21/16 0914  . potassium chloride (K-DUR,KLOR-CON) CR tablet 10 mEq  10 mEq Oral Daily Milton Ferguson, MD   10 mEq at 07/21/16 2993   Current Outpatient Prescriptions  Medication Sig  Dispense Refill  . asenapine (SAPHRIS) 5 MG SUBL 24 hr tablet Place 5 mg under the tongue 2 (two) times daily. Pt takes with a 2.'5mg'$  tablet.    . Asenapine Maleate (SAPHRIS) 2.5 MG SUBL Place 2.5 mg under the tongue 2 (two) times daily. Pt takes with a '5mg'$  tablet.    . benztropine (COGENTIN) 0.5 MG tablet Take 1 tablet (0.5 mg total) by mouth every evening. 30 tablet 0  . haloperidol (HALDOL) 5 MG tablet Take 1 tablet (5 mg total) by mouth 2 (two) times daily. 10 tablet 0  . lamoTRIgine (LAMICTAL) 150 MG tablet Take 150 mg by mouth 2 (two) times daily.    Marland Kitchen LORazepam (ATIVAN) 0.5 MG tablet Take 0.5 mg by mouth 2 (two) times daily.    Marland Kitchen omeprazole (PRILOSEC) 20 MG capsule Take 20 mg by mouth daily before breakfast.     . potassium chloride (K-DUR,KLOR-CON) 10 MEQ tablet Take 10 mEq by mouth daily.       Musculoskeletal: Strength & Muscle Tone: within normal limits Gait & Station: unsteady Patient leans: N/A  Psychiatric Specialty Exam: Physical Exam  Nursing note and vitals reviewed. Respiratory: Effort normal.  Neurological: He is alert.    Review of Systems  Unable to perform ROS: Medical condition    Blood pressure 132/79, pulse 63, temperature 98.9 F (37.2 C), temperature source Oral, resp. rate 18, SpO2 98 %.There is no height or weight on file to calculate BMI.  General Appearance: Casual  Eye Contact:  Minimal  Speech:  Would not speak today  Volume:  Would not speak  Mood:  Calm  Affect:  Constricted  Thought Process:  Disorganized  Orientation:  Other:  person only  Thought Content:  Illogical  Suicidal Thoughts:  No  Homicidal Thoughts:  No  Memory:  Immediate;   Poor Recent;  Poor Remote;   Poor  Judgement:  Poor  Insight:  Lacking  Psychomotor Activity:  Psychomotor Retardation  Concentration:  Concentration: Poor and Attention Span: Poor  Recall:  Poor  Fund of Knowledge:  Poor  Language:  Poor  Akathisia:  No  Handed:  Right  AIMS (if indicated):      Assets:  Social Support  ADL's:  Impaired  Cognition:  Impaired,  Moderate and Severe  Sleep:        Treatment Plan Summary: Discharge back to skilled nursing facility  Continue Saphris 5 mg for Dementia with aggression  Disposition: No evidence of imminent risk to self or others at present.   Patient does not meet criteria for psychiatric inpatient admission.  Earleen Newport, NP 07/21/2016 10:38 AM   Patient seen, case reviewed. Agree with notes and plan

## 2016-07-21 NOTE — ED Notes (Signed)
Pt. Assisted to use bathroom, offered drinks and food but still resistant/ refused for care, v/s taking , meds and lab works. Easily got agitated. To notify MD.

## 2016-07-21 NOTE — Progress Notes (Signed)
CSW [facility] to inform staff that patient is psychiatrically cleared and ready to return to facility. CSW spoke with staff member Vernona RiegerLaura, staff requested CSW contact information and reported that the nurse would contact CSW. CSW awaiting return call from [facility].  CSW filed patient's notice of commitment change paperwork into IVC logbook.   Antonio Montoya, LCSWA [facility]  Clinical Social Worker [phone]

## 2016-07-21 NOTE — ED Notes (Addendum)
Attempt to get vitals, patient refused.

## 2016-07-22 MED ORDER — DIPHENHYDRAMINE HCL 50 MG/ML IJ SOLN
25.0000 mg | Freq: Once | INTRAMUSCULAR | Status: AC
  Administered 2016-07-22: 25 mg via INTRAMUSCULAR
  Filled 2016-07-22: qty 1

## 2016-07-22 MED ORDER — MIDAZOLAM HCL 2 MG/2ML IJ SOLN: 4.0000 mg | Freq: Once | INTRAMUSCULAR | Status: DC

## 2016-07-22 MED ORDER — HALOPERIDOL LACTATE 5 MG/ML IJ SOLN
10.0000 mg | Freq: Once | INTRAMUSCULAR | Status: AC
  Administered 2016-07-22: 10 mg via INTRAMUSCULAR
  Filled 2016-07-22: qty 2

## 2016-07-22 NOTE — BH Assessment (Signed)
[facility] Assessment Progress Note  Pt refused to speak or look at Clinical research associatewriter. Pt was recommended for discharge by Gilmore LarocheAkhtar, MD yesterday. However, facility states pt cannot have a sitter for 24 hours prior to discharge.   Daisy FloroCandace L Heily Carlucci MSW, LCSWA  07/22/2016 10:46 AM

## 2016-07-22 NOTE — Progress Notes (Signed)
Patient started to be become agaiated and walking out of room nude not able follow instruction, MD notified, security  Help patient back in bed, restraints applied and medicated. Will continued to monitor

## 2016-07-22 NOTE — ED Notes (Addendum)
Patient very anxious continuously pacing around room asking to speak to chaplin and MD. Per night shift RN patient has not sleep all night. Chaplin visited with patient  but he is still anxious.

## 2016-07-22 NOTE — ED Provider Notes (Signed)
Called to the patient's bedside as he is becoming increasingly agitated and aggressive. Trying to bite and hit medical professionals. Given Haldol medazolam and Benadryl   Melene PlanFloyd, Abayomi Pattison, DO 07/22/16 [id number]

## 2016-07-23 ENCOUNTER — Emergency Department (HOSPITAL_COMMUNITY): Payer: Medicare Other

## 2016-07-23 DIAGNOSIS — R05 Cough: Secondary | ICD-10-CM | POA: Diagnosis not present

## 2016-07-23 LAB — BASIC METABOLIC PANEL
Anion gap: 5 (ref 5–15)
BUN: 21 mg/dL — ABNORMAL HIGH (ref 6–20)
CALCIUM: 9.3 mg/dL (ref 8.9–10.3)
CO2: 32 mmol/L (ref 22–32)
CREATININE: 1.21 mg/dL (ref 0.61–1.24)
Chloride: 104 mmol/L (ref 101–111)
GFR calc Af Amer: >60 mL/min (ref >60)
GFR calc non Af Amer: >60 mL/min (ref >60)
GLUCOSE: 107 mg/dL — AB (ref 65–99)
Potassium: 3.9 mmol/L (ref 3.5–5.1)
Sodium: 141 mmol/L (ref 135–145)

## 2016-07-23 MED ORDER — HALOPERIDOL LACTATE 5 MG/ML IJ SOLN: 5.0000 mg | Freq: Once | INTRAMUSCULAR | Status: DC

## 2016-07-23 NOTE — Progress Notes (Addendum)
CSW spoke with [name] and Lajoyce CornersIvy, representatives with [name], who asked CSW and RN questions regarding patients current stay. CSW informed Ms. Ivy of new medication changes. RN informed Ms. LajoycVelna Hatchete Cornersvy of time restraints were discontinued which has been more than 24 hours at this time and medications given. Ms. Lajoyce Cornersvy expressed concerns with whether patient had a sitter at the current time due to chair being close to room. CSW informed Ms. Lajoyce Cornersvy that patient did not currently have a Comptrollersitter. Ms. Lajoyce Cornersvy also expressed concerns regarding patients behaviors/aggression- CSW and RN explained patients behaviors throughout the day and what lead up to restraints. [name] and Lajoyce Cornersvy unsure if they will be able to take patient back at this time. CSW will fax requested information to [name] with [name] and will follow up with [name].   2:14PM: CSW contacted [name] with [name] who stated they are currently looking over information sent.   Stacy GardnerErin Nomar Broad, LCSWA Clinical Social Worker [phone]

## 2016-07-23 NOTE — ED Provider Notes (Addendum)
6:14 PM Social worker asked to see if I will evaluate patient to determine if he is agitated or needed further psychiatric evaluation.  When I went to evaluate the patient, he is resting calmly watching television. He is mumbling but does not appear to be a threat to himself or others. Patient denied any new complaints.  Review of the patient's chart shows that the psychiatry team saw him within  the last few hours and continued to feel he did not need further psychiatric management. The review of the chart shows that the patient was medically cleared by previous team.   Given these reassurances and his well appearance on my evaluation, feel patient is stable for discharge as this is the recommendation by the previous medical and psychiatric team.   Toney Lizaola, Canary Brimhristopher J, MD 07/24/16 0106    Clydie Dillen, Canary Brimhristopher J, MD 07/24/16 [phone]

## 2016-07-23 NOTE — Progress Notes (Signed)
CSW attempted to contact [name] with [name] to discuss patients return. CSW left voicemail for return call.   [name], LCSWA Clinical Social Worker [phone]

## 2016-07-23 NOTE — Progress Notes (Addendum)
CSW contacted [name] [facility] for an update on pt's re-admission and was informed pt is "seen as a danger to the residents of memory care unit" and "due to the pt putting the patients in jeopardy" at [facility], per notes, the pt is not going to be allowed to return, per Ivy.  Per Simonne MaffucciSheila, Ivy Pierson is going to "try and figure out what's next by speaking to their legal team" and that [name] is not aware what that is at this time.    Dorothe PeaJonathan F. Nia Nathaniel, Theresia MajorsLCSWA, LCAS Clinical Social Worker Ph: [phone]

## 2016-07-23 NOTE — ED Notes (Signed)
Pt. Refuse his vitals

## 2016-07-23 NOTE — ED Provider Notes (Signed)
Asked to reassess patient prior to possible transfer back to facility. They concern regarding cough. Chest x-ray was completed which showed no evidence of pneumonia. Patient remains hemodynamically stable, is well-appearing on my exam, normal breath sounds bilaterally. Reviewed lab work.   Patient is medically cleared. He has been calm throughout the shift. Awaiting disposition per social work and facility.    Alvira MondaySchlossman, Fard Borunda, MD 07/23/16 [phone]

## 2016-07-23 NOTE — Progress Notes (Signed)
CSW staffed with CSW Asst. Director who requested last Haldol administration if given, amount of Haldol and medications at discharge.  CSW spoke with pt's RN and EDP and per RN pt's last admin if Haldol was a 10mg  IM at 3:30pm on 5/13.    Per EDP pt's only psychiatric medication at D/C will be (if not changed) a 5 mg tablet of Saphris 2 times daily.  Asst Director updated.  Dorothe PeaJonathan F. Amias Hutchinson, Theresia MajorsLCSWA, LCAS Clinical Social Worker Ph: [phone]

## 2016-07-23 NOTE — Progress Notes (Signed)
CSW discussed case with Chiropodistassistant director. AD stated [facility] is responsible for picking patient up at this time. CSW will updated [name] [facility] and update second shift CSW.   Stacy GardnerErin Corben Auzenne, LCSWA Clinical Social Worker [phone]

## 2016-07-23 NOTE — Progress Notes (Signed)
Attempt to get his BMP at 0530 and at this time. Unable to get it. Patient is uncooperative and keep moving is arm.

## 2016-07-23 NOTE — Progress Notes (Signed)
Patient is calm and cooperative this afternoon.  He needs redirection by staff at times but cooperates with no aggression.  His behavior is typical dementia related behaviors.  No safety concerns, nor would he gain additional benefit from a geriatric psychiatric admission.  Stable from a psychiatric perspective as he is at his baseline.  Prior psychiatric consult also cleared him to return, no change is disposition at this time.  Clear to return to his care facility.  Nanine MeansJamison Lord, PMH-NP

## 2016-07-24 MED ORDER — MIDAZOLAM HCL 2 MG/2ML IJ SOLN
4.0000 mg | Freq: Once | INTRAMUSCULAR | Status: DC
  Filled 2016-07-24: qty 4

## 2016-07-24 MED ORDER — HALOPERIDOL LACTATE 5 MG/ML IJ SOLN
5.0000 mg | Freq: Once | INTRAMUSCULAR | Status: AC
  Administered 2016-07-24: 5 mg via INTRAMUSCULAR
  Filled 2016-07-24: qty 1

## 2016-07-24 MED ORDER — LAMOTRIGINE 25 MG PO TABS
150.0000 mg | ORAL_TABLET | Freq: Every day | ORAL | Status: DC
  Administered 2016-07-24 – 2016-07-25 (×2): 150 mg via ORAL
  Filled 2016-07-24 (×2): qty 2

## 2016-07-24 MED ORDER — DIPHENHYDRAMINE HCL 50 MG/ML IJ SOLN
25.0000 mg | Freq: Once | INTRAMUSCULAR | Status: AC
  Administered 2016-07-24: 08:00:00 via INTRAVENOUS
  Filled 2016-07-24: qty 1

## 2016-07-24 MED ORDER — MIDAZOLAM HCL 2 MG/2ML IJ SOLN
4.0000 mg | Freq: Once | INTRAMUSCULAR | Status: AC
  Administered 2016-07-24: 4 mg via INTRAMUSCULAR

## 2016-07-24 MED ORDER — ASENAPINE MALEATE 5 MG SL SUBL
10.0000 mg | SUBLINGUAL_TABLET | Freq: Two times a day (BID) | SUBLINGUAL | Status: DC
  Administered 2016-07-24 – 2016-07-25 (×3): 10 mg via SUBLINGUAL
  Filled 2016-07-24 (×3): qty 2

## 2016-07-24 NOTE — ED Notes (Signed)
Pt refused to have is vitals taking RN notify. I was able to get bp

## 2016-07-24 NOTE — ED Notes (Signed)
Patient calmer now, watching TV.  Able to give patient his medications in pudding.

## 2016-07-24 NOTE — Progress Notes (Signed)
CSW followed up with social work Chiropodistassistant director regarding discharge plans. Patient has to be 24 hours out of restraints before [facility]. CSW will update EDP, RN, and NP.   Stacy GardnerErin Torrey Horseman, LCSWA Clinical Social Worker [phone]

## 2016-07-24 NOTE — ED Notes (Signed)
During shift report patient tried to get out of bed but is extremely unsteady on his feet.  Patient became violent when assisted back to bed.  Security at bedside.  MD notified.  Medications ordered by physician.

## 2016-07-25 DIAGNOSIS — F09 Unspecified mental disorder due to known physiological condition: Secondary | ICD-10-CM | POA: Diagnosis not present

## 2016-07-25 DIAGNOSIS — F79 Unspecified intellectual disabilities: Secondary | ICD-10-CM | POA: Diagnosis not present

## 2016-07-25 DIAGNOSIS — G9340 Encephalopathy, unspecified: Secondary | ICD-10-CM | POA: Diagnosis not present

## 2016-07-25 DIAGNOSIS — I10 Essential (primary) hypertension: Secondary | ICD-10-CM | POA: Diagnosis not present

## 2016-07-25 DIAGNOSIS — R419 Unspecified symptoms and signs involving cognitive functions and awareness: Secondary | ICD-10-CM | POA: Diagnosis not present

## 2016-07-25 DIAGNOSIS — G259 Extrapyramidal and movement disorder, unspecified: Secondary | ICD-10-CM | POA: Diagnosis not present

## 2016-07-25 DIAGNOSIS — S0990XA Unspecified injury of head, initial encounter: Secondary | ICD-10-CM | POA: Diagnosis not present

## 2016-07-25 DIAGNOSIS — M6281 Muscle weakness (generalized): Secondary | ICD-10-CM | POA: Diagnosis not present

## 2016-07-25 DIAGNOSIS — F39 Unspecified mood [affective] disorder: Secondary | ICD-10-CM | POA: Diagnosis not present

## 2016-07-25 DIAGNOSIS — R4182 Altered mental status, unspecified: Secondary | ICD-10-CM | POA: Diagnosis present

## 2016-07-25 DIAGNOSIS — R41841 Cognitive communication deficit: Secondary | ICD-10-CM | POA: Diagnosis not present

## 2016-07-25 DIAGNOSIS — F918 Other conduct disorders: Secondary | ICD-10-CM | POA: Diagnosis not present

## 2016-07-25 DIAGNOSIS — F0391 Unspecified dementia with behavioral disturbance: Secondary | ICD-10-CM | POA: Diagnosis not present

## 2016-07-25 DIAGNOSIS — F333 Major depressive disorder, recurrent, severe with psychotic symptoms: Secondary | ICD-10-CM | POA: Diagnosis not present

## 2016-07-25 DIAGNOSIS — Z79899 Other long term (current) drug therapy: Secondary | ICD-10-CM | POA: Diagnosis not present

## 2016-07-25 DIAGNOSIS — F919 Conduct disorder, unspecified: Secondary | ICD-10-CM | POA: Diagnosis not present

## 2016-07-25 DIAGNOSIS — M4322 Fusion of spine, cervical region: Secondary | ICD-10-CM | POA: Diagnosis not present

## 2016-07-25 DIAGNOSIS — R410 Disorientation, unspecified: Secondary | ICD-10-CM | POA: Diagnosis not present

## 2016-07-25 DIAGNOSIS — R4589 Other symptoms and signs involving emotional state: Secondary | ICD-10-CM | POA: Diagnosis not present

## 2016-07-25 DIAGNOSIS — F201 Disorganized schizophrenia: Secondary | ICD-10-CM | POA: Diagnosis not present

## 2016-07-25 DIAGNOSIS — R456 Violent behavior: Secondary | ICD-10-CM | POA: Diagnosis not present

## 2016-07-25 DIAGNOSIS — F209 Schizophrenia, unspecified: Secondary | ICD-10-CM | POA: Diagnosis not present

## 2016-07-25 DIAGNOSIS — R1319 Other dysphagia: Secondary | ICD-10-CM | POA: Diagnosis not present

## 2016-07-25 DIAGNOSIS — F0390 Unspecified dementia without behavioral disturbance: Secondary | ICD-10-CM | POA: Diagnosis not present

## 2016-07-25 DIAGNOSIS — R1310 Dysphagia, unspecified: Secondary | ICD-10-CM | POA: Diagnosis not present

## 2016-07-25 MED ORDER — ASENAPINE MALEATE 5 MG SL SUBL: 10.0000 mg | SUBLINGUAL_TABLET | Freq: Two times a day (BID) | SUBLINGUAL | 0 refills | Status: AC

## 2016-07-25 MED ORDER — LAMOTRIGINE 150 MG PO TABS: 150.0000 mg | ORAL_TABLET | Freq: Every day | ORAL | 0 refills | Status: DC

## 2016-07-25 NOTE — Progress Notes (Signed)
CSW received call back from GrampianSheila with [facility] stating patient is able to return after 3PM today. CSW will schedule [facility] transportation for 3PM today 5/16.  Stacy GardnerErin Jaskiran Pata, LCSWA Clinical Social Worker [phone]

## 2016-07-25 NOTE — ED Notes (Addendum)
Pt would not keep pulse ox on his finger for DC VS.

## 2016-07-25 NOTE — ED Notes (Signed)
Pt has remained free of restraints since 1900; mittens remain on hands but not secured to bed.

## 2016-07-25 NOTE — Progress Notes (Signed)
CSW contacted [facility] for pickup.   Stacy GardnerErin Mileena Rothenberger, LCSWA Clinical Social Worker [phone]

## 2016-07-25 NOTE — ED Notes (Signed)
Pt is very pleasant at this time.  Watching Seinfeld, smiling at this Clinical research associatewriter.  Pt took his medications in apple sauce with no trouble.  Pt is NOT restrained.

## 2016-07-25 NOTE — ED Notes (Signed)
D/C instructions given to [facility].  

## 2016-07-25 NOTE — Progress Notes (Signed)
CSW will contact [facility] this AM due to [name] being restraint free since yesterday 5/15 morning. CSW will continue to follow up.   Stacy GardnerErin Dhanya Bogle, LCSWA Clinical Social Worker [phone]

## 2016-07-25 NOTE — ED Notes (Signed)
Pt let me take BP, but refused other vitals.

## 2016-07-25 NOTE — Progress Notes (Signed)
Patient seen today.  Appears calm, free of restraints.  Per nursing, patient is compliant with taking medication.  He is not exhibiting disruptive behaviors.   Medication regimen adjusted, Saphris adjusted from 5 mg BID to 10 mg BID.  Patient is tolerating the med change.    Patient seen face-to-face for psychiatric evaluation, chart reviewed and case discussed with the physician extender and developed treatment plan. Reviewed the information documented and agree with the treatment plan. Antonio MinsMojeed Olinda Nola, MD

## 2016-08-01 DIAGNOSIS — F209 Schizophrenia, unspecified: Secondary | ICD-10-CM | POA: Diagnosis not present

## 2016-08-01 DIAGNOSIS — R1310 Dysphagia, unspecified: Secondary | ICD-10-CM | POA: Diagnosis not present

## 2016-08-02 DIAGNOSIS — F79 Unspecified intellectual disabilities: Secondary | ICD-10-CM | POA: Diagnosis not present

## 2016-08-02 DIAGNOSIS — F201 Disorganized schizophrenia: Secondary | ICD-10-CM | POA: Diagnosis not present

## 2016-08-02 DIAGNOSIS — G259 Extrapyramidal and movement disorder, unspecified: Secondary | ICD-10-CM | POA: Diagnosis not present

## 2016-08-02 DIAGNOSIS — F39 Unspecified mood [affective] disorder: Secondary | ICD-10-CM | POA: Diagnosis not present

## 2016-08-10 DIAGNOSIS — S0990XA Unspecified injury of head, initial encounter: Secondary | ICD-10-CM | POA: Diagnosis not present

## 2016-08-10 DIAGNOSIS — F79 Unspecified intellectual disabilities: Secondary | ICD-10-CM | POA: Diagnosis not present

## 2016-08-10 DIAGNOSIS — F919 Conduct disorder, unspecified: Secondary | ICD-10-CM | POA: Diagnosis not present

## 2016-08-10 DIAGNOSIS — Z79899 Other long term (current) drug therapy: Secondary | ICD-10-CM | POA: Diagnosis not present

## 2016-08-10 DIAGNOSIS — R1319 Other dysphagia: Secondary | ICD-10-CM | POA: Diagnosis not present

## 2016-08-10 DIAGNOSIS — F0391 Unspecified dementia with behavioral disturbance: Secondary | ICD-10-CM | POA: Diagnosis not present

## 2016-08-10 DIAGNOSIS — R4182 Altered mental status, unspecified: Secondary | ICD-10-CM | POA: Diagnosis present

## 2016-08-10 DIAGNOSIS — R456 Violent behavior: Secondary | ICD-10-CM | POA: Diagnosis not present

## 2016-08-10 DIAGNOSIS — F09 Unspecified mental disorder due to known physiological condition: Secondary | ICD-10-CM | POA: Diagnosis not present

## 2016-08-10 DIAGNOSIS — R41841 Cognitive communication deficit: Secondary | ICD-10-CM | POA: Diagnosis not present

## 2016-08-10 DIAGNOSIS — G9340 Encephalopathy, unspecified: Secondary | ICD-10-CM | POA: Diagnosis not present

## 2016-08-10 DIAGNOSIS — R419 Unspecified symptoms and signs involving cognitive functions and awareness: Secondary | ICD-10-CM | POA: Diagnosis not present

## 2016-08-10 DIAGNOSIS — R4589 Other symptoms and signs involving emotional state: Secondary | ICD-10-CM | POA: Diagnosis not present

## 2016-08-10 DIAGNOSIS — F333 Major depressive disorder, recurrent, severe with psychotic symptoms: Secondary | ICD-10-CM | POA: Diagnosis not present

## 2016-08-10 DIAGNOSIS — I10 Essential (primary) hypertension: Secondary | ICD-10-CM | POA: Diagnosis not present

## 2016-08-10 DIAGNOSIS — M4322 Fusion of spine, cervical region: Secondary | ICD-10-CM | POA: Diagnosis not present

## 2016-08-10 DIAGNOSIS — M6281 Muscle weakness (generalized): Secondary | ICD-10-CM | POA: Diagnosis not present

## 2016-08-10 DIAGNOSIS — F918 Other conduct disorders: Secondary | ICD-10-CM | POA: Diagnosis not present

## 2016-08-23 ENCOUNTER — Emergency Department (HOSPITAL_COMMUNITY)
Admission: EM | Admit: 2016-08-23 | Discharge: 2016-09-17 | Disposition: A | Discharge status: Home / Self Care | Payer: Medicare Other | Attending: Emergency Medicine | Admitting: Emergency Medicine

## 2016-08-23 ENCOUNTER — Encounter (HOSPITAL_COMMUNITY): Payer: Self-pay | Admitting: *Deleted

## 2016-08-23 ENCOUNTER — Emergency Department (HOSPITAL_COMMUNITY): Payer: Medicare Other

## 2016-08-23 DIAGNOSIS — Z79899 Other long term (current) drug therapy: Secondary | ICD-10-CM | POA: Insufficient documentation

## 2016-08-23 DIAGNOSIS — R4182 Altered mental status, unspecified: Secondary | ICD-10-CM | POA: Diagnosis not present

## 2016-08-23 DIAGNOSIS — R4589 Other symptoms and signs involving emotional state: Secondary | ICD-10-CM | POA: Diagnosis not present

## 2016-08-23 DIAGNOSIS — F209 Schizophrenia, unspecified: Secondary | ICD-10-CM | POA: Diagnosis not present

## 2016-08-23 DIAGNOSIS — F79 Unspecified intellectual disabilities: Secondary | ICD-10-CM | POA: Insufficient documentation

## 2016-08-23 DIAGNOSIS — F419 Anxiety disorder, unspecified: Secondary | ICD-10-CM | POA: Diagnosis not present

## 2016-08-23 DIAGNOSIS — F03918 Unspecified dementia, unspecified severity, with other behavioral disturbance: Secondary | ICD-10-CM | POA: Diagnosis present

## 2016-08-23 DIAGNOSIS — F918 Other conduct disorders: Secondary | ICD-10-CM | POA: Insufficient documentation

## 2016-08-23 DIAGNOSIS — R4689 Other symptoms and signs involving appearance and behavior: Secondary | ICD-10-CM

## 2016-08-23 DIAGNOSIS — Z049 Encounter for examination and observation for unspecified reason: Secondary | ICD-10-CM

## 2016-08-23 DIAGNOSIS — S0990XA Unspecified injury of head, initial encounter: Secondary | ICD-10-CM | POA: Diagnosis not present

## 2016-08-23 DIAGNOSIS — M4322 Fusion of spine, cervical region: Secondary | ICD-10-CM | POA: Diagnosis not present

## 2016-08-23 DIAGNOSIS — F919 Conduct disorder, unspecified: Secondary | ICD-10-CM | POA: Diagnosis not present

## 2016-08-23 DIAGNOSIS — F0391 Unspecified dementia with behavioral disturbance: Secondary | ICD-10-CM | POA: Insufficient documentation

## 2016-08-23 LAB — CBC WITH DIFFERENTIAL/PLATELET
BASOS ABS: 0 10*3/uL (ref 0.0–0.1)
BASOS PCT: 0 %
Eosinophils Absolute: 0.1 10*3/uL (ref 0.0–0.7)
Eosinophils Relative: 1 %
HCT: 37.8 % — ABNORMAL LOW (ref 39.0–52.0)
HEMOGLOBIN: 12.6 g/dL — AB (ref 13.0–17.0)
LYMPHS PCT: 23 %
Lymphs Abs: 1.7 10*3/uL (ref 0.7–4.0)
MCH: 31.7 pg (ref 26.0–34.0)
MCHC: 33.3 g/dL (ref 30.0–36.0)
MCV: 95 fL (ref 78.0–100.0)
MONO ABS: 0.5 10*3/uL (ref 0.1–1.0)
Monocytes Relative: 6 %
NEUTROS ABS: 5 10*3/uL (ref 1.7–7.7)
NEUTROS PCT: 70 %
Platelets: 188 10*3/uL (ref 150–400)
RBC: 3.98 MIL/uL — AB (ref 4.22–5.81)
RDW: 13 % (ref 11.5–15.5)
WBC: 7.3 10*3/uL (ref 4.0–10.5)

## 2016-08-23 LAB — COMPREHENSIVE METABOLIC PANEL
ALBUMIN: 3.6 g/dL (ref 3.5–5.0)
ALT: 11 U/L — AB (ref 17–63)
AST: 23 U/L (ref 15–41)
Alkaline Phosphatase: 77 U/L (ref 38–126)
Anion gap: 8 (ref 5–15)
BILIRUBIN TOTAL: 0.5 mg/dL (ref 0.3–1.2)
BUN: 20 mg/dL (ref 6–20)
CO2: 29 mmol/L (ref 22–32)
CREATININE: 1.02 mg/dL (ref 0.61–1.24)
Calcium: 9.3 mg/dL (ref 8.9–10.3)
Chloride: 106 mmol/L (ref 101–111)
GFR calc Af Amer: >60 mL/min (ref >60)
GLUCOSE: 83 mg/dL (ref 65–99)
POTASSIUM: 4.1 mmol/L (ref 3.5–5.1)
Sodium: 143 mmol/L (ref 135–145)
TOTAL PROTEIN: 6.8 g/dL (ref 6.5–8.1)

## 2016-08-23 LAB — ETHANOL: Alcohol, Ethyl (B): <5 mg/dL (ref <5)

## 2016-08-23 MED ORDER — BENZTROPINE MESYLATE 0.5 MG PO TABS
0.5000 mg | ORAL_TABLET | Freq: Every evening | ORAL | Status: DC
  Filled 2016-08-23: qty 1

## 2016-08-23 MED ORDER — ASENAPINE MALEATE 5 MG SL SUBL
10.0000 mg | SUBLINGUAL_TABLET | Freq: Two times a day (BID) | SUBLINGUAL | Status: DC
  Administered 2016-08-24 – 2016-09-08 (×27): 10 mg via SUBLINGUAL
  Administered 2016-09-08: 5 mg via SUBLINGUAL
  Administered 2016-09-09 – 2016-09-17 (×16): 10 mg via SUBLINGUAL
  Filled 2016-08-23 (×47): qty 2

## 2016-08-23 MED ORDER — LORAZEPAM 2 MG/ML IJ SOLN
2.0000 mg | Freq: Once | INTRAMUSCULAR | Status: AC
  Administered 2016-08-23: 2 mg via INTRAMUSCULAR
  Filled 2016-08-23: qty 1

## 2016-08-23 MED ORDER — HALOPERIDOL LACTATE 5 MG/ML IJ SOLN
5.0000 mg | Freq: Once | INTRAMUSCULAR | Status: AC
  Administered 2016-08-23: 5 mg via INTRAMUSCULAR
  Filled 2016-08-23: qty 1

## 2016-08-23 MED ORDER — HALOPERIDOL 5 MG PO TABS
5.0000 mg | ORAL_TABLET | Freq: Two times a day (BID) | ORAL | Status: DC
  Administered 2016-08-24: 5 mg via ORAL
  Filled 2016-08-23: qty 1

## 2016-08-23 MED ORDER — PANTOPRAZOLE SODIUM 40 MG PO TBEC
40.0000 mg | DELAYED_RELEASE_TABLET | Freq: Every day | ORAL | Status: DC
  Administered 2016-08-24 – 2016-09-17 (×24): 40 mg via ORAL
  Filled 2016-08-23 (×25): qty 1

## 2016-08-23 MED ORDER — POTASSIUM CHLORIDE CRYS ER 10 MEQ PO TBCR
10.0000 meq | EXTENDED_RELEASE_TABLET | Freq: Every day | ORAL | Status: DC
  Administered 2016-08-24 – 2016-09-17 (×25): 10 meq via ORAL
  Filled 2016-08-23 (×26): qty 1

## 2016-08-23 MED ORDER — LAMOTRIGINE 25 MG PO TABS
150.0000 mg | ORAL_TABLET | Freq: Every day | ORAL | Status: DC
  Administered 2016-08-24 – 2016-08-27 (×4): 150 mg via ORAL
  Filled 2016-08-23 (×5): qty 2

## 2016-08-23 NOTE — ED Notes (Signed)
Unable to obtain blood due to patient aggressiveness.  PA-C Notified

## 2016-08-23 NOTE — ED Notes (Signed)
Bed: [id number] Expected date:  Expected time:  Means of arrival:  Comments: 66 yo m IVC, confused
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
## 2016-08-23 NOTE — ED Notes (Signed)
Accidentally clicked off CT Head
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
## 2016-08-23 NOTE — ED Notes (Signed)
Bed: [id number] Expected date:  Expected time:  Means of arrival:  Comments: Augustus, RM 11

## 2016-08-24 DIAGNOSIS — F79 Unspecified intellectual disabilities: Secondary | ICD-10-CM

## 2016-08-24 DIAGNOSIS — F209 Schizophrenia, unspecified: Secondary | ICD-10-CM

## 2016-08-24 DIAGNOSIS — F0391 Unspecified dementia with behavioral disturbance: Secondary | ICD-10-CM

## 2016-08-24 DIAGNOSIS — Z79899 Other long term (current) drug therapy: Secondary | ICD-10-CM

## 2016-08-24 MED ORDER — STERILE WATER FOR INJECTION IJ SOLN
INTRAMUSCULAR | Status: AC
  Administered 2016-08-24: 1 mL
  Filled 2016-08-24: qty 10

## 2016-08-24 MED ORDER — HALOPERIDOL LACTATE 5 MG/ML IJ SOLN
INTRAMUSCULAR | Status: AC
  Administered 2016-08-24: 5 mg
  Filled 2016-08-24: qty 1

## 2016-08-24 MED ORDER — ZIPRASIDONE MESYLATE 20 MG IM SOLR
20.0000 mg | Freq: Once | INTRAMUSCULAR | Status: AC
  Administered 2016-08-24: 20 mg via INTRAMUSCULAR
  Filled 2016-08-24: qty 20

## 2016-08-24 MED ORDER — HYDROXYZINE HCL 25 MG PO TABS
25.0000 mg | ORAL_TABLET | Freq: Two times a day (BID) | ORAL | Status: DC
  Administered 2016-08-25 – 2016-08-27 (×6): 25 mg via ORAL
  Filled 2016-08-24 (×7): qty 1

## 2016-08-24 MED ORDER — LORAZEPAM 2 MG/ML IJ SOLN
1.0000 mg | Freq: Once | INTRAMUSCULAR | Status: AC
  Administered 2016-08-24: 1 mg via INTRAMUSCULAR
  Filled 2016-08-24: qty 1

## 2016-08-24 NOTE — ED Notes (Signed)
Pt. Had received a breakfast tray. Pt. Ate 100% and drunk about 480 ml. Nurse aware.
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
## 2016-08-24 NOTE — BH Assessment (Signed)
[facility] Assessment Progress Note  Per Thedore MinsMojeed Akintayo, MD, pt meets criteria for IVC at this time.  Pt presents under IVC initiated by Rochele PagesSusan Brand, [facility], which Dr Jannifer FranklinAkintayo has upheld.  At [facility] time [facility].  Final disposition will be determined tomorrow, 08/25/2016.  In case it is determined that pt requires psychiatric hospitalization, this writer has collected the following documents: *IVC documents *Letter of guardianship awarding custody of pt to his sister, Antonio Montoya (cell: [phone]; home: [phone]) *Psychometric testing, including pt's IQ These documents have been faxed to [facility] via [employer], with a request that they be securely retained in [employer] over the weekend or until pt is ready for discharge, which ever comes first.  They will thus be available for any clinician working on pt's final disposition.  At 12:30 I called Antonio Montoya on her cell phone and left a HIPAA compliant voice mail notifying her of pt's current disposition, along with my call back number and the social worker's phone number [phone]).  Antonio Canninghomas Sayid Moll, MA Triage Specialist [phone]

## 2016-08-24 NOTE — ED Notes (Signed)
Crushed medications, took medications without difficulty.

## 2016-08-24 NOTE — ED Notes (Signed)
Pt made movements towards bathroom. Pt assisted to bathroom by RN and PCT. Pt did not use the toilet and was escorted back to bed. Pt is resting comfortably in no apparent distress at this time.

## 2016-08-24 NOTE — ED Notes (Signed)
Bed: WA20 Expected date:  Expected time:  Means of arrival:  Comments: TCU 28 
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
## 2016-08-24 NOTE — ED Notes (Signed)
Unable to obtain urine sample 

## 2016-08-24 NOTE — ED Notes (Signed)
Bilat soft wrist restrains in place but not applied to bed

## 2016-08-25 DIAGNOSIS — Z79899 Other long term (current) drug therapy: Secondary | ICD-10-CM | POA: Diagnosis not present

## 2016-08-25 DIAGNOSIS — F209 Schizophrenia, unspecified: Secondary | ICD-10-CM | POA: Diagnosis not present

## 2016-08-25 DIAGNOSIS — F0391 Unspecified dementia with behavioral disturbance: Secondary | ICD-10-CM | POA: Diagnosis not present

## 2016-08-25 DIAGNOSIS — F419 Anxiety disorder, unspecified: Secondary | ICD-10-CM | POA: Diagnosis not present

## 2016-08-25 DIAGNOSIS — F79 Unspecified intellectual disabilities: Secondary | ICD-10-CM | POA: Diagnosis not present

## 2016-08-25 MED ORDER — ZIPRASIDONE MESYLATE 20 MG IM SOLR
INTRAMUSCULAR | Status: AC
  Administered 2016-08-25: 20 mg
  Filled 2016-08-25: qty 20

## 2016-08-25 MED ORDER — LAMOTRIGINE 150 MG PO TABS: 150.0000 mg | ORAL_TABLET | Freq: Every day | ORAL | 0 refills | Status: DC

## 2016-08-25 MED ORDER — STERILE WATER FOR INJECTION IJ SOLN
INTRAMUSCULAR | Status: AC
  Administered 2016-08-25: 23:00:00
  Filled 2016-08-25: qty 10

## 2016-08-25 MED ORDER — OLANZAPINE 10 MG PO TBDP: 10.0000 mg | ORAL_TABLET | Freq: Every day | ORAL | 0 refills | Status: DC

## 2016-08-25 MED ORDER — HYDROXYZINE HCL 25 MG PO TABS: 25.0000 mg | ORAL_TABLET | Freq: Two times a day (BID) | ORAL | 0 refills | Status: DC

## 2016-08-25 MED ORDER — ZIPRASIDONE MESYLATE 20 MG IM SOLR: 20.0000 mg | Freq: Once | INTRAMUSCULAR | Status: DC

## 2016-08-25 MED ORDER — OLANZAPINE 10 MG PO TBDP
10.0000 mg | ORAL_TABLET | Freq: Every day | ORAL | Status: DC
  Administered 2016-08-25 – 2016-08-26 (×3): 10 mg via ORAL
  Filled 2016-08-25 (×3): qty 1

## 2016-08-25 MED ORDER — ASENAPINE MALEATE 5 MG SL SUBL: 10.0000 mg | SUBLINGUAL_TABLET | Freq: Two times a day (BID) | SUBLINGUAL | 0 refills | Status: AC

## 2016-08-25 NOTE — ED Notes (Addendum)
Spoke to [employer] social worker and will call me back regarding pt placement.
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
## 2016-08-25 NOTE — ED Notes (Signed)
Patient will not allow vitals to be taken, but he is awake, alert, and respirations are 18 breaths per minute.

## 2016-08-25 NOTE — ED Notes (Signed)
Bed: WA26 Expected date:  Expected time:  Means of arrival:  Comments: 

## 2016-08-25 NOTE — [facility] Suicide Risk Assessment (Signed)
Suicide Risk Assessment  Discharge Assessment   [facility]   Principal Problem: Dementia with behavioral disturbance Discharge Diagnoses:  Patient Active Problem List   Diagnosis Date Noted  . Dementia with behavioral disturbance [F03.91] 07/20/2016    Priority: High  . Aggressive behavior [R45.89]   . Staphylococcus epidermidis bacteremia [R78.81] 06/04/2016  . Urinary tract infection without hematuria [N39.0]   . MR (mental retardation) [F79] 05/30/2016  . Dysphagia [R13.10] 05/30/2016  . Altered mental status [R41.82] 05/28/2016  . Urinary retention [R33.9] 05/04/2016  . Thrombocytopenia (HCC) [D69.6] 05/04/2016  . Pressure injury of skin [L89.90] 05/02/2016  . Hypothermia [T68.XXXA] 05/02/2016  . Elevated LFTs [R79.89]   . Acute delirium [R41.0] 05/01/2016    Total Time spent with patient: 30 minutes  Musculoskeletal: Strength & Muscle Tone: within normal limits Gait & Station: unsteady Patient leans: N/A  Psychiatric Specialty Exam: Physical Exam  Constitutional: He appears well-developed and well-nourished.  HENT:  Head: Normocephalic.  Neck: Normal range of motion.  Respiratory: Effort normal.  Musculoskeletal: Normal range of motion.  Neurological: He is alert.  Psychiatric: His behavior is normal. Thought content normal. His affect is blunt. His speech is delayed. Cognition and memory are impaired. He expresses impulsivity.    Review of Systems  Constitutional: Negative.   HENT: Negative.   Eyes: Negative.   Respiratory: Negative.   Cardiovascular: Negative.   Musculoskeletal: Negative.   Skin: Negative.   Endo/Heme/Allergies: Negative.   Psychiatric/Behavioral: Positive for memory loss.    Blood pressure (!) 115/97, pulse (!) 106, temperature 97.6 F (36.4 C), temperature source Axillary, resp. rate 20, weight 65.8 kg (145 lb), SpO2 99 %.Body mass index is 19.67 kg/m.  General Appearance: Casual  Eye Contact:  Fair   Speech:  Slow  Volume:  Normal  Mood:  Euthymic  Affect:  Blunt  Thought Process:  Less disorganized  Orientation:  Other:  only to person  Thought Content:  Logical at times  Suicidal Thoughts:  NOne  Homicidal Thoughts:  None  Memory:  Immediate;   Fair Recent;   Poor Remote;   Poor  Judgement:  Impaired  Insight:  Lacking  Psychomotor Activity:  Normal  Concentration:  Concentration: Poor and Attention Span: Poor  Recall:  Poor  Fund of Knowledge:  Poor  Language:  Good  Akathisia:  No  Handed:  Right  AIMS (if indicated):     Assets:  Social Support  ADL's:  Impaired  Cognition:  Impaired,  Moderate  Sleep:   fair   Mental Status Per Nursing Assessment::   On Admission:   agitation   Demographic Factors:  Male  Loss Factors: NA  Historical Factors: Impulsivity  Risk Reduction Factors:   Living with another person, especially a relative and Positive social support  Continued Clinical Symptoms:  None  Cognitive Features That Contribute To Risk:  None    Suicide Risk:  Minimal: No identifiable suicidal ideation.  Patients presenting with no risk factors but with morbid ruminations; may be classified as minimal risk based on the severity of the depressive symptoms    Plan Of Care/Follow-up recommendations:  Activity:  as tolerated Diet:  heart healthy diet  Aianna Fahs, NP 08/25/2016, 9:15 AM

## 2016-08-25 NOTE — Progress Notes (Signed)
Referral received for placement back to SNF. CSW following for SNF placement. Isidoro DonningAlesia Kyran Whittier RN CCM Case Mgmt phone [phone]

## 2016-08-25 NOTE — Consult Note (Signed)
[facility] Face-to-Face Psychiatry Consult   Reason for Consult: psychiatric evaluation Referring Physician:  EDP Patient Identification: Antonio Montoya MRN:  [id number] Principal Diagnosis: Dementia with behavioral disturbance Diagnosis:   Patient Active Problem List   Diagnosis Date Noted  . Dementia with behavioral disturbance [F03.91] 07/20/2016    Priority: High  . Aggressive behavior [R45.89]   . Staphylococcus epidermidis bacteremia [R78.81] 06/04/2016  . Urinary tract infection without hematuria [N39.0]   . MR (mental retardation) [F79] 05/30/2016  . Dysphagia [R13.10] 05/30/2016  . Altered mental status [R41.82] 05/28/2016  . Urinary retention [R33.9] 05/04/2016  . Thrombocytopenia ([place]) [D69.6] 05/04/2016  . Pressure injury of skin [L89.90] 05/02/2016  . Hypothermia [T68.XXXA] 05/02/2016  . Elevated LFTs [R79.89]   . Acute delirium [R41.0] 05/01/2016    Total Time spent with patient: 30 minutes  Subjective:   Antonio Montoya is a 66 y.o. male patient has stabilized.  HPI:  Patient with history of Intellectual disability, Dementia and Schizophrenia. He was brought to [facility] by his care giver from assisted living facility, memory unit due to severe agitation and combative behavior. Patient was started on medications yesterday and he still is impulsive at times but not combative.  His impulsivity is complicated by his dementia, psych diagnosis, and MR status.  Not combative, patient is at his baseline and can return to his facility  Past Psychiatric History: as above  Risk to Self: NOne Risk to Others: None Prior Inpatient Therapy: Prior Inpatient Therapy:  (UTA) Prior Therapy Dates: UTA Prior Therapy Facilty/Provider(s): UTA Reason for Treatment: UTA Prior Outpatient Therapy: Prior Outpatient Therapy:  (UTA) Prior Therapy Dates: UTA Prior Therapy Facilty/Provider(s): UTA Reason for Treatment: UTA Does patient have an ACCT team?:  (UTA) Does patient have Intensive In-House  Services?  :  (UTA) Does patient have [facility] services? :  Pincus Badder) Does patient have P4CC services?:  (UTA)  Past Medical History:  Past Medical History:  Diagnosis Date  . MR (mental retardation)   . Schizophrenia [facility])     Past Surgical History:  Procedure Laterality Date  . CYSTOSCOPY N/A 05/18/2016   Procedure: CYSTOSCOPY;  Surgeon: Kathie Rhodes, MD;  Location: [facility];  Service: Urology;  Laterality: N/A;  . TRANSURETHRAL RESECTION OF PROSTATE N/A 05/18/2016   Procedure: TRANSURETHRAL RESECTION OF THE PROSTATE (TURP);  Surgeon: Kathie Rhodes, MD;  Location: [facility];  Service: Urology;  Laterality: N/A;   Family History:  Family History  Problem Relation Age of Onset  . Family history unknown: Yes   Family Psychiatric  History: unknown Social History:  History  Alcohol Use No     History  Drug Use No    Social History   Social History  . Marital status: Single    Spouse name: N/A  . Number of children: N/A  . Years of education: N/A   Social History Main Topics  . Smoking status: Never Smoker  . Smokeless tobacco: Never Used  . Alcohol use No  . Drug use: No  . Sexual activity: No   Other Topics Concern  . None   Social History Narrative  . None   Additional Social History:    Allergies:  No Known Allergies  Labs:  Results for orders placed or performed during the hospital encounter of 08/23/16 (from the past 48 hour(s))  CBC with Differential     Status: Abnormal   Collection Time: 08/23/16  2:39 PM  Result Value Ref Range   WBC 7.3 4.0 - 10.5 K/uL   RBC  3.98 (L) 4.22 - 5.81 MIL/uL   Hemoglobin 12.6 (L) 13.0 - 17.0 g/dL   HCT 37.8 (L) 39.0 - 52.0 %   MCV 95.0 78.0 - 100.0 fL   MCH 31.7 26.0 - 34.0 pg   MCHC 33.3 30.0 - 36.0 g/dL   RDW 13.0 11.5 - 15.5 %   Platelets 188 150 - 400 K/uL   Neutrophils Relative % 70 %   Neutro Abs 5.0 1.7 - 7.7 K/uL   Lymphocytes Relative 23 %   Lymphs Abs 1.7 0.7 - 4.0 K/uL   Monocytes Relative 6 %   Monocytes  Absolute 0.5 0.1 - 1.0 K/uL   Eosinophils Relative 1 %   Eosinophils Absolute 0.1 0.0 - 0.7 K/uL   Basophils Relative 0 %   Basophils Absolute 0.0 0.0 - 0.1 K/uL  Comprehensive metabolic panel     Status: Abnormal   Collection Time: 08/23/16  2:39 PM  Result Value Ref Range   Sodium 143 135 - 145 mmol/L   Potassium 4.1 3.5 - 5.1 mmol/L   Chloride 106 101 - 111 mmol/L   CO2 29 22 - 32 mmol/L   Glucose, Bld 83 65 - 99 mg/dL   BUN 20 6 - 20 mg/dL   Creatinine, Ser 1.02 0.61 - 1.24 mg/dL   Calcium 9.3 8.9 - 10.3 mg/dL   Total Protein 6.8 6.5 - 8.1 g/dL   Albumin 3.6 3.5 - 5.0 g/dL   AST 23 15 - 41 U/L   ALT 11 (L) 17 - 63 U/L   Alkaline Phosphatase 77 38 - 126 U/L   Total Bilirubin 0.5 0.3 - 1.2 mg/dL   GFR calc non Af Amer >60 >60 mL/min   GFR calc Af Amer >60 >60 mL/min    Comment: (NOTE) The eGFR has been calculated using the CKD EPI equation. This calculation has not been validated in all clinical situations. eGFR's persistently <60 mL/min signify possible Chronic Kidney Disease.    Anion gap 8 5 - 15  Ethanol     Status: None   Collection Time: 08/23/16  2:39 PM  Result Value Ref Range   Alcohol, Ethyl (B) <5 <5 mg/dL    Comment:        LOWEST DETECTABLE LIMIT FOR SERUM ALCOHOL IS 5 mg/dL FOR MEDICAL PURPOSES ONLY     Current Facility-Administered Medications  Medication Dose Route Frequency Provider Last Rate Last Dose  . asenapine (SAPHRIS) sublingual tablet 10 mg  10 mg Sublingual BID Kirichenko, Tatyana, PA-C   10 mg at 08/25/16 0201  . hydrOXYzine (ATARAX/VISTARIL) tablet 25 mg  25 mg Oral BID Corena Pilgrim, MD   25 mg at 08/25/16 0202  . lamoTRIgine (LAMICTAL) tablet 150 mg  150 mg Oral Daily Stefano Trulson, MD   150 mg at 08/24/16 0952  . OLANZapine zydis (ZYPREXA) disintegrating tablet 10 mg  10 mg Oral QHS Deno Etienne, DO   10 mg at 08/25/16 0202  . pantoprazole (PROTONIX) EC tablet 40 mg  40 mg Oral Daily Kirichenko, Tatyana, PA-C   40 mg at 08/24/16  5176  . potassium chloride (K-DUR,KLOR-CON) CR tablet 10 mEq  10 mEq Oral Daily Jeannett Senior, PA-C   10 mEq at 08/24/16 1607   Current Outpatient Prescriptions  Medication Sig Dispense Refill  . asenapine (SAPHRIS) 5 MG SUBL 24 hr tablet Place 2 tablets (10 mg total) under the tongue 2 (two) times daily. 60 tablet 0  . benztropine (COGENTIN) 0.5 MG tablet Take 1 tablet (0.5  mg total) by mouth every evening. 30 tablet 0  . haloperidol (HALDOL) 5 MG tablet Take 1 tablet (5 mg total) by mouth 2 (two) times daily. 10 tablet 0  . lamoTRIgine (LAMICTAL) 150 MG tablet Take 1 tablet (150 mg total) by mouth daily. 30 tablet 0  . pantoprazole (PROTONIX) 40 MG tablet Take 1 tablet (40 mg total) by mouth daily. 30 tablet 0  . potassium chloride (K-DUR,KLOR-CON) 10 MEQ tablet Take 10 mEq by mouth daily.       Musculoskeletal: Strength & Muscle Tone: within normal limits Gait & Station: unsteady Patient leans: N/A  Psychiatric Specialty Exam: Physical Exam  Constitutional: He appears well-developed and well-nourished.  HENT:  Head: Normocephalic.  Neck: Normal range of motion.  Respiratory: Effort normal.  Musculoskeletal: Normal range of motion.  Neurological: He is alert.  Psychiatric: His behavior is normal. Thought content normal. His affect is blunt. His speech is delayed. Cognition and memory are impaired. He expresses impulsivity.    Review of Systems  Constitutional: Negative.   HENT: Negative.   Eyes: Negative.   Respiratory: Negative.   Cardiovascular: Negative.   Musculoskeletal: Negative.   Skin: Negative.   Endo/Heme/Allergies: Negative.   Psychiatric/Behavioral: Positive for memory loss.    Blood pressure (!) 115/97, pulse (!) 106, temperature 97.6 F (36.4 C), temperature source Axillary, resp. rate 20, weight 65.8 kg (145 lb), SpO2 99 %.Body mass index is 19.67 kg/m.  General Appearance: Casual  Eye Contact:  Fair  Speech:  Slow  Volume:  Normal  Mood:   Euthymic  Affect:  Blunt  Thought Process:  Less disorganized  Orientation:  Other:  only to person  Thought Content:  Logical at times  Suicidal Thoughts:  NOne  Homicidal Thoughts:  None  Memory:  Immediate;   Fair Recent;   Poor Remote;   Poor  Judgement:  Impaired  Insight:  Lacking  Psychomotor Activity:  Normal  Concentration:  Concentration: Poor and Attention Span: Poor  Recall:  Poor  Fund of Knowledge:  Poor  Language:  Good  Akathisia:  No  Handed:  Right  AIMS (if indicated):     Assets:  Social Support  ADL's:  Impaired  Cognition:  Impaired,  Moderate  Sleep:   fair     Treatment Plan Summary: Daily contact with patient to assess and evaluate symptoms and progress in treatment and Medication management dementia with behavioral changes: -Crisis stabilization -Medication management: Continue Saphris 10 mg S/L bid for agitation/aggressive behavior, Vistaril 25 mg BID for anxiety, Lamictal 150 mg daily for mood stabilization, and Zyprexa 10 mg at bedtime for mood and sleep -Individual counseling  Dicharge to his facility  Waylan Boga, NP 08/25/2016 9:08 AM  Patient seen face-to-face for psychiatric evaluation, chart reviewed and case discussed with the physician extender and developed treatment plan. Reviewed the information documented and agree with the treatment plan. Corena Pilgrim, MD

## 2016-08-25 NOTE — ED Notes (Signed)
Case manager contacted regarding placement for pt. Message left and case manager will call back.

## 2016-08-25 NOTE — ED Notes (Signed)
Pt refused complete vital sign check. I only able to get blood pressure and RR only at this time. Will try again later.

## 2016-08-25 NOTE — ED Notes (Signed)
Call [facility] nursing regarding pt being discharge back to the facility but staff will not except pt at this time until I talk to the admit nurses at the facility. Staff put me on the hall and states that admit nurse was not there now but they will call ED back.

## 2016-08-26 MED ORDER — ZIPRASIDONE MESYLATE 20 MG IM SOLR
20.0000 mg | Freq: Once | INTRAMUSCULAR | Status: AC
  Administered 2016-08-26: 20 mg via INTRAMUSCULAR
  Filled 2016-08-26: qty 20

## 2016-08-26 MED ORDER — STERILE WATER FOR INJECTION IJ SOLN
INTRAMUSCULAR | Status: AC
  Administered 2016-08-26: 10:00:00
  Filled 2016-08-26: qty 10

## 2016-08-26 MED ORDER — HALOPERIDOL LACTATE 5 MG/ML IJ SOLN
5.0000 mg | Freq: Once | INTRAMUSCULAR | Status: AC
  Administered 2016-08-26: 5 mg via INTRAMUSCULAR
  Filled 2016-08-26: qty 1

## 2016-08-26 MED ORDER — LORAZEPAM 2 MG/ML IJ SOLN
2.0000 mg | Freq: Once | INTRAMUSCULAR | Status: AC
  Administered 2016-08-26: 2 mg via INTRAMUSCULAR
  Filled 2016-08-26: qty 1

## 2016-08-27 ENCOUNTER — Emergency Department (HOSPITAL_COMMUNITY): Payer: Medicare Other

## 2016-08-27 DIAGNOSIS — F419 Anxiety disorder, unspecified: Secondary | ICD-10-CM

## 2016-08-27 DIAGNOSIS — F0391 Unspecified dementia with behavioral disturbance: Secondary | ICD-10-CM | POA: Diagnosis not present

## 2016-08-27 DIAGNOSIS — Z79899 Other long term (current) drug therapy: Secondary | ICD-10-CM | POA: Diagnosis not present

## 2016-08-27 DIAGNOSIS — F79 Unspecified intellectual disabilities: Secondary | ICD-10-CM | POA: Diagnosis not present

## 2016-08-27 DIAGNOSIS — R4182 Altered mental status, unspecified: Secondary | ICD-10-CM | POA: Diagnosis not present

## 2016-08-27 DIAGNOSIS — F209 Schizophrenia, unspecified: Secondary | ICD-10-CM | POA: Diagnosis not present

## 2016-08-27 MED ORDER — HALOPERIDOL LACTATE 5 MG/ML IJ SOLN
INTRAMUSCULAR | Status: AC
  Filled 2016-08-27: qty 1

## 2016-08-27 MED ORDER — TRAZODONE HCL 50 MG PO TABS
50.0000 mg | ORAL_TABLET | Freq: Every day | ORAL | Status: DC
  Administered 2016-08-27 – 2016-09-12 (×13): 50 mg via ORAL
  Filled 2016-08-27 (×16): qty 1

## 2016-08-27 MED ORDER — LORAZEPAM 2 MG/ML IJ SOLN
2.0000 mg | Freq: Once | INTRAMUSCULAR | Status: AC
  Administered 2016-09-01: 2 mg via INTRAMUSCULAR
  Filled 2016-08-27: qty 1

## 2016-08-27 MED ORDER — CHLORPROMAZINE HCL 25 MG/ML IJ SOLN
50.0000 mg | Freq: Once | INTRAMUSCULAR | Status: AC
  Administered 2016-08-27: 50 mg via INTRAMUSCULAR
  Filled 2016-08-27: qty 2

## 2016-08-27 MED ORDER — HALOPERIDOL LACTATE 5 MG/ML IJ SOLN
5.0000 mg | Freq: Once | INTRAMUSCULAR | Status: AC
  Administered 2016-08-27: 5 mg via INTRAMUSCULAR
  Filled 2016-08-27: qty 1

## 2016-08-27 MED ORDER — LORAZEPAM 2 MG/ML IJ SOLN
INTRAMUSCULAR | Status: AC
  Administered 2016-08-27: 2 mg
  Filled 2016-08-27: qty 1

## 2016-08-27 MED ORDER — DIPHENHYDRAMINE HCL 50 MG/ML IJ SOLN
50.0000 mg | Freq: Once | INTRAMUSCULAR | Status: AC
  Administered 2016-08-27: 50 mg via INTRAMUSCULAR
  Filled 2016-08-27: qty 1

## 2016-08-27 MED ORDER — DIPHENHYDRAMINE HCL 50 MG/ML IJ SOLN
50.0000 mg | Freq: Once | INTRAMUSCULAR | Status: AC
  Administered 2016-08-27: 50 mg via INTRAMUSCULAR

## 2016-08-27 MED ORDER — ZIPRASIDONE MESYLATE 20 MG IM SOLR
10.0000 mg | Freq: Once | INTRAMUSCULAR | Status: AC
  Administered 2016-08-27: 10 mg via INTRAMUSCULAR
  Filled 2016-08-27: qty 20

## 2016-08-27 MED ORDER — DIPHENHYDRAMINE HCL 50 MG/ML IJ SOLN
INTRAMUSCULAR | Status: AC
  Filled 2016-08-27: qty 1

## 2016-08-27 MED ORDER — CLONAZEPAM 1 MG PO TABS
1.0000 mg | ORAL_TABLET | Freq: Two times a day (BID) | ORAL | Status: DC
  Administered 2016-08-27 – 2016-08-31 (×9): 1 mg via ORAL
  Filled 2016-08-27 (×9): qty 1

## 2016-08-27 MED ORDER — DIVALPROEX SODIUM 500 MG PO DR TAB
500.0000 mg | DELAYED_RELEASE_TABLET | Freq: Two times a day (BID) | ORAL | Status: DC
  Administered 2016-08-27 – 2016-09-17 (×38): 500 mg via ORAL
  Filled 2016-08-27 (×42): qty 1

## 2016-08-27 NOTE — Progress Notes (Signed)
Patient is physically aggressive with staff and is requiring extra assistance. Will continue to monitor.

## 2016-08-27 NOTE — Progress Notes (Signed)
Patient is physically aggressive to staff. Notified Provider and orders received.

## 2016-08-27 NOTE — Progress Notes (Signed)
Patient is calm and resting after medication reconciliation. Will continue to monitor.
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
## 2016-08-27 NOTE — Consult Note (Addendum)
[facility] Face-to-Face Psychiatry Consult   Reason for Consult: aggressive, agitated and combative Referring Physician:  EDP Patient Identification: Antonio Montoya MRN:  [id number] Principal Diagnosis: Dementia with behavioral disturbance Diagnosis:   Patient Active Problem List   Diagnosis Date Noted  . Dementia with behavioral disturbance [F03.91] 07/20/2016    Priority: High  . Aggressive behavior [R45.89]   . Staphylococcus epidermidis bacteremia [R78.81] 06/04/2016  . Urinary tract infection without hematuria [N39.0]   . MR (mental retardation) [F79] 05/30/2016  . Dysphagia [R13.10] 05/30/2016  . Altered mental status [R41.82] 05/28/2016  . Urinary retention [R33.9] 05/04/2016  . Thrombocytopenia ([facility]) [D69.6] 05/04/2016  . Pressure injury of skin [L89.90] 05/02/2016  . Hypothermia [T68.XXXA] 05/02/2016  . Elevated LFTs [R79.89]   . Acute delirium [R41.0] 05/01/2016    Total Time spent with patient: 25 minutes  Subjective:   Antonio Montoya is a 66 y.o. male patient who was admitted due to aggressive behavior towards his facility staff.  Objective:   Patient seen at the request of EDP, he has history of Intellectual disability, Dementia and Schizophrenia. Patient was awaiting placement into a facility after his current facility refused to take him back. However, patient has become more combative, aggressive, confused and disoriented. He is unable to give history and it is difficult to determine what is making him agitated.  Past Psychiatric History: as above  Risk to Self: NOne Risk to Others: None Prior Inpatient Therapy: Prior Inpatient Therapy:  (UTA) Prior Therapy Dates: UTA Prior Therapy Facilty/Provider(s): UTA Reason for Treatment: UTA Prior Outpatient Therapy: Prior Outpatient Therapy:  (UTA) Prior Therapy Dates: UTA Prior Therapy Facilty/Provider(s): UTA Reason for Treatment: UTA Does patient have an ACCT team?:  (UTA) Does patient have Intensive In-House  Services?  :  (UTA) Does patient have [facility] services? :  Rich Reining) Does patient have P4CC services?:  (UTA)  Past Medical History:  Past Medical History:  Diagnosis Date  . MR (mental retardation)   . Schizophrenia [facility])     Past Surgical History:  Procedure Laterality Date  . CYSTOSCOPY N/A 05/18/2016   Procedure: CYSTOSCOPY;  Surgeon: Ihor Gully, MD;  Location[facility];  Service: Urology;  Laterality: N/A;  . TRANSURETHRAL RESECTION OF PROSTATE N/A 05/18/2016   Procedure: TRANSURETHRAL RESECTION OF THE PROSTATE (TURP);  Surgeon: Ihor Gully, MD;  Location: [facility];  Service: Urology;  Laterality: N/A;   Family History:  Family History  Problem Relation Age of Onset  . Family history unknown: Yes   Family Psychiatric  History: unknown Social History:  History  Alcohol Use No     History  Drug Use No    Social History   Social History  . Marital status: Single    Spouse name: N/A  . Number of children: N/A  . Years of education: N/A   Social History Main Topics  . Smoking status: Never Smoker  . Smokeless tobacco: Never Used  . Alcohol use No  . Drug use: No  . Sexual activity: No   Other Topics Concern  . None   Social History Narrative  . None   Additional Social History:    Allergies:  No Known Allergies  Labs:  No results found for this or any previous visit (from the past 48 hour(s)).  Current Facility-Administered Medications  Medication Dose Route Frequency Provider Last Rate Last Dose  . asenapine (SAPHRIS) sublingual tablet 10 mg  10 mg Sublingual BID Kirichenko, Tatyana, PA-C   10 mg at 08/27/16 0809  . clonazePAM (  KLONOPIN) tablet 1 mg  1 mg Oral BID Jannifer FranklinAkintayo, Elysia Grand, MD   1 mg at 08/27/16 1029  . diphenhydrAMINE (BENADRYL) injection 50 mg  50 mg Intramuscular Once Kimmie Berggren, MD      . divalproex (DEPAKOTE) DR tablet 500 mg  500 mg Oral BID Ryanne Morand, MD   500 mg at 08/27/16 1029  . haloperidol lactate (HALDOL) 5 MG/ML injection            . LORazepam (ATIVAN) injection 2 mg  2 mg Intramuscular Once Hiral Lukasiewicz, MD      . pantoprazole (PROTONIX) EC tablet 40 mg  40 mg Oral Daily Kirichenko, Tatyana, PA-C   40 mg at 08/27/16 [id number]  . potassium chloride (K-DUR,KLOR-CON) CR tablet 10 mEq  10 mEq Oral Daily Kirichenko, Tatyana, PA-C   10 mEq at 08/27/16 [id number]  . traZODone (DESYREL) tablet 50 mg  50 mg Oral QHS Thedore MinsAkintayo, Zlatan Hornback, MD       Current Outpatient Prescriptions  Medication Sig Dispense Refill  . asenapine (SAPHRIS) 5 MG SUBL 24 hr tablet Place 2 tablets (10 mg total) under the tongue 2 (two) times daily. 60 tablet 0  . benztropine (COGENTIN) 0.5 MG tablet Take 1 tablet (0.5 mg total) by mouth every evening. 30 tablet 0  . haloperidol (HALDOL) 5 MG tablet Take 1 tablet (5 mg total) by mouth 2 (two) times daily. 10 tablet 0  . pantoprazole (PROTONIX) 40 MG tablet Take 1 tablet (40 mg total) by mouth daily. 30 tablet 0  . potassium chloride (K-DUR,KLOR-CON) 10 MEQ tablet Take 10 mEq by mouth daily.     Marland Kitchen. asenapine (SAPHRIS) 5 MG SUBL 24 hr tablet Place 2 tablets (10 mg total) under the tongue 2 (two) times daily. 60 tablet 0  . hydrOXYzine (ATARAX/VISTARIL) 25 MG tablet Take 1 tablet (25 mg total) by mouth 2 (two) times daily. 60 tablet 0  . lamoTRIgine (LAMICTAL) 150 MG tablet Take 1 tablet (150 mg total) by mouth daily. 30 tablet 0  . OLANZapine zydis (ZYPREXA) 10 MG disintegrating tablet Take 1 tablet (10 mg total) by mouth at bedtime. 30 tablet 0    Musculoskeletal: Strength & Muscle Tone: within normal limits Gait & Station: unsteady Patient leans: N/A  Psychiatric Specialty Exam: Physical Exam  Constitutional: He appears well-developed and well-nourished.  HENT:  Head: Normocephalic.  Neck: Normal range of motion.  Respiratory: Effort normal.  Musculoskeletal: Normal range of motion.  Neurological: He is alert.  Psychiatric: Thought content normal. His affect is blunt. His speech is delayed. He is  agitated, aggressive and combative. Cognition and memory are impaired. He expresses impulsivity.    Review of Systems  Constitutional: Negative.   HENT: Negative.   Eyes: Negative.   Respiratory: Negative.   Cardiovascular: Negative.   Musculoskeletal: Negative.   Skin: Negative.   Endo/Heme/Allergies: Negative.   Psychiatric/Behavioral: Positive for memory loss.    Blood pressure 125/80, pulse 72, temperature 97.6 F (36.4 C), temperature source Axillary, resp. rate 18, weight 65.8 kg (145 lb), SpO2 97 %.Body mass index is 19.67 kg/m.  General Appearance: Casual  Eye Contact:  Fair  Speech:  Slow  Volume:  Normal  Mood: irritable  Affect:  Blunt  Thought Process:   disorganized  Orientation:  Other:  only to person  Thought Content:  illogical  Suicidal Thoughts:  Unable to assess  Homicidal Thoughts:  Unable to assess  Memory:  Immediate;   Fair Recent;   Poor Remote;   Poor  Judgement:  Impaired  Insight:  Lacking  Psychomotor Activity:  Increased  Concentration:  Concentration: Poor and Attention Span: Poor  Recall:  Poor  Fund of Knowledge:  Poor  Language:  Good  Akathisia:  No  Handed:  Right  AIMS (if indicated):     Assets:  Social Support  ADL's:  Impaired  Cognition:  Impaired,  Moderate  Sleep:   fair     Treatment Plan Summary: Daily contact with patient to assess and evaluate symptoms and progress in treatment and Medication management Diagnosis: Dementia with behavioral changes: -Medication management:  Continue Saphris 10 mg S/L bid for agitation/aggressive behavior. -Vistaril 25 mg BID for anxiety -Discontinue  Lamictal, Start Depakote DR 500 mg bid for agitation, Clonazepam 1 mg bid for agitation. -Continue Saphris 10 mg S/L bid for dementia with agitation. -Social work placement    Thedore Mins, MD 08/27/2016 10:33 AM

## 2016-08-27 NOTE — Progress Notes (Signed)
Patient refuse to get his vitals.
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
## 2016-08-28 MED ORDER — CHLORPROMAZINE HCL 25 MG/ML IJ SOLN
50.0000 mg | Freq: Once | INTRAMUSCULAR | Status: AC
  Administered 2016-08-28: 50 mg via INTRAMUSCULAR
  Filled 2016-08-28: qty 2

## 2016-08-28 MED ORDER — DIPHENHYDRAMINE HCL 50 MG/ML IJ SOLN
50.0000 mg | Freq: Once | INTRAMUSCULAR | Status: AC
  Administered 2016-08-28: 50 mg via INTRAMUSCULAR
  Filled 2016-08-28: qty 1

## 2016-08-28 MED ORDER — HALOPERIDOL 5 MG PO TABS
5.0000 mg | ORAL_TABLET | Freq: Once | ORAL | Status: AC
  Administered 2016-08-28: 5 mg via ORAL
  Filled 2016-08-28: qty 1

## 2016-08-28 MED ORDER — DIPHENHYDRAMINE HCL 50 MG/ML IJ SOLN
50.0000 mg | Freq: Once | INTRAMUSCULAR | Status: AC
Start: 2016-08-28 — End: 2016-08-28
  Administered 2016-08-28: 50 mg via INTRAMUSCULAR
  Filled 2016-08-28: qty 1

## 2016-08-28 MED ORDER — CHLORPROMAZINE HCL 25 MG PO TABS: 25.0000 mg | ORAL_TABLET | Freq: Once | ORAL | Status: DC

## 2016-08-28 MED ORDER — LORAZEPAM 2 MG/ML IJ SOLN
2.0000 mg | Freq: Once | INTRAMUSCULAR | Status: AC
  Administered 2016-08-28: 2 mg via INTRAMUSCULAR
  Filled 2016-08-28: qty 1
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
## 2016-08-28 NOTE — ED Notes (Signed)
Two sitters at bedside trying to keep patient in the bed.

## 2016-08-28 NOTE — ED Notes (Signed)
Patient aggressive and combative with staff and is kicking and attempting to get out of bed multiple times. Dr. Ethelda ChickJacubowitz in room to assess patient; new orders obtained. Patient given medication as ordered; pt tolerated well. No signs of distress noted.

## 2016-08-28 NOTE — ED Provider Notes (Signed)
Adjustments to the patient's medication still being performed, and a goal to have more consistent control of his agitation, but given his baseline cognitive deficit as well as history of schizophrenia, this is difficult. Pending symptom control, the patient is awaiting discharge to a group facility.   Gerhard MunchLockwood, Rivaan Kendall, MD 08/28/16 1054

## 2016-08-28 NOTE — ED Notes (Signed)
Non-violent restraints removed at 1000.

## 2016-08-28 NOTE — ED Notes (Signed)
After restraints removed, patient immediately got out of bed and tried to walk, but was extremely unsteady on his feet.  Patient not able to ambulate alone and needed to be assisted back to bed.  Patient difficult to control and security had to be called to assist with directing patient back to bed.  Nurse practitioner and psychiatrist notified.  Verbal order for IM medication given.
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
## 2016-08-28 NOTE — ED Provider Notes (Signed)
Called to room because patient becoming more agitated Nurse asking for medications Pt awake/alert, appears confused and mildly agitated Haldol ordered    Antonio Montoya, Antonio Cudworth, MD 08/28/16 0410

## 2016-08-28 NOTE — ED Notes (Signed)
Patient now resting in bed, beginning to get sleepy from medications given.  Sitter at bedside.

## 2016-08-28 NOTE — ED Notes (Signed)
Patient currently asleep with no signs of distress noted; respirations regular and unlabored. Sitter remains at bedside for safety.

## 2016-08-29 MED ORDER — LORAZEPAM 1 MG PO TABS
2.0000 mg | ORAL_TABLET | ORAL | Status: AC | PRN
  Administered 2016-08-29 – 2016-08-31 (×4): 2 mg via ORAL
  Filled 2016-08-29 (×4): qty 2

## 2016-08-29 NOTE — ED Notes (Signed)
Patient continues to attempt to get out of bed and pull at staff. He is redirectable at this time. Tolerated meal well.

## 2016-08-29 NOTE — Progress Notes (Signed)
CSW spoke with [name] and Lajoyce Cornersvy, representatives from [facility], both stated patient is not able to return to facility at this time. Ivy requested to speak with CSW's Chiropodistassistant director. CSW passed along information. CSW will continue to update discharge plans.   Stacy GardnerErin Rian Koon, LCSWA Clinical Social Worker [phone]

## 2016-08-29 NOTE — ED Notes (Signed)
Im was only able to get the blood pressure and pulse on patient.

## 2016-08-29 NOTE — ED Notes (Signed)
Patient would not allow for all of vital signs to be collected as he became increasingly combative.

## 2016-08-29 NOTE — ED Notes (Signed)
Patient slept during the night. He is up this morning attempting to get out of bed and grabbing at staff.

## 2016-08-30 NOTE — Progress Notes (Addendum)
CSW received a phone call from the CSW Asst Director informing the CSW that since the pt's group home [facility] had refused to re-admit the pt due to the pt's past behaviors the Asst Director had appealed [facility] the [facility].  [facility] had upheld the group home's decision not to re-admit at this time.  A second appeal is being considered, per Asst Director.  Asst Director asked the CSW to create an FL-2.  Per Asst Director pt has a PASSR and the pt will require placement from the ED.  Per Asst Director [facility] the [facility] and the [facility] [place].  CSW will create an FL-2 for the pt.   Antonio Montoya Hara, Antonio MajorsLCSWA, LCAS Clinical Social Worker Ph: [phone]

## 2016-08-30 NOTE — Consult Note (Signed)
   [facility]   08/30/2016  Antonio PiggsCharlie Montoya 1950/07/17 [id number]   [facility] due to increased ED utilization. Chart reviewed. Noted patient is from SNF. Patient is not [facility] due to aggressive behaviors and the like. Discussed with [facility] Assistant Director as well.   Discussed above with ED RNCM.   Will alert [facility] of above.   Raiford NobleAtika Raynaldo Falco, MSN-Ed, RN,BSN [facility] Hospital Liaison [phone]

## 2016-08-30 NOTE — ED Notes (Signed)
Patient took medications without difficulty when mixed with applesauce.

## 2016-08-30 NOTE — Progress Notes (Signed)
CSW created an FL-2 for the pt..  Pt has been calm and cooperative, per the RN now on duty after shift change and this was confirmed by the RN leaving for the day.  Per RN leaving and night-time RN pt has been calm and cooperative since being taken off other medications that were ineffective and since being prescribed ativan the pt has cooperated with staff.  Antonio PeaJonathan F. Murice Barbar, Theresia MajorsLCSWA, LCAS Clinical Social Worker Ph: [phone]

## 2016-08-30 NOTE — NC FL2 (Signed)
[place] MEDICAID FL2 LEVEL OF CARE SCREENING TOOL     IDENTIFICATION  Patient Name: Antonio Montoya Birthdate: [birth year] Sex: male Admission Date (Current Location): 08/23/2016  [place]  [place] [id number] Facility and Address:  [facility],  [address], [place] [id number]      Provider Number: [id number]  Attending Physician Name and Address:  Default, Provider, MD  Relative Name and Phone Number:       Current Level of Care: Hospital Recommended Level of Care: Assisted Living Facility Prior Approval Number:    Date Approved/Denied:   PASRR Number: [id number]  Discharge Plan:  (ALF)    Current Diagnoses: Patient Active Problem List   Diagnosis Date Noted  . Aggressive behavior   . Dementia with behavioral disturbance 07/20/2016  . Staphylococcus epidermidis bacteremia 06/04/2016  . Urinary tract infection without hematuria   . MR (mental retardation) 05/30/2016  . Dysphagia 05/30/2016  . Altered mental status 05/28/2016  . Urinary retention 05/04/2016  . Thrombocytopenia (HCC) 05/04/2016  . Pressure injury of skin 05/02/2016  . Hypothermia 05/02/2016  . Elevated LFTs   . Acute delirium 05/01/2016    Orientation RESPIRATION BLADDER Height & Weight        Normal Incontinent Weight: 145 lb (65.8 kg) Height:     BEHAVIORAL SYMPTOMS/MOOD NEUROLOGICAL BOWEL NUTRITION STATUS     Incontinent Diet (Regular)  AMBULATORY STATUS COMMUNICATION OF NEEDS Skin   Supervision Non-Verbally Normal                       Personal Care Assistance Level of Assistance  Bathing, Dressing Bathing Assistance: Limited assistance   Dressing Assistance: Limited assistance     Functional Limitations Info  Speech     Speech Info: Impaired    SPECIAL CARE FACTORS FREQUENCY                    Contractures Contractures Info: Not present    Additional Factors Info  Code Status, Allergies Code Status Info:  FULL Allergies Info: No known allergie           Current Medications (08/30/2016):  This is the current hospital active medication list Current Facility-Administered Medications  Medication Dose Route Frequency Provider Last Rate Last Dose  . asenapine (SAPHRIS) sublingual tablet 10 mg  10 mg Sublingual BID Kirichenko, Tatyana, PA-C   10 mg at 08/30/16 [id number]  . clonazePAM (KLONOPIN) tablet 1 mg  1 mg Oral BID Thedore MinsAkintayo, Mojeed, MD   1 mg at 08/30/16 [id number]  . divalproex (DEPAKOTE) DR tablet 500 mg  500 mg Oral BID Thedore MinsAkintayo, Mojeed, MD   500 mg at 08/30/16 [id number]  . LORazepam (ATIVAN) injection 2 mg  2 mg Intramuscular Once Akintayo, Mojeed, MD      . LORazepam (ATIVAN) tablet 2 mg  2 mg Oral Q30 min PRN Mesner, Barbara CowerJason, MD   2 mg at 08/30/16 0631  . pantoprazole (PROTONIX) EC tablet 40 mg  40 mg Oral Daily Kirichenko, Tatyana, PA-C   40 mg at 08/30/16 [id number]  . potassium chloride (K-DUR,KLOR-CON) CR tablet 10 mEq  10 mEq Oral Daily Kirichenko, Tatyana, PA-C   10 mEq at 08/30/16 [id number]  . traZODone (DESYREL) tablet 50 mg  50 mg Oral QHS Thedore MinsAkintayo, Mojeed, MD   50 mg at 08/29/16 2113   Current Outpatient Prescriptions  Medication Sig Dispense Refill  . asenapine (SAPHRIS) 5 MG SUBL 24 hr tablet Place 2 tablets (10 mg total) under  the tongue 2 (two) times daily. 60 tablet 0  . benztropine (COGENTIN) 0.5 MG tablet Take 1 tablet (0.5 mg total) by mouth every evening. 30 tablet 0  . haloperidol (HALDOL) 5 MG tablet Take 1 tablet (5 mg total) by mouth 2 (two) times daily. 10 tablet 0  . pantoprazole (PROTONIX) 40 MG tablet Take 1 tablet (40 mg total) by mouth daily. 30 tablet 0  . potassium chloride (K-DUR,KLOR-CON) 10 MEQ tablet Take 10 mEq by mouth daily.     Marland Kitchen asenapine (SAPHRIS) 5 MG SUBL 24 hr tablet Place 2 tablets (10 mg total) under the tongue 2 (two) times daily. 60 tablet 0  . hydrOXYzine (ATARAX/VISTARIL) 25 MG tablet Take 1 tablet (25 mg total) by mouth 2 (two) times daily. 60 tablet 0  .  lamoTRIgine (LAMICTAL) 150 MG tablet Take 1 tablet (150 mg total) by mouth daily. 30 tablet 0  . OLANZapine zydis (ZYPREXA) 10 MG disintegrating tablet Take 1 tablet (10 mg total) by mouth at bedtime. 30 tablet 0     Discharge Medications: Please see discharge summary for a list of discharge medications.  Relevant Imaging Results:  Relevant Lab Results:   Additional Information [id number]  Dorothe Pea Katalaya Beel, LCSWA

## 2016-08-31 MED ORDER — LORAZEPAM 1 MG PO TABS
1.0000 mg | ORAL_TABLET | Freq: Four times a day (QID) | ORAL | Status: DC | PRN
Start: 2016-08-31 — End: 2016-09-17
  Administered 2016-09-01 – 2016-09-13 (×8): 1 mg via ORAL
  Filled 2016-08-31 (×10): qty 1

## 2016-08-31 NOTE — ED Notes (Signed)
Pts agitation is increasing. Meal tray is currently being delivered. Will re-eval after breakfast.

## 2016-08-31 NOTE — ED Notes (Addendum)
ADDENDUM: no edp present

## 2016-08-31 NOTE — Progress Notes (Signed)
CSW sent referrals to:   [facility]  [facility]  [facility]  [facility]  [facility]  [facility]   CSW will contact [facility] due to not being on the HUB for referral.   Stacy GardnerErin Hena Ewalt, [facility]WA Clinical Social Worker [phone]

## 2016-08-31 NOTE — ED Notes (Signed)
Pt attempting to get out of bed. 2 staff members at beside to assist. Pt with garbled speech. Points to things but unable to make sense of what he is asking for. Water is given, patient proceeds to squeeze cup until water is on the floor. Pt gets back in bed and covers face with blanket.

## 2016-08-31 NOTE — NC FL2 (Signed)
[place] MEDICAID FL2 LEVEL OF CARE SCREENING TOOL     IDENTIFICATION  Patient Name: Antonio Montoya Birthdate: 02-Aug-1950 Sex: male Admission Date (Current Location): 08/23/2016  [place] Number:  [place] [id number] Q Facility and Address:  [facility],  [address], [place] [address]      Provider Number: [id number]  Attending Physician Name and Address:  Default, Provider, MD  Relative Name and Phone Number:       Current Level of Care: Hospital Recommended Level of Care: Skilled Nursing Facility Prior Approval Number:    Date Approved/Denied:   PASRR Number: [id number]  Discharge Plan: SNF    Current Diagnoses: Patient Active Problem List   Diagnosis Date Noted  . Aggressive behavior   . Dementia with behavioral disturbance 07/20/2016  . Staphylococcus epidermidis bacteremia 06/04/2016  . Urinary tract infection without hematuria   . MR (mental retardation) 05/30/2016  . Dysphagia 05/30/2016  . Altered mental status 05/28/2016  . Urinary retention 05/04/2016  . Thrombocytopenia (HCC) 05/04/2016  . Pressure injury of skin 05/02/2016  . Hypothermia 05/02/2016  . Elevated LFTs   . Acute delirium 05/01/2016    Orientation RESPIRATION BLADDER Height & Weight        Normal Incontinent Weight: 145 lb (65.8 kg) Height:     BEHAVIORAL SYMPTOMS/MOOD NEUROLOGICAL BOWEL NUTRITION STATUS  Dangerous to self, others or property   Incontinent Diet (DYS 3)  AMBULATORY STATUS COMMUNICATION OF NEEDS Skin   Total Care Non-Verbally Normal                       Personal Care Assistance Level of Assistance  Total care Bathing Assistance: Maximum assistance Feeding assistance: Maximum assistance Dressing Assistance: Maximum assistance Total Care Assistance: Maximum assistance   Functional Limitations Info  Speech     Speech Info: Impaired    SPECIAL CARE FACTORS FREQUENCY                    Contractures Contractures  Info: Not present    Additional Factors Info  Code Status, Allergies Code Status Info: Full Code  Allergies Info: NKA            Current Medications (08/31/2016):  This is the current hospital active medication list Current Facility-Administered Medications  Medication Dose Route Frequency Provider Last Rate Last Dose  . asenapine (SAPHRIS) sublingual tablet 10 mg  10 mg Sublingual BID Kirichenko, Tatyana, PA-C   10 mg at 08/31/16 0958  . clonazePAM (KLONOPIN) tablet 1 mg  1 mg Oral BID Thedore Mins, MD   1 mg at 08/31/16 0958  . divalproex (DEPAKOTE) DR tablet 500 mg  500 mg Oral BID Thedore Mins, MD   500 mg at 08/31/16 0958  . LORazepam (ATIVAN) injection 2 mg  2 mg Intramuscular Once Akintayo, Mojeed, MD      . pantoprazole (PROTONIX) EC tablet 40 mg  40 mg Oral Daily Kirichenko, Tatyana, PA-C   40 mg at 08/31/16 0958  . potassium chloride (K-DUR,KLOR-CON) CR tablet 10 mEq  10 mEq Oral Daily Kirichenko, Tatyana, PA-C   10 mEq at 08/31/16 0958  . traZODone (DESYREL) tablet 50 mg  50 mg Oral QHS Thedore Mins, MD   50 mg at 08/30/16 2125   Current Outpatient Prescriptions  Medication Sig Dispense Refill  . asenapine (SAPHRIS) 5 MG SUBL 24 hr tablet Place 2 tablets (10 mg total) under the tongue 2 (two) times daily. 60 tablet 0  . benztropine (  COGENTIN) 0.5 MG tablet Take 1 tablet (0.5 mg total) by mouth every evening. 30 tablet 0  . haloperidol (HALDOL) 5 MG tablet Take 1 tablet (5 mg total) by mouth 2 (two) times daily. 10 tablet 0  . pantoprazole (PROTONIX) 40 MG tablet Take 1 tablet (40 mg total) by mouth daily. 30 tablet 0  . potassium chloride (K-DUR,KLOR-CON) 10 MEQ tablet Take 10 mEq by mouth daily.     Marland Kitchen. asenapine (SAPHRIS) 5 MG SUBL 24 hr tablet Place 2 tablets (10 mg total) under the tongue 2 (two) times daily. 60 tablet 0  . hydrOXYzine (ATARAX/VISTARIL) 25 MG tablet Take 1 tablet (25 mg total) by mouth 2 (two) times daily. 60 tablet 0  . lamoTRIgine (LAMICTAL)  150 MG tablet Take 1 tablet (150 mg total) by mouth daily. 30 tablet 0  . OLANZapine zydis (ZYPREXA) 10 MG disintegrating tablet Take 1 tablet (10 mg total) by mouth at bedtime. 30 tablet 0     Discharge Medications: Please see discharge summary for a list of discharge medications.  Relevant Imaging Results:  Relevant Lab Results:   Additional Information [id number]  Donnie CoffinErin M Maclovia Uher, LCSW

## 2016-08-31 NOTE — ED Provider Notes (Signed)
Patient is assessed for for purpose general daily progress note:   Patient awaits placement by social work. He had been brought to the hospital due to aggressive behaviors with history of MR, schizophrenia and dementia. By review of EMR and bedside staff who have been attending the patient, he is nonverbal in the sense that speech is mostly unintelligible. Patient was cleared by psychiatry (6/16) in terms of not meeting criteria for psychiatry admission and thus patient has been awaiting placement in a new nursing home facility as the prior one did not apparently have adequate level of care for the behavioral patient's problems.   Today in the morning the patient was somewhat agitated and needed a significant amount redirecting. Given Ativan which has helped. Patient does refuse to wear any garments on his lower half. At times he is up and active and moving about, currently he is lying in a fetal position watching cartoons.. Reportedly he ate well and at times tries to communicate but speech is generally unintelligible. Behaviors have been similar over the course of his stay.  Patient is lying in the bed in a fetal position without any pants or undergarments on. He is wearing a shirt. Watching cartoons. No respiratory distress. He does not interact with me verbally. Mouth hangs slightly agape. Lungs grossly clear without wheezes rhonchi rale. Heart is regular. Abdomen is soft and nondistended. Skin is warm and dry. No peripheral edema. No injuries or rashes to extremities.  Plan: Continue current medications. Psychiatry has signed off on ongoing medical\medication management for behavioral disorder with dementia and schizophrenia. Vegetative functions intact. Will continue current medications as were previously established by psychiatry with Ativan on when necessary basis which [facility] and continued.   Arby BarrettePfeiffer, Kyson Kupper, MD 08/31/16 1432

## 2016-08-31 NOTE — ED Notes (Signed)
ED Provider at bedside. 

## 2016-08-31 NOTE — Progress Notes (Addendum)
Patient was clear psychiatrically on 08/25/2016.  Patient's Haldol was discontinued and Ativan 2 mg PRN, stabilized.  He is at his baseline and stable psychiatrically, there would be no benefit from an inpatient psychiatric hospitalization.  Nanine MeansJamison Lord, PMH-NP

## 2016-08-31 NOTE — ED Notes (Signed)
Pt diaper was dry when check by the Clinical research associatewriter

## 2016-08-31 NOTE — Progress Notes (Signed)
CSW [facility] [place]. CSW spoke with KohlerMalinda at [phone] and fax: [phone]. Malinda currently reviewing patient for admission. CSW updated patients legal guardian, Ralph DowdyStella Womble, regarding current plan. Legal guardian has no questions or concerns and appreciated any update from ED team. CSW will continue to update with discharge plans.  Ralph DowdyStella Womble (legal guardian): [phone]  Stacy GardnerErin Lummie Montijo, [facility]CSWA Clinical Social Worker [phone]

## 2016-08-31 NOTE — ED Notes (Signed)
Pt cleaned of urinary incontinence.

## 2016-09-01 MED ORDER — FLUCONAZOLE 100 MG PO TABS
100.0000 mg | ORAL_TABLET | Freq: Once | ORAL | Status: AC
  Administered 2016-09-01: 100 mg via ORAL
  Filled 2016-09-01: qty 1

## 2016-09-01 MED ORDER — NYSTATIN 100000 UNIT/ML MT SUSP
5.0000 mL | Freq: Four times a day (QID) | OROMUCOSAL | Status: DC
  Administered 2016-09-01 – 2016-09-17 (×41): 500000 [IU] via ORAL
  Filled 2016-09-01 (×66): qty 5

## 2016-09-01 NOTE — ED Notes (Signed)
Dr. Adriana Simasook came over to look in patient's mouth to evaluate white coated tongue.  Orders pending.

## 2016-09-01 NOTE — ED Notes (Signed)
Pt attempting to get out of bed, grabbing at sitter.

## 2016-09-01 NOTE — ED Notes (Signed)
Dr. Rhunette CroftNanavati informed of white coating to pt's tongue.

## 2016-09-01 NOTE — ED Notes (Signed)
Patient started to become restless and difficult to keep in the bed.  IM ativan given per order.  Sitter at bedside redirecting patient.

## 2016-09-01 NOTE — Progress Notes (Signed)
[facility] contacted [facility] [phone] to inquire about patient's referral. Staff member Antonio JonesCarolyn reported that admissions staff would not be available until Monday. CSW will follow up Monday.  CSW contacted [facility] [phone] to inquire about patient's referral. Staff member Antonio Manislizabeth reported that admissions staff was not at the facility today and provided CSW with contact information for admissions staff Antonio Montoya(Antonio Montoya [phone]). CSW attempted to contact  and received no answer. CSW left voicemail requesting return phone call.   [facility]W contacted [facility] [phone]) to inquire about patient's referral. Staff member Antonio Montoya reported that admissions staff would not be available until Monday. CSW will follow up Monday.   Antonio SickleKimberly Dilynn Montoya, LCSWA [facility]  Clinical Social Worker [phone]

## 2016-09-02 NOTE — ED Notes (Signed)
Patient sleeping.  Unable to give morning medications.

## 2016-09-02 NOTE — ED Notes (Signed)
Patient wouldn't allow nurse or tech to obtain vitals.  He became agitated.

## 2016-09-02 NOTE — Progress Notes (Signed)
CSW followed up with admissions staff [facility]. Staff reported that they do not have any male beds on their memory care unit. CSW will continue to other facilities that patient has been referred to.   Celso SickleKimberly Indie Nickerson, LCSWA [facility]  Clinical Social Worker [phone]

## 2016-09-02 NOTE — ED Notes (Signed)
Patient asleep.  Will catch up patient on medications when he wakes up.  Sitter at bedside.

## 2016-09-02 NOTE — Progress Notes (Signed)
CSW followed up with staff member Tammy from [facility] regarding patient's referral. Staff reported that patient's referral was reviewed and that they are not able to offer a bed.   Celso SickleKimberly Makalyn Lennox, LCSWA [facility]  Clinical Social Worker [phone]

## 2016-09-02 NOTE — ED Provider Notes (Signed)
  Physical Exam  BP (!) 158/77 (BP Location: Left Arm)   Pulse 67   Temp 97.8 F (36.6 C) (Axillary)   Resp 17   Wt 65.8 kg (145 lb)   SpO2 95%   BMI 19.67 kg/m   Physical Exam  ED Course  Procedures  MDM Patient still pending placement by social work. Calm this AM, continue current meds.         Charlynne PanderYao, David Hsienta, MD 09/02/16 [phone]

## 2016-09-03 NOTE — Progress Notes (Signed)
CSW spoke with [name] representative [facility]. Patient is currently under review and representative will visit with patient tomorrow 6/25 to determine if patient is appropriate for placement. CSW requested PT evaluation to determine patients ability to function on own. CSW will Product managerupdate director and continue to update discharge plans.   Stacy GardnerErin Nicolaas Savo, LCSWA Clinical Social Worker [phone]

## 2016-09-03 NOTE — Progress Notes (Addendum)
CSW contacted the following facilities regarding placement:   [facility] [phone]: Not able to offer bed at this time  [facility]: Not able to offer bed at this time [facility] [phone]: admissions currently looking at records, will follow up.  [facility] [phone]: admissions currently looking at records, will follow up.   [facility] [phone]: admissions currently looking at records, will follow up.   Stacy GardnerErin Flois Mctague, LCSWA Clinical Social Worker [phone]

## 2016-09-03 NOTE — ED Notes (Addendum)
Patient aggressive and trying to get out of bed. Unable to take medications.

## 2016-09-04 NOTE — Progress Notes (Signed)
09/04/16  1000 Patient walked with physical therapy about 15 feet with rolling walker and 2 person assist.

## 2016-09-04 NOTE — Progress Notes (Signed)
09/04/16  1158  Patient enjoys watching cartoons.

## 2016-09-04 NOTE — Evaluation (Signed)
Physical Therapy Evaluation Patient Details Name: Antonio Montoya MRN: [id number] DOB: [birth year] Today's Date: 09/04/2016   History of Present Illness  Pt admitted 2* AMS and agressive behavior.  Pt with hx of Mental Retardation, Schizoprenia, and Dementia  Clinical Impression  Pt admitted as above and presenting with functional mobility limitations 2* balance deficits, poor safety awareness and cognition.  Pt is currently requiring assist of 2 for safe mobilization and would benefit from follow up rehab at SNF level to maximize IND and safety.Pg [phone]     Follow Up Recommendations SNF;Supervision/Assistance - 24 hour    Equipment Recommendations  None recommended by PT    Recommendations for Other Services       Precautions / Restrictions Precautions Precautions: Fall Restrictions Weight Bearing Restrictions: No      Mobility  Bed Mobility Overal bed mobility: Needs Assistance Bed Mobility: Supine to Sit     Supine to sit: Min assist     General bed mobility comments: Tactile cues and ++ encouragement to gain pt participation  Transfers Overall transfer level: Needs assistance Equipment used: 2 person hand held assist Transfers: Sit to/from Stand Sit to Stand: Min assist;Mod assist;+2 physical assistance;+2 safety/equipment         General transfer comment: Tactile cues and ++ encouragement required to gain pt participation.  Physical assist to bring wt up and fwd and to maintain balance in standing  Ambulation/Gait Ambulation/Gait assistance: Min assist;Mod assist;+2 physical assistance;+2 safety/equipment Ambulation Distance (Feet): 50 Feet Assistive device: None Gait Pattern/deviations: Step-through pattern;Decreased step length - right;Decreased step length - left;Shuffle;Trunk flexed;Staggering left;Staggering right Gait velocity: decr Gait velocity interpretation: Below normal speed for age/gender General Gait Details: Pt demonstrating  significant instability and requiring physical assist to prevent falls.  Stairs            Wheelchair Mobility    Modified Rankin (Stroke Patients Only)       Balance Overall balance assessment: Needs assistance Sitting-balance support: No upper extremity supported;Feet supported Sitting balance-Leahy Scale: Fair     Standing balance support: Bilateral upper extremity supported Standing balance-Leahy Scale: Poor                               Pertinent Vitals/Pain Pain Assessment: No/denies pain    Home Living Family/patient expects to be discharged to:: Other (Comment)                      Prior Function           Comments: Pt unable to provide details.  CNA knows pt from previous admissions and states pt is generally ambulatory but a little unsteady     Hand Dominance        Extremity/Trunk Assessment   Upper Extremity Assessment Upper Extremity Assessment: Difficult to assess due to impaired cognition (WFL strength/ROM based on observed movement)    Lower Extremity Assessment Lower Extremity Assessment: Difficult to assess due to impaired cognition (ROM/strength WFL based on observed movements)    Cervical / Trunk Assessment Cervical / Trunk Assessment: Kyphotic  Communication   Communication: Expressive difficulties (Speech largely unintelligible)  Cognition Arousal/Alertness: Awake/alert Behavior During Therapy: Impulsive Overall Cognitive Status: History of cognitive impairments - at baseline  General Comments      Exercises     Assessment/Plan    PT Assessment Patient needs continued PT services  PT Problem List Decreased activity tolerance;Decreased balance;Decreased mobility;Decreased cognition;Decreased knowledge of use of DME;Decreased safety awareness       PT Treatment Interventions DME instruction;Gait training;Functional mobility training;Therapeutic  activities;Therapeutic exercise;Patient/family education;Balance training    PT Goals (Current goals can be found in the Care Plan section)  Acute Rehab PT Goals Patient Stated Goal: No goals stated PT Goal Formulation: Patient unable to participate in goal setting Time For Goal Achievement: 09/18/16 Potential to Achieve Goals: Fair    Frequency Min 3X/week   Barriers to discharge        Co-evaluation               AM-PAC PT "6 Clicks" Daily Activity  Outcome Measure Difficulty turning over in bed (including adjusting bedclothes, sheets and blankets)?: Total Difficulty moving from lying on back to sitting on the side of the bed? : Total Difficulty sitting down on and standing up from a chair with arms (e.g., wheelchair, bedside commode, etc,.)?: Total Help needed moving to and from a bed to chair (including a wheelchair)?: A Lot Help needed walking in hospital room?: A Lot Help needed climbing 3-5 steps with a railing? : A Lot 6 Click Score: 9    End of Session Equipment Utilized During Treatment: Gait belt Activity Tolerance: Patient limited by fatigue Patient left: in bed;with call bell/phone within reach;with bed alarm set Nurse Communication: Mobility status PT Visit Diagnosis: Difficulty in walking, not elsewhere classified (R26.2)    Time: 1610-9604 PT Time Calculation (min) (ACUTE ONLY): 20 min   Charges:   PT Evaluation $PT Eval Moderate Complexity: 1 Procedure     PT G Codes:   PT G-Codes **NOT FOR INPATIENT CLASS** Functional Assessment Tool Used: AM-PAC 6 Clicks Basic Mobility Functional Limitation: Mobility: Walking and moving around Mobility: Walking and Moving Around Current Status (V4098): At least 60 percent but less than 80 percent impaired, limited or restricted Mobility: Walking and Moving Around Goal Status [phone]): At least 20 percent but less than 40 percent impaired, limited or restricted    Pg [phone]  [phone]   Darcell Sabino 09/04/2016, 12:26 PM

## 2016-09-04 NOTE — Progress Notes (Addendum)
CSW attempted to contact [name] representative with [facility] with no answer. CSW left voice mail for return call.   CSW [facility] who are continuing to look at patients records.   CSW reached out to [name] with [employer] with no answer. CSW will continue to attempt to contact.   Stacy GardnerErin Ramin Zoll, LCSWA Clinical Social Worker [phone]

## 2016-09-04 NOTE — Progress Notes (Signed)
CSW spoke with patients care coordinator with [facility], Talmage Napmanda Monahan [phone]. Patient has previously been in residential care facilities and was at [facility] in [place] however they were unable to take patient back because they did not have a care giver. Ms. Cheral AlmasMonahan requested information from currently hospital stay to determine if patient would need a higher level of care such as intermediate care facility. Ms. Cheral AlmasMonahan stated patient was able to walk and feed himself in the past. CSW will have MD review meds in the AM and [facility] will also review meds. Patient legal guardian, Ralph DowdyStella Womble, is payee. At this time, Ms. Cheral AlmasMonahan states if patient is requiring additional care a SNF may be the most appropriate placement from the hospital while [facility] seeks long-term residential placement.   Stacy GardnerErin Bayleigh Loflin, LCSWA Clinical Social Worker [phone]

## 2016-09-04 NOTE — ED Provider Notes (Signed)
11:53 AM Patient was assessed for daily evaluation. Patient resting calmly watching cartoons. Patient smiled at this examiner and had no complaints when asked. Patient's lungs were clear and abdomen was nontender. Patient had no issues overnight according to nursing.  According to social work, patient still waiting placement. Will continue current medication therapy as he seems to be doing very well.  No problems or issues seen today. Anticipate continued stay in the ED.   Tegeler, Canary Brimhristopher J, MD 09/04/16 [phone]

## 2016-09-04 NOTE — Progress Notes (Signed)
09/04/16  1210  Patient taking medicines crushed in applesauce.

## 2016-09-05 NOTE — ED Notes (Signed)
Pt cleaned of urinary incontinence.  Bed linens changed.

## 2016-09-05 NOTE — ED Provider Notes (Deleted)
Lying in bed . Awake alert denies complaint stable Results for orders placed or performed during the hospital encounter of 08/23/16  CBC with Differential  Result Value Ref Range   WBC 7.3 4.0 - 10.5 K/uL   RBC 3.98 (L) 4.22 - 5.81 MIL/uL   Hemoglobin 12.6 (L) 13.0 - 17.0 g/dL   HCT 16.1 (L) 09.6 - 04.5 %   MCV 95.0 78.0 - 100.0 fL   MCH 31.7 26.0 - 34.0 pg   MCHC 33.3 30.0 - 36.0 g/dL   RDW 40.9 81.1 - 91.4 %   Platelets 188 150 - 400 K/uL   Neutrophils Relative % 70 %   Neutro Abs 5.0 1.7 - 7.7 K/uL   Lymphocytes Relative 23 %   Lymphs Abs 1.7 0.7 - 4.0 K/uL   Monocytes Relative 6 %   Monocytes Absolute 0.5 0.1 - 1.0 K/uL   Eosinophils Relative 1 %   Eosinophils Absolute 0.1 0.0 - 0.7 K/uL   Basophils Relative 0 %   Basophils Absolute 0.0 0.0 - 0.1 K/uL  Comprehensive metabolic panel  Result Value Ref Range   Sodium 143 135 - 145 mmol/L   Potassium 4.1 3.5 - 5.1 mmol/L   Chloride 106 101 - 111 mmol/L   CO2 29 22 - 32 mmol/L   Glucose, Bld 83 65 - 99 mg/dL   BUN 20 6 - 20 mg/dL   Creatinine, Ser 7.82 0.61 - 1.24 mg/dL   Calcium 9.3 8.9 - 95.6 mg/dL   Total Protein 6.8 6.5 - 8.1 g/dL   Albumin 3.6 3.5 - 5.0 g/dL   AST 23 15 - 41 U/L   ALT 11 (L) 17 - 63 U/L   Alkaline Phosphatase 77 38 - 126 U/L   Total Bilirubin 0.5 0.3 - 1.2 mg/dL   GFR calc non Af Amer >60 >60 mL/min   GFR calc Af Amer >60 >60 mL/min   Anion gap 8 5 - 15  Ethanol  Result Value Ref Range   Alcohol, Ethyl (B) <5 <5 mg/dL   Dg Chest 2 View  Result Date: 08/27/2016 CLINICAL DATA:  Altered mental status. Medical clearance. Dementia. EXAM: CHEST  2 VIEW COMPARISON:  07/23/2016 FINDINGS: The cardiac silhouette remains enlarged, similar to the prior study. No airspace consolidation, edema, pleural effusion, or pneumothorax is identified. No acute osseous abnormality is seen. IMPRESSION: No active cardiopulmonary disease. Electronically Signed   By: Sebastian Ache M.D.   On: 08/27/2016 13:45   Ct Head Wo  Contrast  Result Date: 08/23/2016 CLINICAL DATA:  66 year old male with agitation and violent behavior today. Head injury while being restrained, posterior scalp hematoma. EXAM: CT HEAD WITHOUT CONTRAST CT CERVICAL SPINE WITHOUT CONTRAST TECHNIQUE: Multidetector CT imaging of the head and cervical spine was performed following the standard protocol without intravenous contrast. Multiplanar CT image reconstructions of the cervical spine were also generated. COMPARISON:  Head CT without contrast 05/28/2016 and earlier. FINDINGS: CT HEAD FINDINGS Brain: Cerebral volume remains normal. No midline shift, ventriculomegaly, mass effect, evidence of mass lesion, intracranial hemorrhage or evidence of cortically based acute infarction. Gray-white matter differentiation is within normal limits throughout the brain. Vascular: Mild Calcified atherosclerosis at the skull base. No suspicious intracranial vascular hyperdensity. Skull: Stable and intact. Sinuses/Orbits: Visualized paranasal sinuses and mastoids are stable and well pneumatized. Mild chronic mucoperiosteal thickening in the frontal sinuses greater on the left. Other: A suboccipital scalp lipoma is noted to the right of midline (series 4, image 9). No scalp hematoma identified.  Visualized orbit soft tissues are within normal limits. CT CERVICAL SPINE FINDINGS Alignment: Straightening of cervical lordosis. Cervicothoracic junction alignment is within normal limits. Bilateral posterior element alignment is within normal limits. 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 Visualized skull base is intact. No atlanto-occipital dissociation. No acute cervical spine fracture identified. Soft tissues and spinal canal: No prevertebral fluid or swelling. No visible canal hematoma. Otherwise negative noncontrast neck soft tissues. Disc levels: Chronic C4-C5 fusion or ankylosis with severe adjacent segment disease at both the C3-C4  and C5-C6 levels. Very severe facet arthropathy on the left at C3-C4. Vacuum disc at both levels, endplate degeneration including subchondral cysts and spurring. Chronic severe disc and endplate degeneration also at the remaining cervical spine levels. Degenerative ligamentous hypertrophy about the odontoid. Mild spinal stenosis suspected at C3-C4. Upper chest: Visible upper thoracic levels appear intact. Partially visible upper lobe emphysema. IMPRESSION: 1. Stable and normal noncontrast CT appearance of the brain. 2. No scalp hematoma or skull fracture identified. 3. Severe cervical spine degeneration but no acute fracture or listhesis identified. 4. Emphysema. Electronically Signed   By: Odessa FlemingH  Hall M.D.   On: 08/23/2016 16:41   Ct Cervical Spine Wo Contrast  Result Date: 08/23/2016 CLINICAL DATA:  66 year old male with agitation and violent behavior today. Head injury while being restrained, posterior scalp hematoma. EXAM: CT HEAD WITHOUT CONTRAST CT CERVICAL SPINE WITHOUT CONTRAST TECHNIQUE: Multidetector CT imaging of the head and cervical spine was performed following the standard protocol without intravenous contrast. Multiplanar CT image reconstructions of the cervical spine were also generated. COMPARISON:  Head CT without contrast 05/28/2016 and earlier. FINDINGS: CT HEAD FINDINGS Brain: Cerebral volume remains normal. No midline shift, ventriculomegaly, mass effect, evidence of mass lesion, intracranial hemorrhage or evidence of cortically based acute infarction. Gray-white matter differentiation is within normal limits throughout the brain. Vascular: Mild Calcified atherosclerosis at the skull base. No suspicious intracranial vascular hyperdensity. Skull: Stable and intact. Sinuses/Orbits: Visualized paranasal sinuses and mastoids are stable and well pneumatized. Mild chronic mucoperiosteal thickening in the frontal sinuses greater on the left. Other: A suboccipital scalp lipoma is noted to the right of  midline (series 4, image 9). No scalp hematoma identified. Visualized orbit soft tissues are within normal limits. CT CERVICAL SPINE FINDINGS Alignment: Straightening of cervical lordosis. Cervicothoracic junction alignment is within normal limits. Bilateral posterior element alignment is within normal limits. 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 Visualized skull base is intact. No atlanto-occipital dissociation. No acute cervical spine fracture identified. Soft tissues and spinal canal: No prevertebral fluid or swelling. No visible canal hematoma. Otherwise negative noncontrast neck soft tissues. Disc levels: Chronic C4-C5 fusion or ankylosis with severe adjacent segment disease at both the C3-C4 and C5-C6 levels. Very severe facet arthropathy on the left at C3-C4. Vacuum disc at both levels, endplate degeneration including subchondral cysts and spurring. Chronic severe disc and endplate degeneration also at the remaining cervical spine levels. Degenerative ligamentous hypertrophy about the odontoid. Mild spinal stenosis suspected at C3-C4. Upper chest: Visible upper thoracic levels appear intact. Partially visible upper lobe emphysema. IMPRESSION: 1. Stable and normal noncontrast CT appearance of the brain. 2. No scalp hematoma or skull fracture identified. 3. Severe cervical spine degeneration but no acute fracture or listhesis identified. 4. Emphysema. Electronically Signed   By: Odessa FlemingH  Hall M.D.   On: 08/23/2016 16:41     Doug SouJacubowitz, Chariti Havel, MD 09/05/16 [phone]

## 2016-09-05 NOTE — Progress Notes (Addendum)
CSW [facility] requesting additional information about patient. CSW provided Ms. Temple with past/ current information regarding hospital stay and medical diagnosis and barriers to discharge. Ms. Coralee Northemple [facility] and will provide CSW will information to facility. Ms. Coralee Northemple requested care coordinator information and psychological information. Ms. Coralee Northemple will reach out to care coordinator to discuss additional resources and continue to follow up with CSW.   11:10PM: CSW received phone call from medical director who spoke with [facility] who are wanting to move forward with state facility at this time. Care coordinator will need to complete application for facility ASAP for patient to be accepted. Annia BeltCindy Temple is agreeable to plan and CSW will continue to update medical director/ [facility]. CSW attempted to contact Talmage NapAmanda Monahan x2 with no answer- voicemail left for return call.  12:30PM: CSW spoke with legal guardian regarding plans for potential plans for placement at state hospital- at this time patients legal guardian is agreeable and stated "i [facility] and where ever that is I am happy". CSW stated she would continue to update legal guardian with any updated. CSW still awaiting Talmage Napmanda Monahan return call.   1:15PM: CSW received return call from Talmage NapAmanda Monahan, patients care coordinator with [facility], to provide her with updated information from medical director. Ms. Cheral AlmasMonahan stated she could only complete state referral IF legal guardian was agreeable (she would have to confirm this conversation). Per Ms. Cheral AlmasMonahan, [facility] not expire until July 21 if [facility]. [facility] is considered "medical home" at this time and can be reached for additional assistance for placement. Beckie SaltsKim Hilliard [phone] is contact for [facility]. Ms.  Cheral AlmasMonahan requested Fl2 to work on recommendation for higher level of care. CSW will update medical director of new information and continue to update.  CSW will continue to reach out to care coordinator for assistance.   Stacy GardnerErin Lilo Wallington, LCSWA Clinical Social Worker [phone]

## 2016-09-05 NOTE — ED Notes (Signed)
Pt refusing meds, spat out the depakote & trazodone.

## 2016-09-05 NOTE — ED Provider Notes (Addendum)
Lying in bed no distress, noves all extremities lungs cta cor bradycardic rr Results for orders placed or performed during the hospital encounter of 08/23/16  CBC with Differential  Result Value Ref Range   WBC 7.3 4.0 - 10.5 K/uL   RBC 3.98 (L) 4.22 - 5.81 MIL/uL   Hemoglobin 12.6 (L) 13.0 - 17.0 g/dL   HCT 16.1 (L) 09.6 - 04.5 %   MCV 95.0 78.0 - 100.0 fL   MCH 31.7 26.0 - 34.0 pg   MCHC 33.3 30.0 - 36.0 g/dL   RDW 40.9 81.1 - 91.4 %   Platelets 188 150 - 400 K/uL   Neutrophils Relative % 70 %   Neutro Abs 5.0 1.7 - 7.7 K/uL   Lymphocytes Relative 23 %   Lymphs Abs 1.7 0.7 - 4.0 K/uL   Monocytes Relative 6 %   Monocytes Absolute 0.5 0.1 - 1.0 K/uL   Eosinophils Relative 1 %   Eosinophils Absolute 0.1 0.0 - 0.7 K/uL   Basophils Relative 0 %   Basophils Absolute 0.0 0.0 - 0.1 K/uL  Comprehensive metabolic panel  Result Value Ref Range   Sodium 143 135 - 145 mmol/L   Potassium 4.1 3.5 - 5.1 mmol/L   Chloride 106 101 - 111 mmol/L   CO2 29 22 - 32 mmol/L   Glucose, Bld 83 65 - 99 mg/dL   BUN 20 6 - 20 mg/dL   Creatinine, Ser 7.82 0.61 - 1.24 mg/dL   Calcium 9.3 8.9 - 95.6 mg/dL   Total Protein 6.8 6.5 - 8.1 g/dL   Albumin 3.6 3.5 - 5.0 g/dL   AST 23 15 - 41 U/L   ALT 11 (L) 17 - 63 U/L   Alkaline Phosphatase 77 38 - 126 U/L   Total Bilirubin 0.5 0.3 - 1.2 mg/dL   GFR calc non Af Amer >60 >60 mL/min   GFR calc Af Amer >60 >60 mL/min   Anion gap 8 5 - 15  Ethanol  Result Value Ref Range   Alcohol, Ethyl (B) <5 <5 mg/dL   Dg Chest 2 View  Result Date: 08/27/2016 CLINICAL DATA:  Altered mental status. Medical clearance. Dementia. EXAM: CHEST  2 VIEW COMPARISON:  07/23/2016 FINDINGS: The cardiac silhouette remains enlarged, similar to the prior study. No airspace consolidation, edema, pleural effusion, or pneumothorax is identified. No acute osseous abnormality is seen. IMPRESSION: No active cardiopulmonary disease. Electronically Signed   By: Sebastian Ache M.D.   On:  08/27/2016 13:45   Ct Head Wo Contrast  Result Date: 08/23/2016 CLINICAL DATA:  66 year old male with agitation and violent behavior today. Head injury while being restrained, posterior scalp hematoma. EXAM: CT HEAD WITHOUT CONTRAST CT CERVICAL SPINE WITHOUT CONTRAST TECHNIQUE: Multidetector CT imaging of the head and cervical spine was performed following the standard protocol without intravenous contrast. Multiplanar CT image reconstructions of the cervical spine were also generated. COMPARISON:  Head CT without contrast 05/28/2016 and earlier. FINDINGS: CT HEAD FINDINGS Brain: Cerebral volume remains normal. No midline shift, ventriculomegaly, mass effect, evidence of mass lesion, intracranial hemorrhage or evidence of cortically based acute infarction. Gray-white matter differentiation is within normal limits throughout the brain. Vascular: Mild Calcified atherosclerosis at the skull base. No suspicious intracranial vascular hyperdensity. Skull: Stable and intact. Sinuses/Orbits: Visualized paranasal sinuses and mastoids are stable and well pneumatized. Mild chronic mucoperiosteal thickening in the frontal sinuses greater on the left. Other: A suboccipital scalp lipoma is noted to the right of midline (series 4, image 9).  No scalp hematoma identified. Visualized orbit soft tissues are within normal limits. CT CERVICAL SPINE FINDINGS Alignment: Straightening of cervical lordosis. Cervicothoracic junction alignment is within normal limits. Bilateral posterior element alignment is within normal limits. 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 Visualized skull base is intact. No atlanto-occipital dissociation. No acute cervical spine fracture identified. Soft tissues and spinal canal: No prevertebral fluid or swelling. No visible canal hematoma. Otherwise negative noncontrast neck soft tissues. Disc levels: Chronic C4-C5 fusion or ankylosis with severe adjacent  segment disease at both the C3-C4 and C5-C6 levels. Very severe facet arthropathy on the left at C3-C4. Vacuum disc at both levels, endplate degeneration including subchondral cysts and spurring. Chronic severe disc and endplate degeneration also at the remaining cervical spine levels. Degenerative ligamentous hypertrophy about the odontoid. Mild spinal stenosis suspected at C3-C4. Upper chest: Visible upper thoracic levels appear intact. Partially visible upper lobe emphysema. IMPRESSION: 1. Stable and normal noncontrast CT appearance of the brain. 2. No scalp hematoma or skull fracture identified. 3. Severe cervical spine degeneration but no acute fracture or listhesis identified. 4. Emphysema. Electronically Signed   By: Odessa Fleming M.D.   On: 08/23/2016 16:41   Ct Cervical Spine Wo Contrast  Result Date: 08/23/2016 CLINICAL DATA:  66 year old male with agitation and violent behavior today. Head injury while being restrained, posterior scalp hematoma. EXAM: CT HEAD WITHOUT CONTRAST CT CERVICAL SPINE WITHOUT CONTRAST TECHNIQUE: Multidetector CT imaging of the head and cervical spine was performed following the standard protocol without intravenous contrast. Multiplanar CT image reconstructions of the cervical spine were also generated. COMPARISON:  Head CT without contrast 05/28/2016 and earlier. FINDINGS: CT HEAD FINDINGS Brain: Cerebral volume remains normal. No midline shift, ventriculomegaly, mass effect, evidence of mass lesion, intracranial hemorrhage or evidence of cortically based acute infarction. Gray-white matter differentiation is within normal limits throughout the brain. Vascular: Mild Calcified atherosclerosis at the skull base. No suspicious intracranial vascular hyperdensity. Skull: Stable and intact. Sinuses/Orbits: Visualized paranasal sinuses and mastoids are stable and well pneumatized. Mild chronic mucoperiosteal thickening in the frontal sinuses greater on the left. Other: A suboccipital  scalp lipoma is noted to the right of midline (series 4, image 9). No scalp hematoma identified. Visualized orbit soft tissues are within normal limits. CT CERVICAL SPINE FINDINGS Alignment: Straightening of cervical lordosis. Cervicothoracic junction alignment is within normal limits. Bilateral posterior element alignment is within normal limits. 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 Visualized skull base is intact. No atlanto-occipital dissociation. No acute cervical spine fracture identified. Soft tissues and spinal canal: No prevertebral fluid or swelling. No visible canal hematoma. Otherwise negative noncontrast neck soft tissues. Disc levels: Chronic C4-C5 fusion or ankylosis with severe adjacent segment disease at both the C3-C4 and C5-C6 levels. Very severe facet arthropathy on the left at C3-C4. Vacuum disc at both levels, endplate degeneration including subchondral cysts and spurring. Chronic severe disc and endplate degeneration also at the remaining cervical spine levels. Degenerative ligamentous hypertrophy about the odontoid. Mild spinal stenosis suspected at C3-C4. Upper chest: Visible upper thoracic levels appear intact. Partially visible upper lobe emphysema. IMPRESSION: 1. Stable and normal noncontrast CT appearance of the brain. 2. No scalp hematoma or skull fracture identified. 3. Severe cervical spine degeneration but no acute fracture or listhesis identified. 4. Emphysema. Electronically Signed   By: Odessa Fleming M.D.   On: 08/23/2016 16:41     Doug Sou, MD 09/05/16 [phone]  Doug SouJacubowitz, Bao Bazen, MD 09/05/16 [phone]

## 2016-09-05 NOTE — Progress Notes (Signed)
Physical Therapy Treatment Patient Details Name: Antonio PiggsCharlie Montoya MRN: [id number] DOB: 1950/10/29 Today's Date: 09/05/2016    History of Present Illness Pt admitted 2* AMS and agressive behavior.  Pt with hx of Mental Retardation, Schizoprenia, and Dementia    PT Comments    Pt demonstrating good functional strength all 4s but unwilling to progress to OOB activity despite ++ encouragement.      Follow Up Recommendations  SNF;Supervision/Assistance - 24 hour     Equipment Recommendations  None recommended by PT    Recommendations for Other Services       Precautions / Restrictions Precautions Precautions: Fall Restrictions Weight Bearing Restrictions: No    Mobility  Bed Mobility Overal bed mobility: Needs Assistance Bed Mobility: Supine to Sit;Sit to Supine     Supine to sit: Min assist Sit to supine: Independent   General bed mobility comments: Pt able to sit upright in bed unassisted but with multiple attempts to sit at EOB, pt assisted to bring LEs over EOB and pt pulled right back into bed and pulled covers up.  Transfers                 General transfer comment: NT, pt determined to stay in bed and keeps pulling up into bed with all attempts  Ambulation/Gait                 Stairs            Wheelchair Mobility    Modified Rankin (Stroke Patients Only)       Balance                               High Level Balance Comments: Pt            Cognition Arousal/Alertness: Awake/alert Behavior During Therapy: Impulsive Overall Cognitive Status: History of cognitive impairments - at baseline                                        Exercises      General Comments        Pertinent Vitals/Pain Pain Assessment: No/denies pain    Home Living                      Prior Function            PT Goals (current goals can now be found in the care plan section) Acute Rehab PT Goals Patient  Stated Goal: No goals stated PT Goal Formulation: Patient unable to participate in goal setting Time For Goal Achievement: 09/18/16 Potential to Achieve Goals: Fair Progress towards PT goals: Not progressing toward goals - comment (Ltd willingness to participate)    Frequency    Min 3X/week      PT Plan Current plan remains appropriate    Co-evaluation              AM-PAC PT "6 Clicks" Daily Activity  Outcome Measure  Difficulty turning over in bed (including adjusting bedclothes, sheets and blankets)?: Total Difficulty moving from lying on back to sitting on the side of the bed? : Total Difficulty sitting down on and standing up from a chair with arms (e.g., wheelchair, bedside commode, etc,.)?: Total Help needed moving to and from a bed to chair (including a wheelchair)?: A Lot Help needed walking in  hospital room?: A Lot Help needed climbing 3-5 steps with a railing? : A Lot 6 Click Score: 9    End of Session   Activity Tolerance: Other (comment) (Tx limited - pt unwilling to participate despite ++encourage) Patient left: in bed;with call bell/phone within reach;with bed alarm set Nurse Communication: Mobility status PT Visit Diagnosis: Difficulty in walking, not elsewhere classified (R26.2)     Time: 1191-4782 PT Time Calculation (min) (ACUTE ONLY): 11 min  Charges:  $Therapeutic Activity: 8-22 mins                    G Codes:       Pg [phone]    Donalyn Schneeberger 09/05/2016, 2:44 PM

## 2016-09-06 NOTE — Progress Notes (Signed)
LATE POST 6/27:  CSW updated care coordinator, Antonio Montoya, that immediate discharge plan was needed for 6/28. Ms. Antonio Montoya stated no discharge plans were solidified at this time and [facility] was to have a meeting regarding the next steps for patient. Ms. Antonio Montoya stated if immediate discharge was needed[facility] this time due to discharge notice not being complete until 7/21. Ms. Antonio Montoya notified CSW that [facility]( patients previous residential care home) was current "medical home" and provided emergency housing. CSW contacted Antonio Montoya with [facility] at [phone] who stated patient was discharge from their facility in March 2018 however they agreed to continue to seek new placement for patient. [facility] has yet to be successful due to patient not being able to walk stairs at this time. CSW will provide new information to [facility] geologistmedical director.   Antonio Montoya, LCSWA Clinical Social Worker [phone]
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
## 2016-09-06 NOTE — ED Provider Notes (Signed)
Pt resting comfortably. Lungs CTA b/l, heart with RRR, abdomen soft and non-distended. No agitation during my exam. Staff reports he ate his entire breakfast this morning. Will continue to await placement.   Tomma Ehinger, Ambrose Finlandachel Morgan, MD 09/06/16 1102

## 2016-09-06 NOTE — Progress Notes (Signed)
CSW received call from Antonio Montoya with [facility] regarding current disposition plans. At this time, [facility]- per director patient needs SNF placement at this time. CSW informed Ms. Montoya numerous SNF's have denied patient at this time. Ms. Antonio Montoya requested [facility]. Per Ms. Montoya, [facility] provider will visit with patient to determine if level of care will need to be changed (SNF vs. ICF).   Per medical directors request, PASRR change in condition was sent- currently in manual review.   CSW updated social work Health visitordirector and medically director. Will continue to updated with any additional discharge information.   Antonio Montoya, LCSWA Clinical Social Worker [phone]

## 2016-09-07 NOTE — ED Provider Notes (Signed)
Patient is resting comfortably. No complaints. Awaiting placement.   Pricilla LovelessGoldston, Willford Rabideau, MD 09/07/16 [phone]

## 2016-09-07 NOTE — Progress Notes (Signed)
Ileana RoupStacey Bradley with [facility] plans to meet with patient on Monday 7/02 at 9:30AM. Ms. Elige RadonBradley requested CSW inform legal guardian of meeting and request she be present for meeting. CSW will contact legal guardian.   Stacy GardnerErin Deetra Booton, LCSWA Clinical Social Worker [phone]

## 2016-09-07 NOTE — Progress Notes (Signed)
CSW sent requested information to [facility].   Stacy GardnerErin [place], [facility] Institute For RehabilitationCSWA Clinical Social Worker [phone]
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
## 2016-09-08 NOTE — ED Notes (Signed)
Tried to wake up patient to eat lunch but he is sleeping soundly.

## 2016-09-08 NOTE — ED Notes (Signed)
Pt refused pulse oximetry and temp.

## 2016-09-08 NOTE — ED Provider Notes (Signed)
Went to evaluate patient and he is quiet. Opens his eyes and follows commands. Shakes head yes/no to questioning but not forthcoming with longer answers.  Lungs clear Heart slow but regular No tenderness in abdomen/back/chest  Seems he is still awaiting placement. Stable to continue waiting.   Rim Thatch, Barbara CowerJason, MD 09/08/16 1200

## 2016-09-08 NOTE — Progress Notes (Signed)
PT Cancellation Note  Patient Details Name: Antonio Montoya MRN: [id number] DOB: [birth year]   Cancelled Treatment:     PT attempted this am.  Pt adamantly refusing attempts to mobilize. Pt shaking head, pulling legs up into fetal position, and pulling covers up over head.   Will follow.   Sapphira Harjo 09/08/2016, 11:20 AM

## 2016-09-09 NOTE — ED Provider Notes (Signed)
  Physical Exam  BP (!) 149/86 (BP Location: Left Arm)   Pulse (!) 46   Temp (!) 96.5 F (35.8 C) (Axillary)   Resp 18   Wt 65.8 kg (145 lb)   SpO2 97%   BMI 19.67 kg/m   Physical Exam  ED Course  Procedures  MDM Daily rounding on patient done. Still pending placement. Patient would not respond to my questions but was watching TV.       Benjiman CorePickering, Joffre Lucks, MD 09/09/16 [phone]

## 2016-09-09 NOTE — ED Notes (Signed)
Pt again refusing temperature or oxygen sats to be checked.

## 2016-09-09 NOTE — ED Notes (Signed)
Pt received regular food tray instead of dysphasia tray. Nutritional services notified

## 2016-09-09 NOTE — ED Notes (Addendum)
Pt removed tabs from mouth & dropped on floor.  Pt also refused to be changed.

## 2016-09-10 NOTE — ED Notes (Signed)
Patient fed breakfast.  Ate 100% with assistance.  Able to feed himself finger foods but must be fed the rest.

## 2016-09-10 NOTE — ED Notes (Addendum)
Pt assisted from recliner to the bed. Pt refused at first then decided to get up and move the bed with minimal assistant.

## 2016-09-10 NOTE — Progress Notes (Addendum)
CSW met with Kerrin Mo representative from [facility] today 7/02. At this time CSW is awaiting [facility] decision of PASRR number and level of care required for patient. Once level of care is decided- CSW will make appropriate contacts.   CSW will continue to update legal guardian and ED providers working with patients.   Kingsley Spittle, LCSWA Clinical Social Worker [phone]

## 2016-09-10 NOTE — ED Notes (Signed)
Pt refused vitals signs taken at this time. Pt also refused adult brief change.

## 2016-09-10 NOTE — Care Management (Signed)
Patient still awaiting placement.

## 2016-09-10 NOTE — Progress Notes (Signed)
Physical Therapy Treatment Patient Details Name: Antonio Montoya MRN: [id number] DOB: 1950-05-30 Today's Date: 09/10/2016    History of Present Illness Pt admitted 2* AMS and agressive behavior.  Pt with hx of Mental Retardation, Schizoprenia, and Dementia    PT Comments    Pt resistant to any OOB activity but noted pt was incont urine and bed was soaked.  Attempted encouragement by offering clean bed/gown and bathroom.  Pt still resistant to move.  With help from NT, assisted OOB Total Assist to amb to bathroom.  Once pt saw the toilet, he was more cooperative.  Required + 2 Total Assist off toilet and assisted with hygiene as he was able to static stand holding rail.  Assisted with amb a limited distance side by side + 2 assist in hallway alternating feet and holding self upright but as soon as he saw a chair he started to motion that was and buckle.  Pt sat in upright chair x 7 min with leg crossed as bed and room was cleaned.  Attempted to amb back to room, pt resisted by "skiing" recliner brought to pt.  Positioned in recliner and cartoons on TV.     Follow Up Recommendations  SNF;Supervision/Assistance - 24 hour     Equipment Recommendations       Recommendations for Other Services       Precautions / Restrictions Precautions Precautions: Fall Precaution Comments: Hx MR, Schizo, dementia Restrictions Weight Bearing Restrictions: No    Mobility  Bed Mobility Overal bed mobility: Needs Assistance Bed Mobility: Supine to Sit     Supine to sit: +2 for physical assistance;+2 for safety/equipment;Total assist     General bed mobility comments: pt rigid and resistant but had to be assisted out of wet (incont urine) bed.  Once upright, pt was able to stand and support self.    Transfers Overall transfer level: Needs assistance Equipment used: 2 person hand held assist Transfers: Sit to/from Stand Sit to Stand: Total assist;+2 physical assistance;+2 safety/equipment          General transfer comment: + 2 side by side assist from bed and off toilet plus on/off stright chair.  MAX encouragement  Ambulation/Gait Ambulation/Gait assistance: +2 physical assistance;Max assist;Total assist;+2 safety/equipment Ambulation Distance (Feet): 42 Feet Assistive device: None Gait Pattern/deviations: Step-to pattern;Step-through pattern;Narrow base of support Gait velocity: decr   General Gait Details: + 2 side by side assist (no AD due to cognition) limited distance as soon as pt saw a Britt Bottomchir he began to buckle before reaching chair completely.    Stairs            Wheelchair Mobility    Modified Rankin (Stroke Patients Only)       Balance                                            Cognition Arousal/Alertness: Awake/alert   Overall Cognitive Status: History of cognitive impairments - at baseline                                 General Comments: required MAX/TOTAL encouragement to get OOB      Exercises      General Comments        Pertinent Vitals/Pain Pain Assessment: Faces Faces Pain Scale: Hurts even more Pain Location: motions  to L ear Pain Intervention(s): Monitored during session    Home Living                      Prior Function            PT Goals (current goals can now be found in the care plan section) Progress towards PT goals: Progressing toward goals    Frequency    Min 3X/week      PT Plan Current plan remains appropriate    Co-evaluation              AM-PAC PT "6 Clicks" Daily Activity  Outcome Measure  Difficulty turning over in bed (including adjusting bedclothes, sheets and blankets)?: Total Difficulty moving from lying on back to sitting on the side of the bed? : Total Difficulty sitting down on and standing up from a chair with arms (e.g., wheelchair, bedside commode, etc,.)?: Total Help needed moving to and from a bed to chair (including a wheelchair)?:  Total Help needed walking in hospital room?: Total Help needed climbing 3-5 steps with a railing? : Total 6 Click Score: 6    End of Session   Activity Tolerance: Other (comment) (pt self limiting) Patient left: in chair;with call bell/phone within reach Nurse Communication:  (RN observed session from behind nurses station) PT Visit Diagnosis: Difficulty in walking, not elsewhere classified (R26.2)     Time: 1610-9604 PT Time Calculation (min) (ACUTE ONLY): 31 min  Charges:  $Gait Training: 8-22 mins $Therapeutic Activity: 8-22 mins                    G Codes:       Felecia Shelling  PTA [facility]  Acute  Rehab Pager      [phone]

## 2016-09-10 NOTE — ED Notes (Signed)
Patient walking with PT through the unit with 2 person assist.  Patient sitting up in chair in his room.  Had large BM today.

## 2016-09-10 NOTE — ED Notes (Signed)
Patient given bed bath, linen changed.  Medications given in applesauce.  No difficulty taking medications.

## 2016-09-11 NOTE — ED Provider Notes (Signed)
Blood pressure (!) 155/77, pulse (!) 54, temperature 97.9 F (36.6 C), temperature source Oral, resp. rate 16, weight 65.8 kg (145 lb), SpO2 100 %.  In short, Antonio Montoya is a 66 y.o. male with a chief complaint of Altered Mental Status .  Refer to the original H&P for additional details.  Daily rounding complete. Awaiting placement.   Alona BeneJoshua Shacora Zynda, MD    Maia PlanLong, Abass Misener G, MD 09/11/16 [phone]

## 2016-09-11 NOTE — ED Notes (Signed)
Pt's brief was urine soaked and gown/bed soaked as well.  It took 3 staff to get pt cleaned up.  Pt was resisting getting cleaned up but tolerated well overall.

## 2016-09-12 NOTE — ED Notes (Addendum)
Pt given BB, clean brief and linens provided.  Pt fed applesauce and Sprite.

## 2016-09-12 NOTE — Progress Notes (Addendum)
Patient received PASRR number: [id number] which indicates patient is not approipate for skilled nursing.   At this time patients care coordinator, Talmage NapAmanda Monahan with [facility], will complete [facility] Application. Care Coordinator will follow up with CSW and provide immediate discharge plans.   CSW has updated Investment banker, operationalassistant director and medical director.   Stacy GardnerErin Chasity Outten, LCSWA Clinical Social Worker [phone]

## 2016-09-12 NOTE — ED Notes (Signed)
Patient assessed and is clean and dry at this time.  Patient refused to allow us to assess his oxygen saturation or temp.

## 2016-09-13 LAB — COMPREHENSIVE METABOLIC PANEL
ALT: 9 U/L — ABNORMAL LOW (ref 17–63)
ANION GAP: 4 — AB (ref 5–15)
AST: 22 U/L (ref 15–41)
Albumin: 3.6 g/dL (ref 3.5–5.0)
Alkaline Phosphatase: 66 U/L (ref 38–126)
BUN: 23 mg/dL — ABNORMAL HIGH (ref 6–20)
CHLORIDE: 106 mmol/L (ref 101–111)
CO2: 32 mmol/L (ref 22–32)
Calcium: 9.4 mg/dL (ref 8.9–10.3)
Creatinine, Ser: 0.96 mg/dL (ref 0.61–1.24)
Glucose, Bld: 92 mg/dL (ref 65–99)
POTASSIUM: 3.7 mmol/L (ref 3.5–5.1)
SODIUM: 142 mmol/L (ref 135–145)
Total Bilirubin: 0.4 mg/dL (ref 0.3–1.2)
Total Protein: 7.1 g/dL (ref 6.5–8.1)

## 2016-09-13 LAB — CBC WITH DIFFERENTIAL/PLATELET
Basophils Absolute: 0 10*3/uL (ref 0.0–0.1)
Basophils Relative: 0 %
EOS PCT: 2 %
Eosinophils Absolute: 0.2 10*3/uL (ref 0.0–0.7)
HCT: 39.7 % (ref 39.0–52.0)
Hemoglobin: 14.2 g/dL (ref 13.0–17.0)
LYMPHS ABS: 2.2 10*3/uL (ref 0.7–4.0)
Lymphocytes Relative: 22 %
MCH: 31.8 pg (ref 26.0–34.0)
MCHC: 35.8 g/dL (ref 30.0–36.0)
MCV: 88.8 fL (ref 78.0–100.0)
MONO ABS: 0.6 10*3/uL (ref 0.1–1.0)
Monocytes Relative: 6 %
Neutro Abs: 7 10*3/uL (ref 1.7–7.7)
Neutrophils Relative %: 70 %
PLATELETS: 185 10*3/uL (ref 150–400)
RBC: 4.47 MIL/uL (ref 4.22–5.81)
RDW: 11.9 % (ref 11.5–15.5)
WBC: 9.9 10*3/uL (ref 4.0–10.5)

## 2016-09-13 MED ORDER — TRAZODONE HCL 100 MG PO TABS
100.0000 mg | ORAL_TABLET | Freq: Every day | ORAL | Status: DC
  Administered 2016-09-13 – 2016-09-15 (×3): 100 mg via ORAL
  Filled 2016-09-13 (×4): qty 1

## 2016-09-13 NOTE — Progress Notes (Signed)
CSW attempted to contact care coordinator, Talmage NapAmanda Monahan, with no answer- voicemail left for return call. CSW attempted to contact supervisor, Amanda Peaarin McDonald, with no answer- voicemail let for return call.   Stacy GardnerErin Jaxson Keener, LCSWA Clinical Social Worker [phone]

## 2016-09-13 NOTE — ED Notes (Signed)
Pt removed brief.  New brief applied.

## 2016-09-13 NOTE — ED Notes (Signed)
Pt cleaned of urinary incontinence.  Bed linens also changed.

## 2016-09-13 NOTE — ED Notes (Signed)
RN attempted to take oral and axillary temp, patient refused.  Temp was 98.0 axillary with the NT at 0830  Barrie LymeVance, Cristyn Crossno E RN 9:10 AM 09/13/2016

## 2016-09-13 NOTE — ED Notes (Signed)
Called PT to determine why they did not perform care today.  Did not receive call back.  Pt resting comfortably in bed.  Will continue to monitor.  Barrie LymeVance, Mlissa Tamayo E RN 09/13/2016 1600

## 2016-09-13 NOTE — ED Notes (Signed)
Pt has not slept all night, moved constantly, watched TV and has continued to pull up his gown and remove any covering placed on him.  Pt continues to pull on diaper and has removed the filler in 2 diapers.

## 2016-09-13 NOTE — ED Provider Notes (Signed)
Pt seen on rounds. RN and MSW report that pt is "restless". VS,including recheck temp normal. Pt was up to BR and had BM today. Ambulated. Pt awake. Restless. Moving about in bed.  Clear lungs. Abd soft.  Will repeat labs, as pt on meds. Likely agitation 2/2 poor sleep in busy hospital environment. Will ask that at hs pt lights and TV off. Minimize commotion, noise, light, stimulation. Will increase Trazodone to 100mg  qhs.   Rolland PorterJames, Kaytlyn Din, MD 09/13/16 [phone]

## 2016-09-13 NOTE — Progress Notes (Addendum)
-  CSW [facility] from [facility], [phone], and Amanda Peaarin McDonald, [phone], with [facility]. At this time [facility] is seeking immediate placement at:   [facility]  [facility]  [facility]   CSW questioned if [facility] application had been completed at this time. Care coordinators [place] the [place] patient would qualify for [facility]. CSW questioned if [facility] application had been completed- care coordinators stated " no he has not been denied at group homes yet".   1:22PM [facility] rep visited with patient and CSW. Representatives will follow up with [facility] regarding potentially taking patient. CSW will follow up with [facility].    CSW updated Investment banker, operationalassistant director and medical director.   Stacy GardnerErin Meila Berke, LCSWA Clinical Social Worker [phone]

## 2016-09-13 NOTE — ED Notes (Signed)
Pt cleaned of urinary incontinence.  Bed linens also changed. 

## 2016-09-13 NOTE — ED Notes (Addendum)
Pt pulling on sheets; is restless.

## 2016-09-14 NOTE — Progress Notes (Signed)
PT Cancellation Note  Patient Details Name: Antonio Montoya MRN: [id number] DOB: 1951/01/16   Cancelled Treatment:     attempted to get pt OOB + 2 assist however unable due to non compliance.  Pt kept pulling his gown over his head.  Pt was resistant to applying footies.  When I turned on lights and raised HOB pt rolled over to opposite side and curled up.  "Let's get up and watch cartoons".  "Let me help you to the bathroom".  All attempts failed.  Staff reports pt was up most of the night and seems to have his night and days mixed up.  Will cont to monitor for placement.    Felecia ShellingLori Krystal Teachey  PTA [facility]  Acute  Rehab Pager      [phone]

## 2016-09-14 NOTE — Progress Notes (Addendum)
CSW received call from Talmage NapAmanda Monahan, patients care coordinator with [facility], stated patient had been accepted to [facility]. [facility] and Group Home will need to fill out paperwork before patient can be transported to group home. Anticipated discharge to group home is Monday 7/09. Patient will need copy of AVS and any new prescriptions to be sent with group home at discharge.   CSW will complete any needed paperwork and update EDP/ RN. CSW Investment banker, operationalassistant director and medical director have been notified.   Stacy GardnerErin Claudell Wohler, LCSWA Clinical Social Worker [phone]

## 2016-09-14 NOTE — Progress Notes (Signed)
CSW received call from Amanda Peaarin McDonald with [facility] requesting CSW to complete Level of Care Assessment for the [facility] Waiver. CSW spoke with director to verify the completion of this assessment. Weekend CSW to complete this form and fax back to [facility] for placement. CSW will notify weekend social worker.   Stacy GardnerErin Jojo Geving, LCSWA Clinical Social Worker [phone]

## 2016-09-14 NOTE — ED Notes (Signed)
Pt cleaned of urinary incontinence; linens changed.
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
## 2016-09-15 NOTE — ED Notes (Signed)
Patient resting in bed, watching television. No signs or symptoms of distress noted at this time. Respirations regular and unlabored; skin warm and dry. No complaints or agitation currently.

## 2016-09-15 NOTE — Progress Notes (Signed)
CSW completed Level of Care Assessment for the [facility] Waiver, patient's attending EDP signed boths forms. CSW faxed completed forms to [phone] and received confirmation memory transmission report. CSW contacted Amanda PeaCarin McDonald [phone] and informed her that forms had been faxed. CSW inquired if anything else was needed, she reported that nothing else was needed. She reported that the group home would possibly contact the ED CSW Monday morning in regards to placement and transportation.   Celso SickleKimberly Yarrow Montoya, LCSWA [facility]  Clinical Social Worker [phone]

## 2016-09-15 NOTE — Progress Notes (Signed)
Pt has been awake the entire night. Antonio Montoya P Chadrick Sprinkle

## 2016-09-15 NOTE — ED Provider Notes (Signed)
  Physical Exam  BP (!) 131/100 (BP Location: Left Arm)   Pulse 68   Temp (!) 97.4 F (36.3 C) (Axillary)   Resp 20   Wt 65.8 kg (145 lb)   SpO2 100%   BMI 19.67 kg/m   Physical Exam  ED Course  Procedures  MDM Patient has been in the ED for the last 2 weeks for AMS, confusion. Social work has been involved in looking for placement. Likely return to group home on Monday, in 2 days. Repeat labs 2 days ago unremarkable. Has been eating and drinking well per nursing. Medically cleared.         Charlynne PanderYao, David Hsienta, MD 09/15/16 [phone]

## 2016-09-16 NOTE — ED Provider Notes (Signed)
  Physical Exam  BP (!) 131/100 (BP Location: Left Arm)   Pulse 68   Temp (!) 97.4 F (36.3 C) (Axillary)   Resp 20   Wt 65.8 kg (145 lb)   SpO2 100%   BMI 19.67 kg/m   Physical Exam  ED Course  Procedures  MDM Patient has been in the ED for awhile for AMS. Placement pending. Sleeping comfortably, no issues per nursing.    Charlynne PanderYao, Myrle Wanek Hsienta, MD 09/16/16 [phone]

## 2016-09-16 NOTE — ED Notes (Signed)
No respiratory or acute distress noted resting in bed with eyes closed still refuses to let nurse get vital signs. Pt still dry.

## 2016-09-16 NOTE — ED Notes (Signed)
Resting in bed with eyes closed no respiratory or acute distress noted awakens to verbal stimuli.

## 2016-09-16 NOTE — ED Notes (Signed)
Checked pt bed is dry refused medication food or drink. Pulled pt up in bed due to being low in bed alert and talking no respiratory or acute distress noted.

## 2016-09-16 NOTE — ED Notes (Signed)
Patient brief changed, as well as a full linen and gown change. Patient incontinent of urine. No distress noted. Bed alarm on for safety.

## 2016-09-16 NOTE — ED Notes (Signed)
No respiratory or acute distress noted resting in bed with eyes closed bed dry at this time.
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
## 2016-09-16 NOTE — ED Notes (Signed)
Patient ate all food on both breakfast and lunch trays.  Feeding himself after set up.  Patient remains incontinent. Not able to alert nurse when he needs to toilet.

## 2016-09-17 MED ORDER — HYDROXYZINE HCL 25 MG PO TABS: 25.0000 mg | ORAL_TABLET | Freq: Two times a day (BID) | ORAL | 0 refills | Status: AC

## 2016-09-17 MED ORDER — OLANZAPINE 10 MG PO TBDP: 10.0000 mg | ORAL_TABLET | Freq: Every day | ORAL | 0 refills | Status: AC

## 2016-09-17 MED ORDER — LAMOTRIGINE 150 MG PO TABS: 150.0000 mg | ORAL_TABLET | Freq: Every day | ORAL | 0 refills | Status: AC

## 2016-09-17 NOTE — Progress Notes (Signed)
CSW spoke with Jake SamplesAthena, [facility]  [facility]. He reports he spoke with patient LG Francena HanlyStella. CSW called Francena HanlyStella updated, she reports she spoke with Duffy RhodyStanley this am. About discharge. No other needs identified at this time.   Vivi BarrackNicole Laneta Guerin, Theresia MajorsLCSWA, MSW Clinical Social Worker 5E and Psychiatric Service Line [phone] 09/17/2016  2:46 PM

## 2016-09-17 NOTE — Progress Notes (Signed)
Physical Therapy Discharge Patient Details Name: Antonio Montoya MRN: [id number] DOB: 1950/06/26 Today's Date: 09/17/2016 Time:  -     Patient discharged from PT services secondary to patient has made no progress toward goals in a reasonable time frame.  Please see latest therapy progress note for current level of functioning and progress toward goals.    CaspianKaren Susy Placzek PT [phone]   GP     [name], [name] 09/17/2016, 12:11 PM

## 2016-09-17 NOTE — Progress Notes (Signed)
CSW left voicemail for patient sister about patient personal items. CSW informed Chiropodistassistant director about Group Home concerns.   Vivi BarrackNicole Alaa Eyerman, Theresia MajorsLCSWA, MSW Clinical Social Worker 5E and Psychiatric Service Line [phone] 09/17/2016  4:15 PM

## 2016-09-17 NOTE — ED Notes (Signed)
Water given to pt and pt let nurse get vital signs pt still dry in attends pad.

## 2016-09-17 NOTE — ED Notes (Signed)
Changed pt due to urine in attends pad and changed bed due to wetness no skin breakdown noted on back or buttocks turned pt to left side to get off of back call light in reach.  Pt unable to help turn to assist nurse and tech for cleaning.

## 2016-09-17 NOTE — ED Notes (Signed)
No respiratory or acute distress noted alert in bed watching tv call light in reach.

## 2016-09-17 NOTE — ED Notes (Signed)
No respiratory or acute distress noted resting in bed pt refuses vital signs to be taken pt is still dry no urine noted.

## 2016-09-17 NOTE — ED Notes (Signed)
Per Joni ReiningNicole, SW we are anticipating a call and the arrival of the group home to take patient back to facility.

## 2016-09-17 NOTE — ED Provider Notes (Signed)
66 year old male here awaiting nursing home placement. He is resting completely. Is also has a stable. She'll be placed today   [name], [name], MD 09/17/16 1425

## 2016-09-17 NOTE — ED Notes (Signed)
Facility is present to pick up patient and is concerned they do not have any of his pre-admission medications or personal belongings. Advised that we only have the items he arrived in and could not [facility] as he did not arrive with them. Spoke with Joni ReiningNicole, SW who advised that she was going to call Francena HanlyStella (his sister) who is the legal guardian to facilitate with the new home and old home to obtain his personal items. AVS and [facility] (x3) [facility] director and information reviewed.

## 2016-09-17 NOTE — Progress Notes (Addendum)
CSW spoke with Duffy [facility]Stanley w/ [facility]. He plans to pick up patient today at 2:30pm. He inquired about patient behaviors and medications. CSW informed per nurse, patient had no behaviors overnight or this morning.  Patient medication orders to be resumed will be on d/c summary.  CSW following to assist with discharge.   CSW left voicemail for patient Legal Guardian-Stella.   Vivi BarrackNicole Ashrith Sagan, Theresia MajorsLCSWA, MSW Clinical Social Worker 5E and Psychiatric Service Line [phone] 09/17/2016  9:53 AM

## 2016-09-20 DIAGNOSIS — Z046 Encounter for general psychiatric examination, requested by authority: Secondary | ICD-10-CM | POA: Diagnosis not present

## 2016-09-20 DIAGNOSIS — F259 Schizoaffective disorder, unspecified: Secondary | ICD-10-CM | POA: Diagnosis not present

## 2016-09-20 DIAGNOSIS — F73 Profound intellectual disabilities: Secondary | ICD-10-CM | POA: Diagnosis not present

## 2016-09-20 DIAGNOSIS — I4510 Unspecified right bundle-branch block: Secondary | ICD-10-CM | POA: Diagnosis not present

## 2016-09-20 DIAGNOSIS — R4589 Other symptoms and signs involving emotional state: Secondary | ICD-10-CM | POA: Diagnosis not present

## 2016-09-20 DIAGNOSIS — R001 Bradycardia, unspecified: Secondary | ICD-10-CM | POA: Diagnosis not present

## 2016-09-20 DIAGNOSIS — I4581 Long QT syndrome: Secondary | ICD-10-CM | POA: Diagnosis not present

## 2016-09-20 DIAGNOSIS — Z609 Problem related to social environment, unspecified: Secondary | ICD-10-CM | POA: Diagnosis not present

## 2016-09-20 DIAGNOSIS — R7989 Other specified abnormal findings of blood chemistry: Secondary | ICD-10-CM | POA: Diagnosis not present

## 2016-09-20 DIAGNOSIS — F0281 Dementia in other diseases classified elsewhere with behavioral disturbance: Secondary | ICD-10-CM | POA: Diagnosis not present

## 2016-09-20 DIAGNOSIS — Z599 Problem related to housing and economic circumstances, unspecified: Secondary | ICD-10-CM | POA: Diagnosis not present

## 2016-09-20 DIAGNOSIS — F29 Unspecified psychosis not due to a substance or known physiological condition: Secondary | ICD-10-CM | POA: Diagnosis not present

## 2016-09-20 DIAGNOSIS — J320 Chronic maxillary sinusitis: Secondary | ICD-10-CM | POA: Diagnosis not present

## 2016-09-20 DIAGNOSIS — R410 Disorientation, unspecified: Secondary | ICD-10-CM | POA: Diagnosis not present

## 2016-09-20 DIAGNOSIS — Z87828 Personal history of other (healed) physical injury and trauma: Secondary | ICD-10-CM | POA: Diagnosis not present

## 2016-09-27 DIAGNOSIS — F09 Unspecified mental disorder due to known physiological condition: Secondary | ICD-10-CM | POA: Diagnosis not present

## 2016-09-30 DIAGNOSIS — R000 Tachycardia, unspecified: Secondary | ICD-10-CM | POA: Diagnosis not present

## 2016-09-30 DIAGNOSIS — F989 Unspecified behavioral and emotional disorders with onset usually occurring in childhood and adolescence: Secondary | ICD-10-CM | POA: Diagnosis not present

## 2016-09-30 DIAGNOSIS — N390 Urinary tract infection, site not specified: Secondary | ICD-10-CM | POA: Diagnosis not present

## 2016-09-30 DIAGNOSIS — F0281 Dementia in other diseases classified elsewhere with behavioral disturbance: Secondary | ICD-10-CM | POA: Diagnosis not present

## 2016-09-30 DIAGNOSIS — Z87820 Personal history of traumatic brain injury: Secondary | ICD-10-CM | POA: Diagnosis not present

## 2016-09-30 DIAGNOSIS — F489 Nonpsychotic mental disorder, unspecified: Secondary | ICD-10-CM | POA: Diagnosis not present

## 2016-09-30 DIAGNOSIS — F0280 Dementia in other diseases classified elsewhere without behavioral disturbance: Secondary | ICD-10-CM | POA: Diagnosis not present

## 2016-09-30 DIAGNOSIS — Z79899 Other long term (current) drug therapy: Secondary | ICD-10-CM | POA: Diagnosis not present

## 2016-09-30 DIAGNOSIS — R451 Restlessness and agitation: Secondary | ICD-10-CM | POA: Diagnosis not present

## 2016-09-30 DIAGNOSIS — R531 Weakness: Secondary | ICD-10-CM | POA: Diagnosis not present

## 2016-09-30 DIAGNOSIS — R4182 Altered mental status, unspecified: Secondary | ICD-10-CM | POA: Diagnosis not present

## 2016-09-30 DIAGNOSIS — I4510 Unspecified right bundle-branch block: Secondary | ICD-10-CM | POA: Diagnosis not present

## 2016-10-30 DIAGNOSIS — Z0000 Encounter for general adult medical examination without abnormal findings: Secondary | ICD-10-CM | POA: Diagnosis not present

## 2016-10-30 DIAGNOSIS — M79609 Pain in unspecified limb: Secondary | ICD-10-CM | POA: Diagnosis not present

## 2016-10-30 DIAGNOSIS — Z1389 Encounter for screening for other disorder: Secondary | ICD-10-CM | POA: Diagnosis not present

## 2016-10-30 DIAGNOSIS — F909 Attention-deficit hyperactivity disorder, unspecified type: Secondary | ICD-10-CM | POA: Diagnosis not present

## 2016-10-30 DIAGNOSIS — R4701 Aphasia: Secondary | ICD-10-CM | POA: Diagnosis not present

## 2016-10-30 DIAGNOSIS — R4689 Other symptoms and signs involving appearance and behavior: Secondary | ICD-10-CM | POA: Diagnosis not present

## 2016-10-30 DIAGNOSIS — F72 Severe intellectual disabilities: Secondary | ICD-10-CM | POA: Diagnosis not present

## 2016-10-30 DIAGNOSIS — F3160 Bipolar disorder, current episode mixed, unspecified: Secondary | ICD-10-CM | POA: Diagnosis not present

## 2016-10-30 DIAGNOSIS — Z139 Encounter for screening, unspecified: Secondary | ICD-10-CM | POA: Diagnosis not present

## 2016-10-30 DIAGNOSIS — F319 Bipolar disorder, unspecified: Secondary | ICD-10-CM | POA: Diagnosis not present

## 2016-11-22 DIAGNOSIS — F09 Unspecified mental disorder due to known physiological condition: Secondary | ICD-10-CM | POA: Diagnosis not present

## 2016-11-26 DIAGNOSIS — Z79899 Other long term (current) drug therapy: Secondary | ICD-10-CM | POA: Diagnosis not present

## 2016-11-26 DIAGNOSIS — F489 Nonpsychotic mental disorder, unspecified: Secondary | ICD-10-CM | POA: Diagnosis not present

## 2016-11-26 DIAGNOSIS — T1591XA Foreign body on external eye, part unspecified, right eye, initial encounter: Secondary | ICD-10-CM | POA: Diagnosis not present

## 2016-11-26 DIAGNOSIS — H1131 Conjunctival hemorrhage, right eye: Secondary | ICD-10-CM | POA: Diagnosis not present

## 2016-11-26 DIAGNOSIS — T1501XA Foreign body in cornea, right eye, initial encounter: Secondary | ICD-10-CM | POA: Diagnosis not present

## 2016-11-28 DIAGNOSIS — R34 Anuria and oliguria: Secondary | ICD-10-CM | POA: Diagnosis not present

## 2016-11-28 DIAGNOSIS — N400 Enlarged prostate without lower urinary tract symptoms: Secondary | ICD-10-CM | POA: Diagnosis not present

## 2016-12-14 DIAGNOSIS — Z1329 Encounter for screening for other suspected endocrine disorder: Secondary | ICD-10-CM | POA: Diagnosis not present

## 2016-12-14 DIAGNOSIS — Z23 Encounter for immunization: Secondary | ICD-10-CM | POA: Diagnosis not present

## 2016-12-14 DIAGNOSIS — I10 Essential (primary) hypertension: Secondary | ICD-10-CM | POA: Diagnosis not present

## 2016-12-14 DIAGNOSIS — E119 Type 2 diabetes mellitus without complications: Secondary | ICD-10-CM | POA: Diagnosis not present

## 2016-12-14 DIAGNOSIS — L97119 Non-pressure chronic ulcer of right thigh with unspecified severity: Secondary | ICD-10-CM | POA: Diagnosis not present

## 2017-01-14 DIAGNOSIS — F99 Mental disorder, not otherwise specified: Secondary | ICD-10-CM | POA: Diagnosis not present

## 2017-01-25 DIAGNOSIS — I10 Essential (primary) hypertension: Secondary | ICD-10-CM | POA: Diagnosis not present

## 2017-01-25 DIAGNOSIS — R4689 Other symptoms and signs involving appearance and behavior: Secondary | ICD-10-CM | POA: Diagnosis not present

## 2017-01-25 DIAGNOSIS — E119 Type 2 diabetes mellitus without complications: Secondary | ICD-10-CM | POA: Diagnosis not present

## 2017-01-25 DIAGNOSIS — F72 Severe intellectual disabilities: Secondary | ICD-10-CM | POA: Diagnosis not present

## 2017-02-06 DIAGNOSIS — B351 Tinea unguium: Secondary | ICD-10-CM | POA: Diagnosis not present

## 2017-02-06 DIAGNOSIS — L602 Onychogryphosis: Secondary | ICD-10-CM | POA: Diagnosis not present

## 2017-02-14 DIAGNOSIS — F99 Mental disorder, not otherwise specified: Secondary | ICD-10-CM | POA: Diagnosis not present

## 2017-03-06 DIAGNOSIS — F99 Mental disorder, not otherwise specified: Secondary | ICD-10-CM | POA: Diagnosis not present

## 2017-03-13 DIAGNOSIS — Y999 Unspecified external cause status: Secondary | ICD-10-CM | POA: Diagnosis not present

## 2017-03-13 DIAGNOSIS — R4182 Altered mental status, unspecified: Secondary | ICD-10-CM | POA: Diagnosis not present

## 2017-03-13 DIAGNOSIS — T424X5A Adverse effect of benzodiazepines, initial encounter: Secondary | ICD-10-CM | POA: Diagnosis not present

## 2017-03-13 DIAGNOSIS — R5383 Other fatigue: Secondary | ICD-10-CM | POA: Diagnosis not present

## 2017-03-13 DIAGNOSIS — X58XXXA Exposure to other specified factors, initial encounter: Secondary | ICD-10-CM | POA: Diagnosis not present

## 2017-03-14 DIAGNOSIS — F919 Conduct disorder, unspecified: Secondary | ICD-10-CM | POA: Diagnosis not present

## 2017-03-25 DIAGNOSIS — F919 Conduct disorder, unspecified: Secondary | ICD-10-CM | POA: Diagnosis not present

## 2017-06-07 DIAGNOSIS — R220 Localized swelling, mass and lump, head: Secondary | ICD-10-CM | POA: Diagnosis not present

## 2017-06-07 DIAGNOSIS — S0512XA Contusion of eyeball and orbital tissues, left eye, initial encounter: Secondary | ICD-10-CM | POA: Diagnosis not present

## 2017-06-09 DIAGNOSIS — F250 Schizoaffective disorder, bipolar type: Secondary | ICD-10-CM | POA: Diagnosis not present

## 2017-06-09 DIAGNOSIS — T7840XA Allergy, unspecified, initial encounter: Secondary | ICD-10-CM | POA: Diagnosis not present

## 2017-06-09 DIAGNOSIS — H1033 Unspecified acute conjunctivitis, bilateral: Secondary | ICD-10-CM | POA: Diagnosis not present

## 2017-06-09 DIAGNOSIS — F73 Profound intellectual disabilities: Secondary | ICD-10-CM | POA: Diagnosis not present

## 2017-06-09 DIAGNOSIS — Z79899 Other long term (current) drug therapy: Secondary | ICD-10-CM | POA: Diagnosis not present

## 2017-06-14 DIAGNOSIS — L539 Erythematous condition, unspecified: Secondary | ICD-10-CM | POA: Diagnosis not present

## 2017-06-14 DIAGNOSIS — H1013 Acute atopic conjunctivitis, bilateral: Secondary | ICD-10-CM | POA: Diagnosis not present

## 2017-06-19 DIAGNOSIS — F919 Conduct disorder, unspecified: Secondary | ICD-10-CM | POA: Diagnosis not present

## 2017-06-26 DIAGNOSIS — R269 Unspecified abnormalities of gait and mobility: Secondary | ICD-10-CM | POA: Diagnosis not present

## 2017-06-26 DIAGNOSIS — R296 Repeated falls: Secondary | ICD-10-CM | POA: Diagnosis not present

## 2017-06-27 DIAGNOSIS — H05221 Edema of right orbit: Secondary | ICD-10-CM | POA: Diagnosis not present

## 2017-06-27 DIAGNOSIS — H5711 Ocular pain, right eye: Secondary | ICD-10-CM | POA: Diagnosis not present

## 2017-07-03 DIAGNOSIS — H05229 Edema of unspecified orbit: Secondary | ICD-10-CM | POA: Diagnosis not present

## 2017-07-03 DIAGNOSIS — R4182 Altered mental status, unspecified: Secondary | ICD-10-CM | POA: Diagnosis not present

## 2017-07-03 DIAGNOSIS — S0003XA Contusion of scalp, initial encounter: Secondary | ICD-10-CM | POA: Diagnosis not present

## 2017-07-03 DIAGNOSIS — Z6824 Body mass index (BMI) 24.0-24.9, adult: Secondary | ICD-10-CM | POA: Diagnosis not present

## 2017-07-03 DIAGNOSIS — R600 Localized edema: Secondary | ICD-10-CM | POA: Diagnosis not present

## 2017-07-03 DIAGNOSIS — R220 Localized swelling, mass and lump, head: Secondary | ICD-10-CM | POA: Diagnosis not present

## 2017-07-12 DIAGNOSIS — S0093XD Contusion of unspecified part of head, subsequent encounter: Secondary | ICD-10-CM | POA: Diagnosis not present

## 2017-07-12 DIAGNOSIS — H01119 Allergic dermatitis of unspecified eye, unspecified eyelid: Secondary | ICD-10-CM | POA: Diagnosis not present

## 2017-07-12 DIAGNOSIS — S1093XD Contusion of unspecified part of neck, subsequent encounter: Secondary | ICD-10-CM | POA: Diagnosis not present

## 2017-07-12 DIAGNOSIS — F72 Severe intellectual disabilities: Secondary | ICD-10-CM | POA: Diagnosis not present

## 2017-07-12 DIAGNOSIS — R269 Unspecified abnormalities of gait and mobility: Secondary | ICD-10-CM | POA: Diagnosis not present

## 2017-07-17 DIAGNOSIS — F919 Conduct disorder, unspecified: Secondary | ICD-10-CM | POA: Diagnosis not present

## 2017-08-19 DIAGNOSIS — F919 Conduct disorder, unspecified: Secondary | ICD-10-CM | POA: Diagnosis not present

## 2017-08-25 DIAGNOSIS — R918 Other nonspecific abnormal finding of lung field: Secondary | ICD-10-CM | POA: Diagnosis not present

## 2017-08-25 DIAGNOSIS — R680 Hypothermia, not associated with low environmental temperature: Secondary | ICD-10-CM | POA: Diagnosis not present

## 2017-08-25 DIAGNOSIS — G9341 Metabolic encephalopathy: Secondary | ICD-10-CM | POA: Diagnosis not present

## 2017-08-25 DIAGNOSIS — J156 Pneumonia due to other aerobic Gram-negative bacteria: Secondary | ICD-10-CM | POA: Diagnosis not present

## 2017-08-25 DIAGNOSIS — Z781 Physical restraint status: Secondary | ICD-10-CM | POA: Diagnosis not present

## 2017-08-25 DIAGNOSIS — A419 Sepsis, unspecified organism: Secondary | ICD-10-CM | POA: Diagnosis not present

## 2017-08-25 DIAGNOSIS — J984 Other disorders of lung: Secondary | ICD-10-CM | POA: Diagnosis not present

## 2017-08-25 DIAGNOSIS — R4182 Altered mental status, unspecified: Secondary | ICD-10-CM | POA: Diagnosis not present

## 2017-08-25 DIAGNOSIS — R531 Weakness: Secondary | ICD-10-CM | POA: Diagnosis not present

## 2017-08-25 DIAGNOSIS — E162 Hypoglycemia, unspecified: Secondary | ICD-10-CM | POA: Diagnosis not present

## 2017-08-25 DIAGNOSIS — F73 Profound intellectual disabilities: Secondary | ICD-10-CM | POA: Diagnosis not present

## 2017-08-26 DIAGNOSIS — A419 Sepsis, unspecified organism: Secondary | ICD-10-CM | POA: Diagnosis not present

## 2017-08-26 DIAGNOSIS — F259 Schizoaffective disorder, unspecified: Secondary | ICD-10-CM | POA: Diagnosis not present

## 2017-08-26 DIAGNOSIS — I10 Essential (primary) hypertension: Secondary | ICD-10-CM | POA: Diagnosis not present

## 2017-08-26 DIAGNOSIS — D696 Thrombocytopenia, unspecified: Secondary | ICD-10-CM | POA: Diagnosis not present

## 2017-08-26 DIAGNOSIS — F73 Profound intellectual disabilities: Secondary | ICD-10-CM | POA: Diagnosis present

## 2017-08-26 DIAGNOSIS — R220 Localized swelling, mass and lump, head: Secondary | ICD-10-CM | POA: Diagnosis not present

## 2017-08-26 DIAGNOSIS — J189 Pneumonia, unspecified organism: Secondary | ICD-10-CM | POA: Diagnosis not present

## 2017-08-26 DIAGNOSIS — D649 Anemia, unspecified: Secondary | ICD-10-CM | POA: Diagnosis not present

## 2017-08-26 DIAGNOSIS — F250 Schizoaffective disorder, bipolar type: Secondary | ICD-10-CM | POA: Diagnosis not present

## 2017-08-26 DIAGNOSIS — H109 Unspecified conjunctivitis: Secondary | ICD-10-CM | POA: Diagnosis not present

## 2017-08-26 DIAGNOSIS — R410 Disorientation, unspecified: Secondary | ICD-10-CM | POA: Diagnosis not present

## 2017-08-26 DIAGNOSIS — G9341 Metabolic encephalopathy: Secondary | ICD-10-CM | POA: Diagnosis present

## 2017-08-26 DIAGNOSIS — J156 Pneumonia due to other aerobic Gram-negative bacteria: Secondary | ICD-10-CM | POA: Diagnosis present

## 2017-08-26 DIAGNOSIS — Z781 Physical restraint status: Secondary | ICD-10-CM | POA: Diagnosis not present

## 2017-08-26 DIAGNOSIS — S0003XA Contusion of scalp, initial encounter: Secondary | ICD-10-CM | POA: Diagnosis present

## 2017-08-26 DIAGNOSIS — J984 Other disorders of lung: Secondary | ICD-10-CM | POA: Diagnosis not present

## 2017-08-26 DIAGNOSIS — E876 Hypokalemia: Secondary | ICD-10-CM | POA: Diagnosis not present

## 2017-08-26 DIAGNOSIS — Z79899 Other long term (current) drug therapy: Secondary | ICD-10-CM | POA: Diagnosis not present

## 2017-08-26 DIAGNOSIS — F319 Bipolar disorder, unspecified: Secondary | ICD-10-CM | POA: Diagnosis not present

## 2017-08-26 DIAGNOSIS — R4182 Altered mental status, unspecified: Secondary | ICD-10-CM | POA: Diagnosis not present

## 2017-08-26 DIAGNOSIS — E162 Hypoglycemia, unspecified: Secondary | ICD-10-CM | POA: Diagnosis not present

## 2017-08-26 DIAGNOSIS — M2550 Pain in unspecified joint: Secondary | ICD-10-CM | POA: Diagnosis not present

## 2017-08-26 DIAGNOSIS — S069X9A Unspecified intracranial injury with loss of consciousness of unspecified duration, initial encounter: Secondary | ICD-10-CM | POA: Diagnosis not present

## 2017-08-26 DIAGNOSIS — Z7401 Bed confinement status: Secondary | ICD-10-CM | POA: Diagnosis not present

## 2017-08-26 DIAGNOSIS — T68XXXD Hypothermia, subsequent encounter: Secondary | ICD-10-CM | POA: Diagnosis not present

## 2017-08-26 DIAGNOSIS — S069X9S Unspecified intracranial injury with loss of consciousness of unspecified duration, sequela: Secondary | ICD-10-CM | POA: Diagnosis not present

## 2017-08-26 DIAGNOSIS — R680 Hypothermia, not associated with low environmental temperature: Secondary | ICD-10-CM | POA: Diagnosis present

## 2017-08-26 DIAGNOSIS — R918 Other nonspecific abnormal finding of lung field: Secondary | ICD-10-CM | POA: Diagnosis not present

## 2017-08-26 DIAGNOSIS — Z87820 Personal history of traumatic brain injury: Secondary | ICD-10-CM | POA: Diagnosis not present

## 2017-08-26 DIAGNOSIS — G9340 Encephalopathy, unspecified: Secondary | ICD-10-CM | POA: Diagnosis not present

## 2017-08-26 DIAGNOSIS — J181 Lobar pneumonia, unspecified organism: Secondary | ICD-10-CM | POA: Diagnosis not present

## 2017-08-26 DIAGNOSIS — T68XXXA Hypothermia, initial encounter: Secondary | ICD-10-CM | POA: Diagnosis not present

## 2017-09-10 DIAGNOSIS — J159 Unspecified bacterial pneumonia: Secondary | ICD-10-CM | POA: Diagnosis not present

## 2017-09-10 DIAGNOSIS — F250 Schizoaffective disorder, bipolar type: Secondary | ICD-10-CM | POA: Diagnosis not present

## 2017-09-10 DIAGNOSIS — R220 Localized swelling, mass and lump, head: Secondary | ICD-10-CM | POA: Diagnosis not present

## 2017-09-10 DIAGNOSIS — F72 Severe intellectual disabilities: Secondary | ICD-10-CM | POA: Diagnosis not present

## 2017-09-10 DIAGNOSIS — S06309D Unspecified focal traumatic brain injury with loss of consciousness of unspecified duration, subsequent encounter: Secondary | ICD-10-CM | POA: Diagnosis not present

## 2017-09-10 DIAGNOSIS — D649 Anemia, unspecified: Secondary | ICD-10-CM | POA: Diagnosis not present

## 2017-09-13 DIAGNOSIS — F72 Severe intellectual disabilities: Secondary | ICD-10-CM | POA: Diagnosis not present

## 2017-09-13 DIAGNOSIS — F250 Schizoaffective disorder, bipolar type: Secondary | ICD-10-CM | POA: Diagnosis not present

## 2017-09-13 DIAGNOSIS — D649 Anemia, unspecified: Secondary | ICD-10-CM | POA: Diagnosis not present

## 2017-09-13 DIAGNOSIS — J159 Unspecified bacterial pneumonia: Secondary | ICD-10-CM | POA: Diagnosis not present

## 2017-09-13 DIAGNOSIS — R220 Localized swelling, mass and lump, head: Secondary | ICD-10-CM | POA: Diagnosis not present

## 2017-09-13 DIAGNOSIS — S06309D Unspecified focal traumatic brain injury with loss of consciousness of unspecified duration, subsequent encounter: Secondary | ICD-10-CM | POA: Diagnosis not present

## 2017-09-16 DIAGNOSIS — M7989 Other specified soft tissue disorders: Secondary | ICD-10-CM | POA: Diagnosis not present

## 2017-09-16 DIAGNOSIS — S069X9A Unspecified intracranial injury with loss of consciousness of unspecified duration, initial encounter: Secondary | ICD-10-CM | POA: Diagnosis not present

## 2017-09-16 DIAGNOSIS — R32 Unspecified urinary incontinence: Secondary | ICD-10-CM | POA: Diagnosis not present

## 2017-09-16 DIAGNOSIS — F72 Severe intellectual disabilities: Secondary | ICD-10-CM | POA: Diagnosis not present

## 2017-09-16 DIAGNOSIS — B309 Viral conjunctivitis, unspecified: Secondary | ICD-10-CM | POA: Diagnosis not present

## 2017-09-16 DIAGNOSIS — A419 Sepsis, unspecified organism: Secondary | ICD-10-CM | POA: Diagnosis not present

## 2017-09-16 DIAGNOSIS — R2689 Other abnormalities of gait and mobility: Secondary | ICD-10-CM | POA: Diagnosis not present

## 2017-09-16 DIAGNOSIS — R220 Localized swelling, mass and lump, head: Secondary | ICD-10-CM | POA: Diagnosis not present

## 2017-09-16 DIAGNOSIS — G9340 Encephalopathy, unspecified: Secondary | ICD-10-CM | POA: Diagnosis not present

## 2017-09-18 DIAGNOSIS — S069X9A Unspecified intracranial injury with loss of consciousness of unspecified duration, initial encounter: Secondary | ICD-10-CM | POA: Diagnosis not present

## 2017-09-19 DIAGNOSIS — R220 Localized swelling, mass and lump, head: Secondary | ICD-10-CM | POA: Diagnosis not present

## 2017-09-19 DIAGNOSIS — D649 Anemia, unspecified: Secondary | ICD-10-CM | POA: Diagnosis not present

## 2017-09-19 DIAGNOSIS — F72 Severe intellectual disabilities: Secondary | ICD-10-CM | POA: Diagnosis not present

## 2017-09-19 DIAGNOSIS — J159 Unspecified bacterial pneumonia: Secondary | ICD-10-CM | POA: Diagnosis not present

## 2017-09-19 DIAGNOSIS — F250 Schizoaffective disorder, bipolar type: Secondary | ICD-10-CM | POA: Diagnosis not present

## 2017-09-19 DIAGNOSIS — S06309D Unspecified focal traumatic brain injury with loss of consciousness of unspecified duration, subsequent encounter: Secondary | ICD-10-CM | POA: Diagnosis not present

## 2017-09-20 DIAGNOSIS — D649 Anemia, unspecified: Secondary | ICD-10-CM | POA: Diagnosis not present

## 2017-09-20 DIAGNOSIS — R220 Localized swelling, mass and lump, head: Secondary | ICD-10-CM | POA: Diagnosis not present

## 2017-09-20 DIAGNOSIS — S06309D Unspecified focal traumatic brain injury with loss of consciousness of unspecified duration, subsequent encounter: Secondary | ICD-10-CM | POA: Diagnosis not present

## 2017-09-20 DIAGNOSIS — F72 Severe intellectual disabilities: Secondary | ICD-10-CM | POA: Diagnosis not present

## 2017-09-20 DIAGNOSIS — J159 Unspecified bacterial pneumonia: Secondary | ICD-10-CM | POA: Diagnosis not present

## 2017-09-20 DIAGNOSIS — F250 Schizoaffective disorder, bipolar type: Secondary | ICD-10-CM | POA: Diagnosis not present

## 2017-09-24 DIAGNOSIS — S06309D Unspecified focal traumatic brain injury with loss of consciousness of unspecified duration, subsequent encounter: Secondary | ICD-10-CM | POA: Diagnosis not present

## 2017-09-24 DIAGNOSIS — J159 Unspecified bacterial pneumonia: Secondary | ICD-10-CM | POA: Diagnosis not present

## 2017-09-24 DIAGNOSIS — R220 Localized swelling, mass and lump, head: Secondary | ICD-10-CM | POA: Diagnosis not present

## 2017-09-24 DIAGNOSIS — F250 Schizoaffective disorder, bipolar type: Secondary | ICD-10-CM | POA: Diagnosis not present

## 2017-09-24 DIAGNOSIS — D649 Anemia, unspecified: Secondary | ICD-10-CM | POA: Diagnosis not present

## 2017-09-24 DIAGNOSIS — F72 Severe intellectual disabilities: Secondary | ICD-10-CM | POA: Diagnosis not present

## 2017-09-26 DIAGNOSIS — J159 Unspecified bacterial pneumonia: Secondary | ICD-10-CM | POA: Diagnosis not present

## 2017-09-26 DIAGNOSIS — F250 Schizoaffective disorder, bipolar type: Secondary | ICD-10-CM | POA: Diagnosis not present

## 2017-09-26 DIAGNOSIS — R220 Localized swelling, mass and lump, head: Secondary | ICD-10-CM | POA: Diagnosis not present

## 2017-09-26 DIAGNOSIS — D649 Anemia, unspecified: Secondary | ICD-10-CM | POA: Diagnosis not present

## 2017-09-26 DIAGNOSIS — F72 Severe intellectual disabilities: Secondary | ICD-10-CM | POA: Diagnosis not present

## 2017-09-26 DIAGNOSIS — S06309D Unspecified focal traumatic brain injury with loss of consciousness of unspecified duration, subsequent encounter: Secondary | ICD-10-CM | POA: Diagnosis not present

## 2017-09-30 DIAGNOSIS — F919 Conduct disorder, unspecified: Secondary | ICD-10-CM | POA: Diagnosis not present

## 2017-10-01 DIAGNOSIS — F250 Schizoaffective disorder, bipolar type: Secondary | ICD-10-CM | POA: Diagnosis not present

## 2017-10-01 DIAGNOSIS — R220 Localized swelling, mass and lump, head: Secondary | ICD-10-CM | POA: Diagnosis not present

## 2017-10-01 DIAGNOSIS — S06309D Unspecified focal traumatic brain injury with loss of consciousness of unspecified duration, subsequent encounter: Secondary | ICD-10-CM | POA: Diagnosis not present

## 2017-10-01 DIAGNOSIS — J159 Unspecified bacterial pneumonia: Secondary | ICD-10-CM | POA: Diagnosis not present

## 2017-10-01 DIAGNOSIS — D649 Anemia, unspecified: Secondary | ICD-10-CM | POA: Diagnosis not present

## 2017-10-01 DIAGNOSIS — F72 Severe intellectual disabilities: Secondary | ICD-10-CM | POA: Diagnosis not present

## 2017-10-02 DIAGNOSIS — R220 Localized swelling, mass and lump, head: Secondary | ICD-10-CM | POA: Diagnosis not present

## 2017-10-02 DIAGNOSIS — J159 Unspecified bacterial pneumonia: Secondary | ICD-10-CM | POA: Diagnosis not present

## 2017-10-02 DIAGNOSIS — D649 Anemia, unspecified: Secondary | ICD-10-CM | POA: Diagnosis not present

## 2017-10-02 DIAGNOSIS — S06309D Unspecified focal traumatic brain injury with loss of consciousness of unspecified duration, subsequent encounter: Secondary | ICD-10-CM | POA: Diagnosis not present

## 2017-10-02 DIAGNOSIS — F250 Schizoaffective disorder, bipolar type: Secondary | ICD-10-CM | POA: Diagnosis not present

## 2017-10-02 DIAGNOSIS — F72 Severe intellectual disabilities: Secondary | ICD-10-CM | POA: Diagnosis not present

## 2017-10-03 DIAGNOSIS — D649 Anemia, unspecified: Secondary | ICD-10-CM | POA: Diagnosis not present

## 2017-10-03 DIAGNOSIS — R220 Localized swelling, mass and lump, head: Secondary | ICD-10-CM | POA: Diagnosis not present

## 2017-10-03 DIAGNOSIS — J159 Unspecified bacterial pneumonia: Secondary | ICD-10-CM | POA: Diagnosis not present

## 2017-10-03 DIAGNOSIS — S06309D Unspecified focal traumatic brain injury with loss of consciousness of unspecified duration, subsequent encounter: Secondary | ICD-10-CM | POA: Diagnosis not present

## 2017-10-03 DIAGNOSIS — F250 Schizoaffective disorder, bipolar type: Secondary | ICD-10-CM | POA: Diagnosis not present

## 2017-10-03 DIAGNOSIS — F72 Severe intellectual disabilities: Secondary | ICD-10-CM | POA: Diagnosis not present

## 2017-10-04 DIAGNOSIS — F250 Schizoaffective disorder, bipolar type: Secondary | ICD-10-CM | POA: Diagnosis not present

## 2017-10-04 DIAGNOSIS — S06309D Unspecified focal traumatic brain injury with loss of consciousness of unspecified duration, subsequent encounter: Secondary | ICD-10-CM | POA: Diagnosis not present

## 2017-10-04 DIAGNOSIS — F72 Severe intellectual disabilities: Secondary | ICD-10-CM | POA: Diagnosis not present

## 2017-10-04 DIAGNOSIS — D649 Anemia, unspecified: Secondary | ICD-10-CM | POA: Diagnosis not present

## 2017-10-04 DIAGNOSIS — R220 Localized swelling, mass and lump, head: Secondary | ICD-10-CM | POA: Diagnosis not present

## 2017-10-04 DIAGNOSIS — J159 Unspecified bacterial pneumonia: Secondary | ICD-10-CM | POA: Diagnosis not present

## 2017-10-07 DIAGNOSIS — S06309D Unspecified focal traumatic brain injury with loss of consciousness of unspecified duration, subsequent encounter: Secondary | ICD-10-CM | POA: Diagnosis not present

## 2017-10-07 DIAGNOSIS — J159 Unspecified bacterial pneumonia: Secondary | ICD-10-CM | POA: Diagnosis not present

## 2017-10-07 DIAGNOSIS — F72 Severe intellectual disabilities: Secondary | ICD-10-CM | POA: Diagnosis not present

## 2017-10-07 DIAGNOSIS — R220 Localized swelling, mass and lump, head: Secondary | ICD-10-CM | POA: Diagnosis not present

## 2017-10-07 DIAGNOSIS — F250 Schizoaffective disorder, bipolar type: Secondary | ICD-10-CM | POA: Diagnosis not present

## 2017-10-07 DIAGNOSIS — D649 Anemia, unspecified: Secondary | ICD-10-CM | POA: Diagnosis not present

## 2017-10-08 DIAGNOSIS — F250 Schizoaffective disorder, bipolar type: Secondary | ICD-10-CM | POA: Diagnosis not present

## 2017-10-08 DIAGNOSIS — F72 Severe intellectual disabilities: Secondary | ICD-10-CM | POA: Diagnosis not present

## 2017-10-08 DIAGNOSIS — J159 Unspecified bacterial pneumonia: Secondary | ICD-10-CM | POA: Diagnosis not present

## 2017-10-08 DIAGNOSIS — R220 Localized swelling, mass and lump, head: Secondary | ICD-10-CM | POA: Diagnosis not present

## 2017-10-08 DIAGNOSIS — D649 Anemia, unspecified: Secondary | ICD-10-CM | POA: Diagnosis not present

## 2017-10-08 DIAGNOSIS — S06309D Unspecified focal traumatic brain injury with loss of consciousness of unspecified duration, subsequent encounter: Secondary | ICD-10-CM | POA: Diagnosis not present

## 2017-10-10 DIAGNOSIS — R269 Unspecified abnormalities of gait and mobility: Secondary | ICD-10-CM | POA: Diagnosis not present

## 2017-10-10 DIAGNOSIS — Z5181 Encounter for therapeutic drug level monitoring: Secondary | ICD-10-CM | POA: Diagnosis not present

## 2017-10-15 DIAGNOSIS — H25813 Combined forms of age-related cataract, bilateral: Secondary | ICD-10-CM | POA: Diagnosis not present

## 2017-10-17 DIAGNOSIS — D649 Anemia, unspecified: Secondary | ICD-10-CM | POA: Diagnosis not present

## 2017-10-17 DIAGNOSIS — S06309D Unspecified focal traumatic brain injury with loss of consciousness of unspecified duration, subsequent encounter: Secondary | ICD-10-CM | POA: Diagnosis not present

## 2017-10-17 DIAGNOSIS — J159 Unspecified bacterial pneumonia: Secondary | ICD-10-CM | POA: Diagnosis not present

## 2017-10-17 DIAGNOSIS — F72 Severe intellectual disabilities: Secondary | ICD-10-CM | POA: Diagnosis not present

## 2017-10-17 DIAGNOSIS — F250 Schizoaffective disorder, bipolar type: Secondary | ICD-10-CM | POA: Diagnosis not present

## 2017-10-17 DIAGNOSIS — R220 Localized swelling, mass and lump, head: Secondary | ICD-10-CM | POA: Diagnosis not present

## 2017-10-25 DIAGNOSIS — Z79899 Other long term (current) drug therapy: Secondary | ICD-10-CM | POA: Diagnosis not present

## 2017-10-25 DIAGNOSIS — G8911 Acute pain due to trauma: Secondary | ICD-10-CM | POA: Diagnosis not present

## 2017-10-25 DIAGNOSIS — S62522B Displaced fracture of distal phalanx of left thumb, initial encounter for open fracture: Secondary | ICD-10-CM | POA: Diagnosis not present

## 2017-10-25 DIAGNOSIS — F251 Schizoaffective disorder, depressive type: Secondary | ICD-10-CM | POA: Diagnosis not present

## 2017-10-25 DIAGNOSIS — S6702XA Crushing injury of left thumb, initial encounter: Secondary | ICD-10-CM | POA: Diagnosis not present

## 2017-10-25 DIAGNOSIS — F73 Profound intellectual disabilities: Secondary | ICD-10-CM | POA: Diagnosis not present

## 2017-10-25 DIAGNOSIS — Z87820 Personal history of traumatic brain injury: Secondary | ICD-10-CM | POA: Diagnosis not present

## 2017-10-25 DIAGNOSIS — S6992XA Unspecified injury of left wrist, hand and finger(s), initial encounter: Secondary | ICD-10-CM | POA: Diagnosis not present

## 2017-10-25 DIAGNOSIS — F319 Bipolar disorder, unspecified: Secondary | ICD-10-CM | POA: Diagnosis not present

## 2017-10-28 DIAGNOSIS — S62522B Displaced fracture of distal phalanx of left thumb, initial encounter for open fracture: Secondary | ICD-10-CM | POA: Diagnosis not present

## 2017-11-04 DIAGNOSIS — S62522B Displaced fracture of distal phalanx of left thumb, initial encounter for open fracture: Secondary | ICD-10-CM | POA: Diagnosis not present

## 2017-11-20 DIAGNOSIS — H268 Other specified cataract: Secondary | ICD-10-CM | POA: Diagnosis not present

## 2017-11-20 DIAGNOSIS — F259 Schizoaffective disorder, unspecified: Secondary | ICD-10-CM | POA: Diagnosis not present

## 2017-11-20 DIAGNOSIS — H278 Other specified disorders of lens: Secondary | ICD-10-CM | POA: Diagnosis not present

## 2017-11-20 DIAGNOSIS — H26131 Total traumatic cataract, right eye: Secondary | ICD-10-CM | POA: Diagnosis not present

## 2017-11-20 DIAGNOSIS — Z87820 Personal history of traumatic brain injury: Secondary | ICD-10-CM | POA: Diagnosis not present

## 2017-11-20 DIAGNOSIS — H25813 Combined forms of age-related cataract, bilateral: Secondary | ICD-10-CM | POA: Diagnosis not present

## 2017-11-20 DIAGNOSIS — H269 Unspecified cataract: Secondary | ICD-10-CM | POA: Diagnosis not present

## 2017-11-20 DIAGNOSIS — H2511 Age-related nuclear cataract, right eye: Secondary | ICD-10-CM | POA: Diagnosis not present

## 2017-11-20 DIAGNOSIS — K219 Gastro-esophageal reflux disease without esophagitis: Secondary | ICD-10-CM | POA: Diagnosis not present

## 2017-11-21 DIAGNOSIS — H2512 Age-related nuclear cataract, left eye: Secondary | ICD-10-CM | POA: Diagnosis not present

## 2017-11-21 DIAGNOSIS — H02843 Edema of right eye, unspecified eyelid: Secondary | ICD-10-CM | POA: Diagnosis not present

## 2017-11-21 DIAGNOSIS — Z961 Presence of intraocular lens: Secondary | ICD-10-CM | POA: Diagnosis not present

## 2017-11-21 DIAGNOSIS — Z48810 Encounter for surgical aftercare following surgery on the sense organs: Secondary | ICD-10-CM | POA: Diagnosis not present

## 2017-12-02 DIAGNOSIS — S62522B Displaced fracture of distal phalanx of left thumb, initial encounter for open fracture: Secondary | ICD-10-CM | POA: Diagnosis not present

## 2017-12-03 DIAGNOSIS — H5704 Mydriasis: Secondary | ICD-10-CM | POA: Diagnosis not present

## 2017-12-03 DIAGNOSIS — Z9841 Cataract extraction status, right eye: Secondary | ICD-10-CM | POA: Diagnosis not present

## 2017-12-03 DIAGNOSIS — Z48810 Encounter for surgical aftercare following surgery on the sense organs: Secondary | ICD-10-CM | POA: Diagnosis not present

## 2017-12-03 DIAGNOSIS — H2101 Hyphema, right eye: Secondary | ICD-10-CM | POA: Diagnosis not present

## 2017-12-04 DIAGNOSIS — H21531 Iridodialysis, right eye: Secondary | ICD-10-CM | POA: Diagnosis not present

## 2017-12-04 DIAGNOSIS — S0521XA Ocular laceration and rupture with prolapse or loss of intraocular tissue, right eye, initial encounter: Secondary | ICD-10-CM | POA: Diagnosis not present

## 2017-12-04 DIAGNOSIS — Z9841 Cataract extraction status, right eye: Secondary | ICD-10-CM | POA: Diagnosis not present

## 2017-12-04 DIAGNOSIS — H2101 Hyphema, right eye: Secondary | ICD-10-CM | POA: Diagnosis not present

## 2017-12-04 DIAGNOSIS — T8529XA Other mechanical complication of intraocular lens, initial encounter: Secondary | ICD-10-CM | POA: Diagnosis not present

## 2017-12-04 DIAGNOSIS — Z961 Presence of intraocular lens: Secondary | ICD-10-CM | POA: Diagnosis not present

## 2017-12-05 DIAGNOSIS — Z48810 Encounter for surgical aftercare following surgery on the sense organs: Secondary | ICD-10-CM | POA: Diagnosis not present

## 2017-12-05 DIAGNOSIS — S0521XD Ocular laceration and rupture with prolapse or loss of intraocular tissue, right eye, subsequent encounter: Secondary | ICD-10-CM | POA: Diagnosis not present

## 2017-12-12 DIAGNOSIS — H2701 Aphakia, right eye: Secondary | ICD-10-CM | POA: Diagnosis not present

## 2017-12-12 DIAGNOSIS — S0521XD Ocular laceration and rupture with prolapse or loss of intraocular tissue, right eye, subsequent encounter: Secondary | ICD-10-CM | POA: Diagnosis not present

## 2017-12-12 DIAGNOSIS — H2512 Age-related nuclear cataract, left eye: Secondary | ICD-10-CM | POA: Diagnosis not present

## 2017-12-12 DIAGNOSIS — Z48810 Encounter for surgical aftercare following surgery on the sense organs: Secondary | ICD-10-CM | POA: Diagnosis not present

## 2017-12-12 DIAGNOSIS — H2101 Hyphema, right eye: Secondary | ICD-10-CM | POA: Diagnosis not present

## 2017-12-18 DIAGNOSIS — H2103 Hyphema, bilateral: Secondary | ICD-10-CM | POA: Diagnosis not present

## 2017-12-18 DIAGNOSIS — H5704 Mydriasis: Secondary | ICD-10-CM | POA: Diagnosis not present

## 2017-12-18 DIAGNOSIS — Z48810 Encounter for surgical aftercare following surgery on the sense organs: Secondary | ICD-10-CM | POA: Diagnosis not present

## 2017-12-18 DIAGNOSIS — Z79899 Other long term (current) drug therapy: Secondary | ICD-10-CM | POA: Diagnosis not present

## 2017-12-18 DIAGNOSIS — H269 Unspecified cataract: Secondary | ICD-10-CM | POA: Diagnosis not present

## 2017-12-18 DIAGNOSIS — Z961 Presence of intraocular lens: Secondary | ICD-10-CM | POA: Diagnosis not present

## 2017-12-27 DIAGNOSIS — S0521XD Ocular laceration and rupture with prolapse or loss of intraocular tissue, right eye, subsequent encounter: Secondary | ICD-10-CM | POA: Diagnosis not present

## 2017-12-27 DIAGNOSIS — Z961 Presence of intraocular lens: Secondary | ICD-10-CM | POA: Diagnosis not present

## 2017-12-27 DIAGNOSIS — Z48810 Encounter for surgical aftercare following surgery on the sense organs: Secondary | ICD-10-CM | POA: Diagnosis not present

## 2017-12-27 DIAGNOSIS — H5704 Mydriasis: Secondary | ICD-10-CM | POA: Diagnosis not present

## 2017-12-27 DIAGNOSIS — H2103 Hyphema, bilateral: Secondary | ICD-10-CM | POA: Diagnosis not present

## 2017-12-27 DIAGNOSIS — H269 Unspecified cataract: Secondary | ICD-10-CM | POA: Diagnosis not present

## 2017-12-27 DIAGNOSIS — Z79899 Other long term (current) drug therapy: Secondary | ICD-10-CM | POA: Diagnosis not present

## 2017-12-30 DIAGNOSIS — F919 Conduct disorder, unspecified: Secondary | ICD-10-CM | POA: Diagnosis not present

## 2017-12-31 DIAGNOSIS — H2100 Hyphema, unspecified eye: Secondary | ICD-10-CM | POA: Diagnosis not present

## 2017-12-31 DIAGNOSIS — Z8639 Personal history of other endocrine, nutritional and metabolic disease: Secondary | ICD-10-CM | POA: Diagnosis not present

## 2017-12-31 DIAGNOSIS — H538 Other visual disturbances: Secondary | ICD-10-CM | POA: Diagnosis not present

## 2017-12-31 DIAGNOSIS — H269 Unspecified cataract: Secondary | ICD-10-CM | POA: Diagnosis not present

## 2018-01-03 DIAGNOSIS — H2103 Hyphema, bilateral: Secondary | ICD-10-CM | POA: Diagnosis not present

## 2018-01-03 DIAGNOSIS — E119 Type 2 diabetes mellitus without complications: Secondary | ICD-10-CM | POA: Diagnosis not present

## 2018-01-03 DIAGNOSIS — H15091 Other scleritis, right eye: Secondary | ICD-10-CM | POA: Diagnosis not present

## 2018-01-03 DIAGNOSIS — M795 Residual foreign body in soft tissue: Secondary | ICD-10-CM | POA: Diagnosis not present

## 2018-01-03 DIAGNOSIS — H4313 Vitreous hemorrhage, bilateral: Secondary | ICD-10-CM | POA: Diagnosis not present

## 2018-01-04 DIAGNOSIS — E119 Type 2 diabetes mellitus without complications: Secondary | ICD-10-CM | POA: Diagnosis not present

## 2018-01-04 DIAGNOSIS — R9431 Abnormal electrocardiogram [ECG] [EKG]: Secondary | ICD-10-CM | POA: Diagnosis not present

## 2018-01-04 DIAGNOSIS — H4313 Vitreous hemorrhage, bilateral: Secondary | ICD-10-CM | POA: Diagnosis not present

## 2018-01-04 DIAGNOSIS — H2103 Hyphema, bilateral: Secondary | ICD-10-CM | POA: Diagnosis not present

## 2018-01-04 DIAGNOSIS — M795 Residual foreign body in soft tissue: Secondary | ICD-10-CM | POA: Diagnosis not present

## 2018-01-04 DIAGNOSIS — I491 Atrial premature depolarization: Secondary | ICD-10-CM | POA: Diagnosis not present

## 2018-01-04 DIAGNOSIS — I4510 Unspecified right bundle-branch block: Secondary | ICD-10-CM | POA: Diagnosis not present

## 2018-01-28 DIAGNOSIS — H4313 Vitreous hemorrhage, bilateral: Secondary | ICD-10-CM | POA: Diagnosis not present

## 2018-01-28 DIAGNOSIS — Z9889 Other specified postprocedural states: Secondary | ICD-10-CM | POA: Diagnosis not present

## 2018-01-28 DIAGNOSIS — Z79899 Other long term (current) drug therapy: Secondary | ICD-10-CM | POA: Diagnosis not present

## 2018-01-28 DIAGNOSIS — H2103 Hyphema, bilateral: Secondary | ICD-10-CM | POA: Diagnosis not present

## 2018-01-28 DIAGNOSIS — S0521XD Ocular laceration and rupture with prolapse or loss of intraocular tissue, right eye, subsequent encounter: Secondary | ICD-10-CM | POA: Diagnosis not present

## 2018-01-31 DIAGNOSIS — R197 Diarrhea, unspecified: Secondary | ICD-10-CM | POA: Diagnosis not present

## 2018-01-31 DIAGNOSIS — M7989 Other specified soft tissue disorders: Secondary | ICD-10-CM | POA: Diagnosis not present

## 2018-02-11 DIAGNOSIS — S0521XD Ocular laceration and rupture with prolapse or loss of intraocular tissue, right eye, subsequent encounter: Secondary | ICD-10-CM | POA: Diagnosis not present

## 2018-02-11 DIAGNOSIS — Z8669 Personal history of other diseases of the nervous system and sense organs: Secondary | ICD-10-CM | POA: Diagnosis not present

## 2018-02-11 DIAGNOSIS — Z9841 Cataract extraction status, right eye: Secondary | ICD-10-CM | POA: Diagnosis not present

## 2018-02-11 DIAGNOSIS — Z79899 Other long term (current) drug therapy: Secondary | ICD-10-CM | POA: Diagnosis not present

## 2018-02-11 DIAGNOSIS — H4313 Vitreous hemorrhage, bilateral: Secondary | ICD-10-CM | POA: Diagnosis not present

## 2018-02-12 DIAGNOSIS — G3184 Mild cognitive impairment, so stated: Secondary | ICD-10-CM | POA: Diagnosis present

## 2018-02-12 DIAGNOSIS — Z7189 Other specified counseling: Secondary | ICD-10-CM | POA: Diagnosis not present

## 2018-02-12 DIAGNOSIS — Z4682 Encounter for fitting and adjustment of non-vascular catheter: Secondary | ICD-10-CM | POA: Diagnosis not present

## 2018-02-12 DIAGNOSIS — J189 Pneumonia, unspecified organism: Secondary | ICD-10-CM | POA: Diagnosis not present

## 2018-02-12 DIAGNOSIS — I639 Cerebral infarction, unspecified: Secondary | ICD-10-CM | POA: Diagnosis not present

## 2018-02-12 DIAGNOSIS — E870 Hyperosmolality and hypernatremia: Secondary | ICD-10-CM | POA: Diagnosis not present

## 2018-02-12 DIAGNOSIS — G9340 Encephalopathy, unspecified: Secondary | ICD-10-CM | POA: Diagnosis not present

## 2018-02-12 DIAGNOSIS — E872 Acidosis: Secondary | ICD-10-CM | POA: Diagnosis not present

## 2018-02-12 DIAGNOSIS — R918 Other nonspecific abnormal finding of lung field: Secondary | ICD-10-CM | POA: Diagnosis not present

## 2018-02-12 DIAGNOSIS — J9811 Atelectasis: Secondary | ICD-10-CM | POA: Diagnosis not present

## 2018-02-12 DIAGNOSIS — J90 Pleural effusion, not elsewhere classified: Secondary | ICD-10-CM | POA: Diagnosis not present

## 2018-02-12 DIAGNOSIS — Z79899 Other long term (current) drug therapy: Secondary | ICD-10-CM | POA: Diagnosis not present

## 2018-02-12 DIAGNOSIS — R531 Weakness: Secondary | ICD-10-CM | POA: Diagnosis not present

## 2018-02-12 DIAGNOSIS — E876 Hypokalemia: Secondary | ICD-10-CM | POA: Diagnosis not present

## 2018-02-12 DIAGNOSIS — D696 Thrombocytopenia, unspecified: Secondary | ICD-10-CM | POA: Diagnosis present

## 2018-02-12 DIAGNOSIS — R680 Hypothermia, not associated with low environmental temperature: Secondary | ICD-10-CM | POA: Diagnosis not present

## 2018-02-12 DIAGNOSIS — M7981 Nontraumatic hematoma of soft tissue: Secondary | ICD-10-CM | POA: Diagnosis present

## 2018-02-12 DIAGNOSIS — A419 Sepsis, unspecified organism: Secondary | ICD-10-CM | POA: Diagnosis not present

## 2018-02-12 DIAGNOSIS — Z66 Do not resuscitate: Secondary | ICD-10-CM | POA: Diagnosis not present

## 2018-02-12 DIAGNOSIS — Z515 Encounter for palliative care: Secondary | ICD-10-CM | POA: Diagnosis not present

## 2018-02-12 DIAGNOSIS — R6521 Severe sepsis with septic shock: Secondary | ICD-10-CM | POA: Diagnosis not present

## 2018-02-12 DIAGNOSIS — R4182 Altered mental status, unspecified: Secondary | ICD-10-CM | POA: Diagnosis not present

## 2018-02-12 DIAGNOSIS — Z743 Need for continuous supervision: Secondary | ICD-10-CM | POA: Diagnosis not present

## 2018-02-12 DIAGNOSIS — E43 Unspecified severe protein-calorie malnutrition: Secondary | ICD-10-CM | POA: Diagnosis not present

## 2018-02-12 DIAGNOSIS — J690 Pneumonitis due to inhalation of food and vomit: Secondary | ICD-10-CM | POA: Diagnosis not present

## 2018-02-12 DIAGNOSIS — N179 Acute kidney failure, unspecified: Secondary | ICD-10-CM | POA: Diagnosis not present

## 2018-02-12 DIAGNOSIS — R456 Violent behavior: Secondary | ICD-10-CM | POA: Diagnosis not present

## 2018-02-12 DIAGNOSIS — N170 Acute kidney failure with tubular necrosis: Secondary | ICD-10-CM | POA: Diagnosis not present

## 2018-02-12 DIAGNOSIS — E871 Hypo-osmolality and hyponatremia: Secondary | ICD-10-CM | POA: Diagnosis not present

## 2018-02-12 DIAGNOSIS — R579 Shock, unspecified: Secondary | ICD-10-CM | POA: Diagnosis not present

## 2018-02-12 DIAGNOSIS — J9601 Acute respiratory failure with hypoxia: Secondary | ICD-10-CM | POA: Diagnosis not present

## 2018-02-12 DIAGNOSIS — E861 Hypovolemia: Secondary | ICD-10-CM | POA: Diagnosis present

## 2018-02-12 DIAGNOSIS — J9600 Acute respiratory failure, unspecified whether with hypoxia or hypercapnia: Secondary | ICD-10-CM | POA: Diagnosis not present

## 2018-02-12 DIAGNOSIS — R001 Bradycardia, unspecified: Secondary | ICD-10-CM | POA: Diagnosis not present

## 2018-02-12 DIAGNOSIS — I959 Hypotension, unspecified: Secondary | ICD-10-CM | POA: Diagnosis not present

## 2018-02-12 DIAGNOSIS — Z452 Encounter for adjustment and management of vascular access device: Secondary | ICD-10-CM | POA: Diagnosis not present

## 2018-02-12 DIAGNOSIS — A4189 Other specified sepsis: Secondary | ICD-10-CM | POA: Diagnosis not present

## 2018-02-12 DIAGNOSIS — F319 Bipolar disorder, unspecified: Secondary | ICD-10-CM | POA: Diagnosis not present

## 2018-02-12 DIAGNOSIS — E2740 Unspecified adrenocortical insufficiency: Secondary | ICD-10-CM | POA: Diagnosis not present

## 2018-02-12 DIAGNOSIS — S0003XD Contusion of scalp, subsequent encounter: Secondary | ICD-10-CM | POA: Diagnosis not present

## 2018-02-12 DIAGNOSIS — R279 Unspecified lack of coordination: Secondary | ICD-10-CM | POA: Diagnosis not present

## 2018-02-12 DIAGNOSIS — I517 Cardiomegaly: Secondary | ICD-10-CM | POA: Diagnosis not present

## 2018-02-12 DIAGNOSIS — Z87820 Personal history of traumatic brain injury: Secondary | ICD-10-CM | POA: Diagnosis not present

## 2018-02-12 DIAGNOSIS — T68XXXA Hypothermia, initial encounter: Secondary | ICD-10-CM | POA: Diagnosis not present

## 2018-02-12 DIAGNOSIS — I340 Nonrheumatic mitral (valve) insufficiency: Secondary | ICD-10-CM | POA: Diagnosis not present

## 2018-02-12 DIAGNOSIS — G9341 Metabolic encephalopathy: Secondary | ICD-10-CM | POA: Diagnosis not present

## 2018-02-12 DIAGNOSIS — F251 Schizoaffective disorder, depressive type: Secondary | ICD-10-CM | POA: Diagnosis present

## 2018-02-12 DIAGNOSIS — I071 Rheumatic tricuspid insufficiency: Secondary | ICD-10-CM | POA: Diagnosis not present

## 2018-02-12 DIAGNOSIS — S069X9S Unspecified intracranial injury with loss of consciousness of unspecified duration, sequela: Secondary | ICD-10-CM | POA: Diagnosis not present

## 2018-02-12 DIAGNOSIS — F259 Schizoaffective disorder, unspecified: Secondary | ICD-10-CM | POA: Diagnosis not present

## 2018-02-12 DIAGNOSIS — R571 Hypovolemic shock: Secondary | ICD-10-CM | POA: Diagnosis not present

## 2018-03-12 DEATH — deceased

## 2019-01-05 IMAGING — CT CT HEAD W/O CM
2 of 6 series · 12 of 47 positions shown, 15 images · non-contrast
Comparison: Prior CT 10/22/2003.

CLINICAL DATA: Initial evaluation for balance issues.

EXAM:
CT HEAD WITHOUT CONTRAST
TECHNIQUE: Contiguous axial images were obtained from the base of the skull
through the vertex without intravenous contrast.

[Series 4: head wo · axial · 0.47mm/px · z∈[-278,-208]mm · 9 of 18 slices shown, 12 images]
[im 2/18  brain]
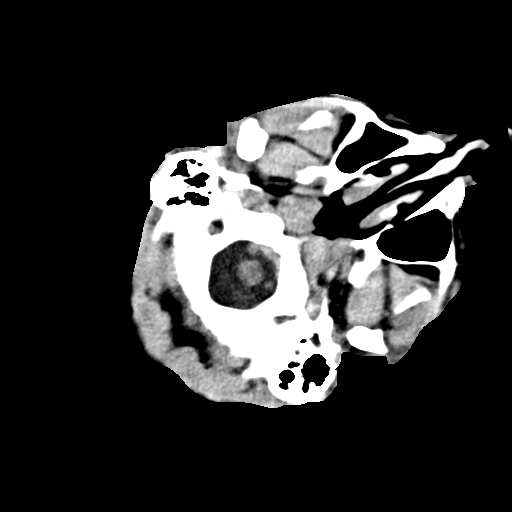
[im 2/18  bone]
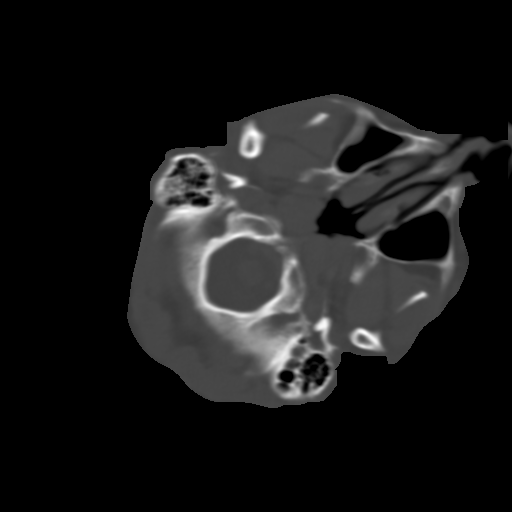
[im 4/18  brain]
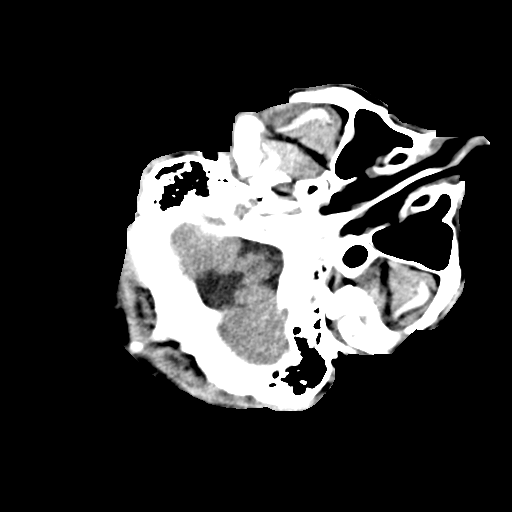
[im 6/18  brain]
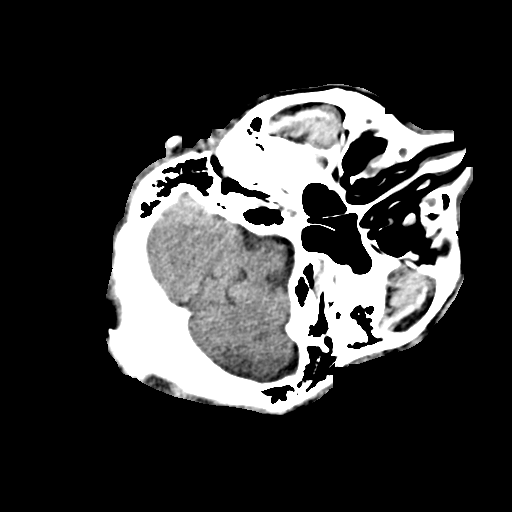
[im 7/18  brain]
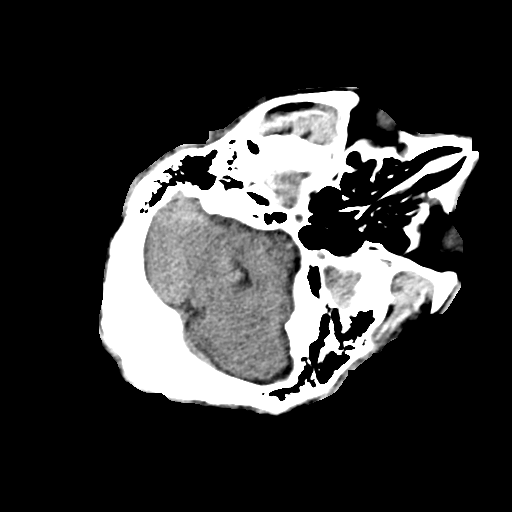
[im 9/18  brain]
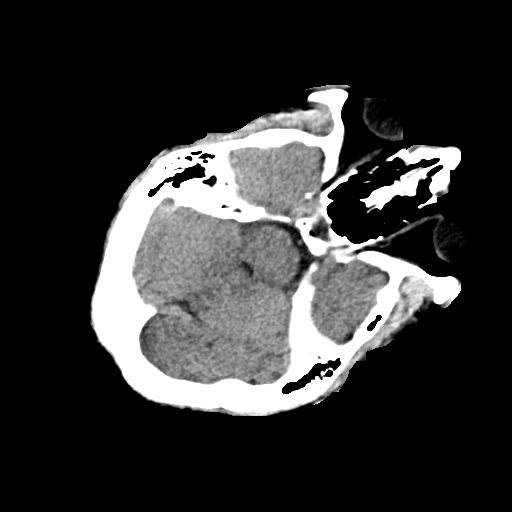
[im 9/18  bone]
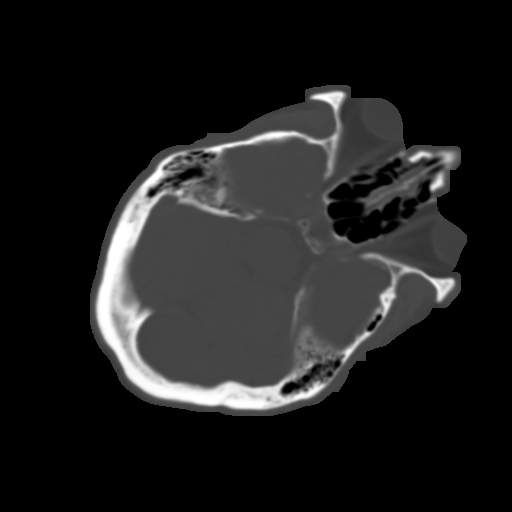
[im 11/18  brain]
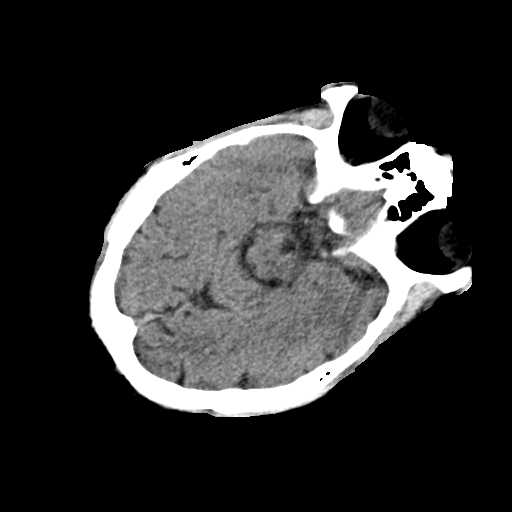
[im 12/18  brain]
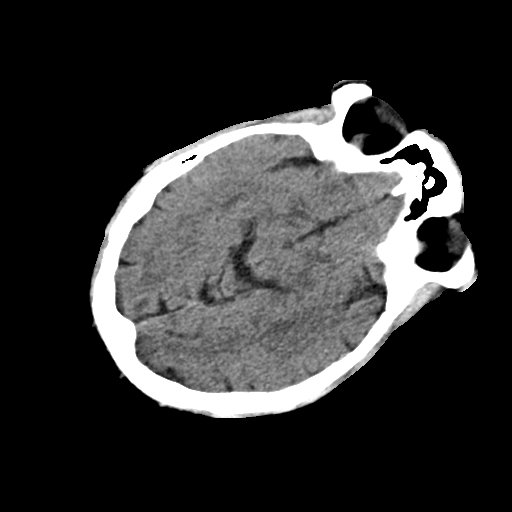
[im 14/18  brain]
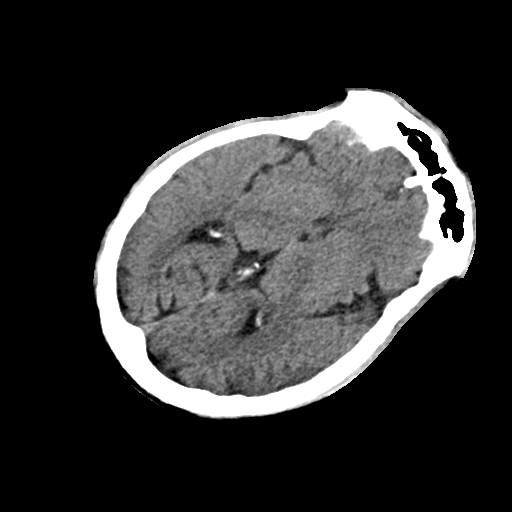
[im 16/18  brain]
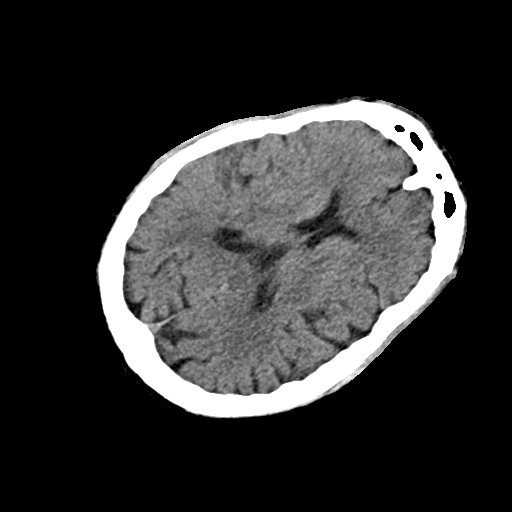
[im 16/18  bone]
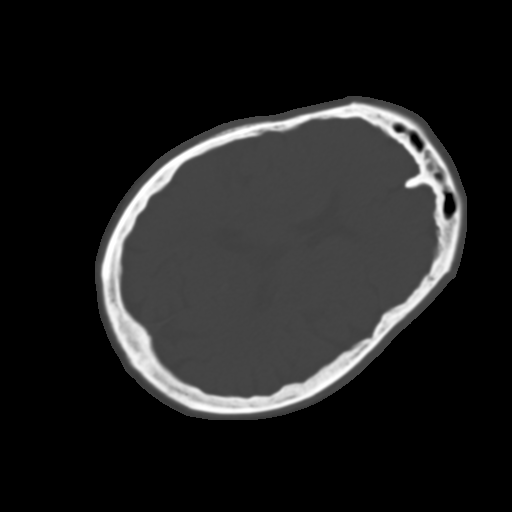

[Series 7: sag soft · coronal · 0.21mm/px · 3 of 56 slices shown]
[im 14/56  brain]
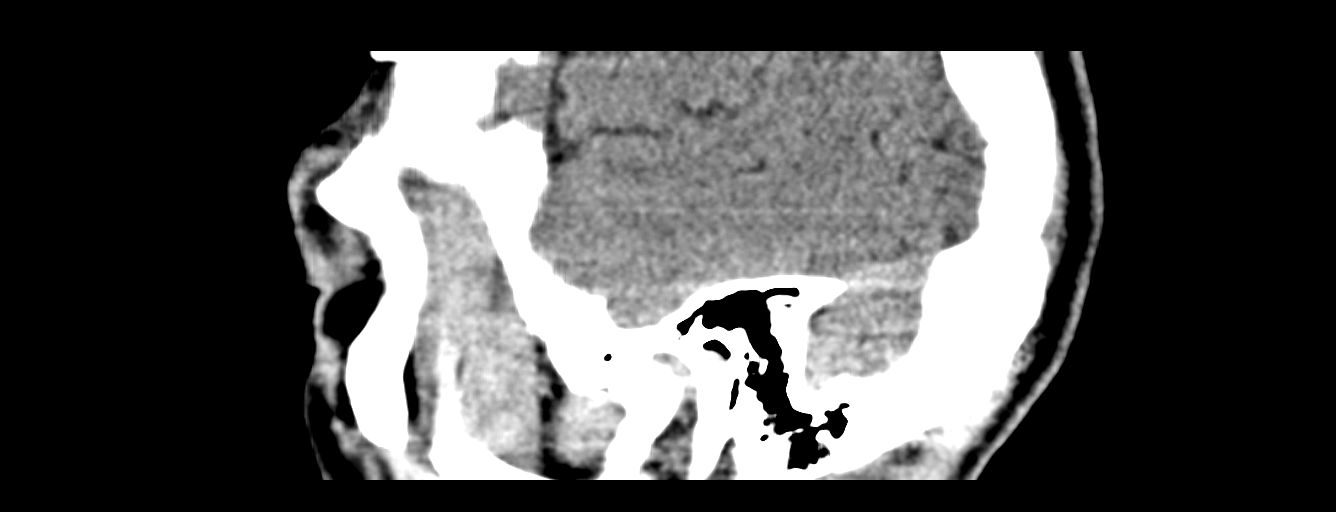
[im 28/56  brain]
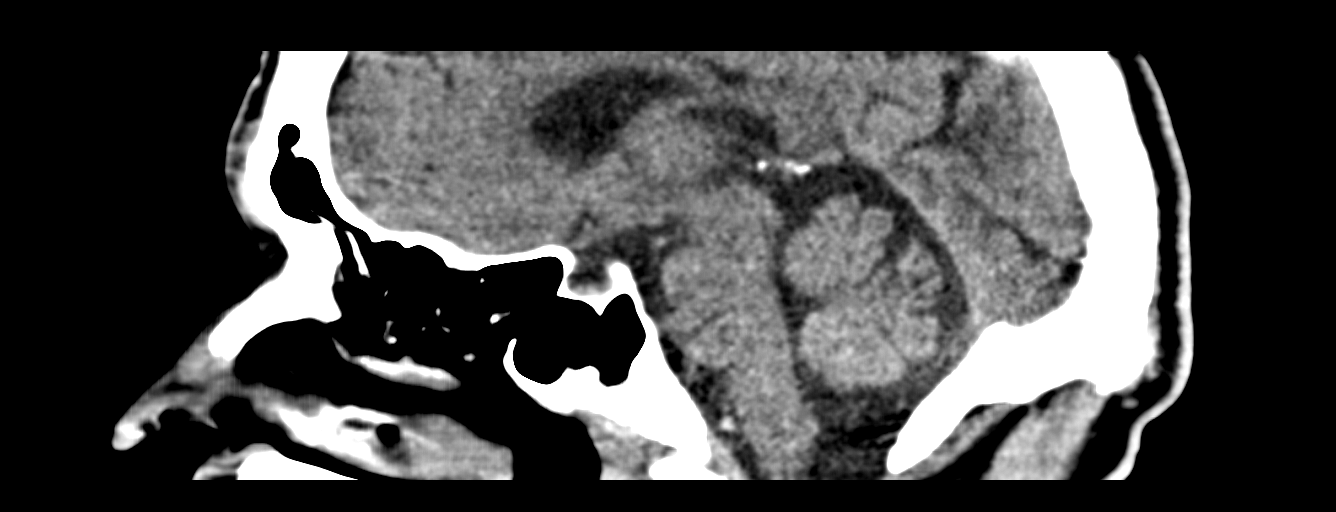
[im 42/56  brain]
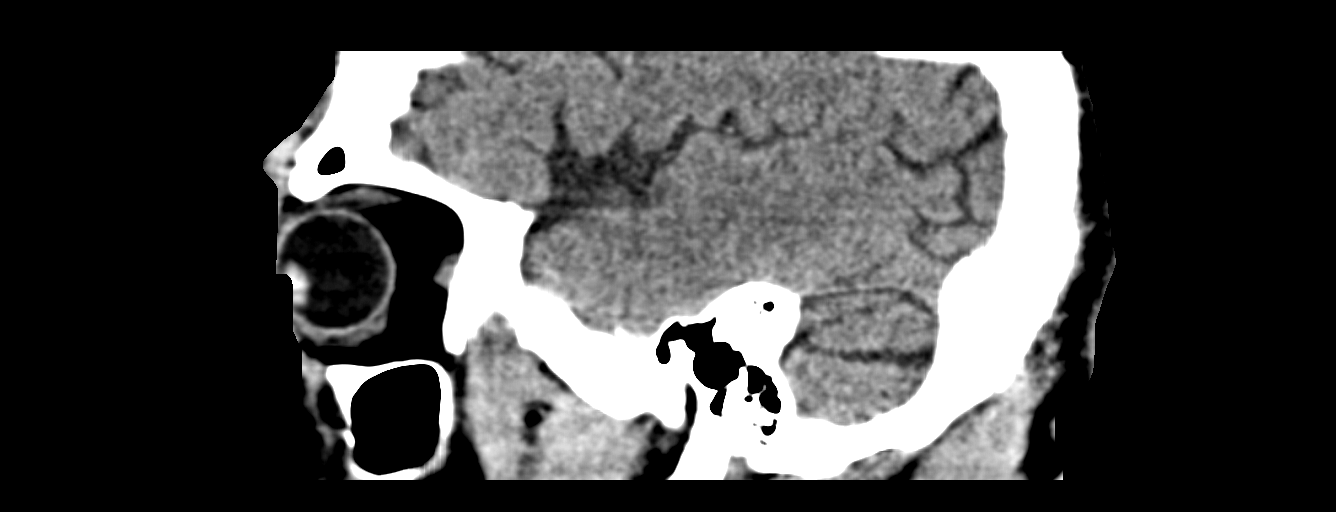

[12 of 47 positions shown; findings below may reference images not displayed]

FINDINGS: Brain: Study limited by patient positioning.

Atrophy with mild chronic small vessel ischemic disease. No acute
intracranial hemorrhage. No evidence for acute large vessel
territory infarct. No mass lesion, midline shift or mass effect. No
hydrocephalus. No extra-axial fluid collection.

Vascular: No hyperdense vessel.

Skull: Scalp soft tissues demonstrate no acute abnormality.
Calvarium intact.

Sinuses/Orbits: Globes and orbital soft tissues within normal
limits. Paranasal sinuses and mastoid air cells are clear.
IMPRESSION: 1. No acute intracranial process.
2. Generalized age-related cerebral atrophy with mild chronic small
vessel ischemic disease.

## 2019-03-29 IMAGING — DX DG CHEST 1V PORT
2 series · 2 of 2 positions shown · non-contrast
Comparison: Portable exam 1212 hours compared to Re [id number]

CLINICAL DATA: Altered mental status, history mental retardation

EXAM:
PORTABLE CHEST 1 VIEW

[chest ap (1 of 2)]
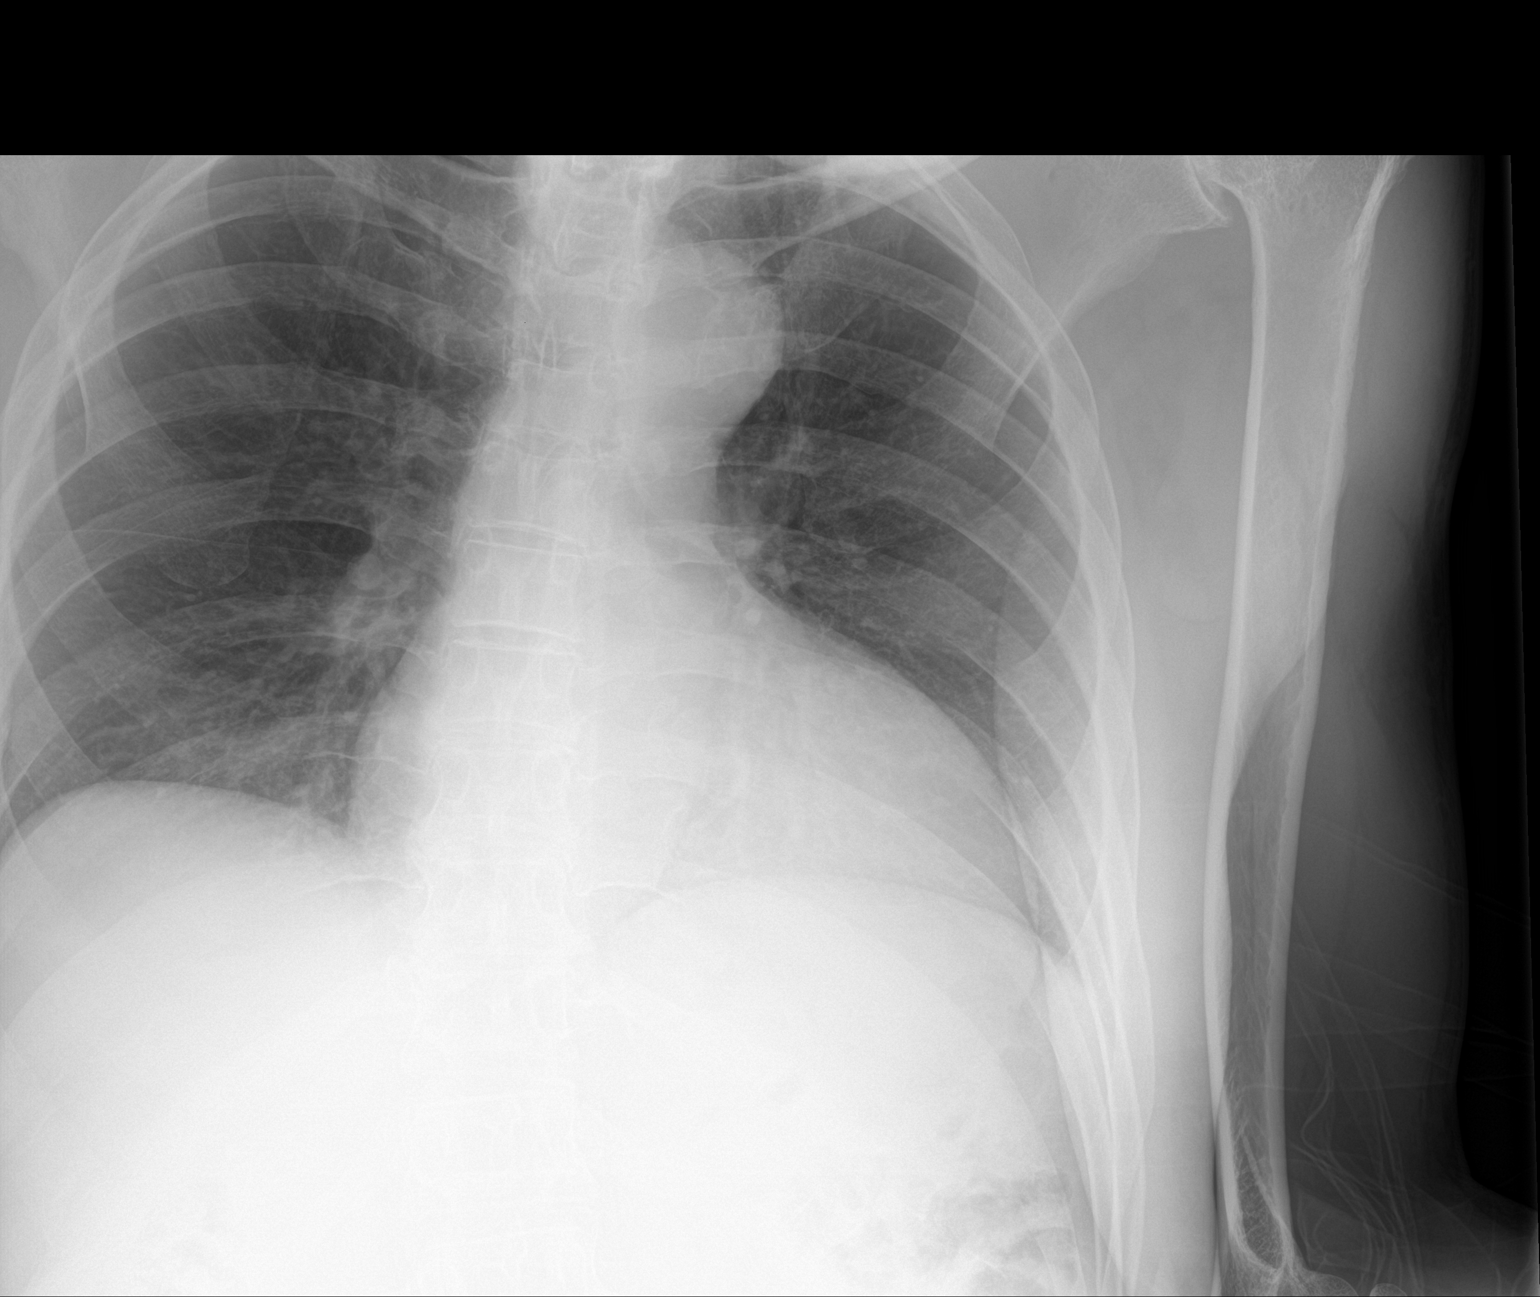

[chest ap (2 of 2)]
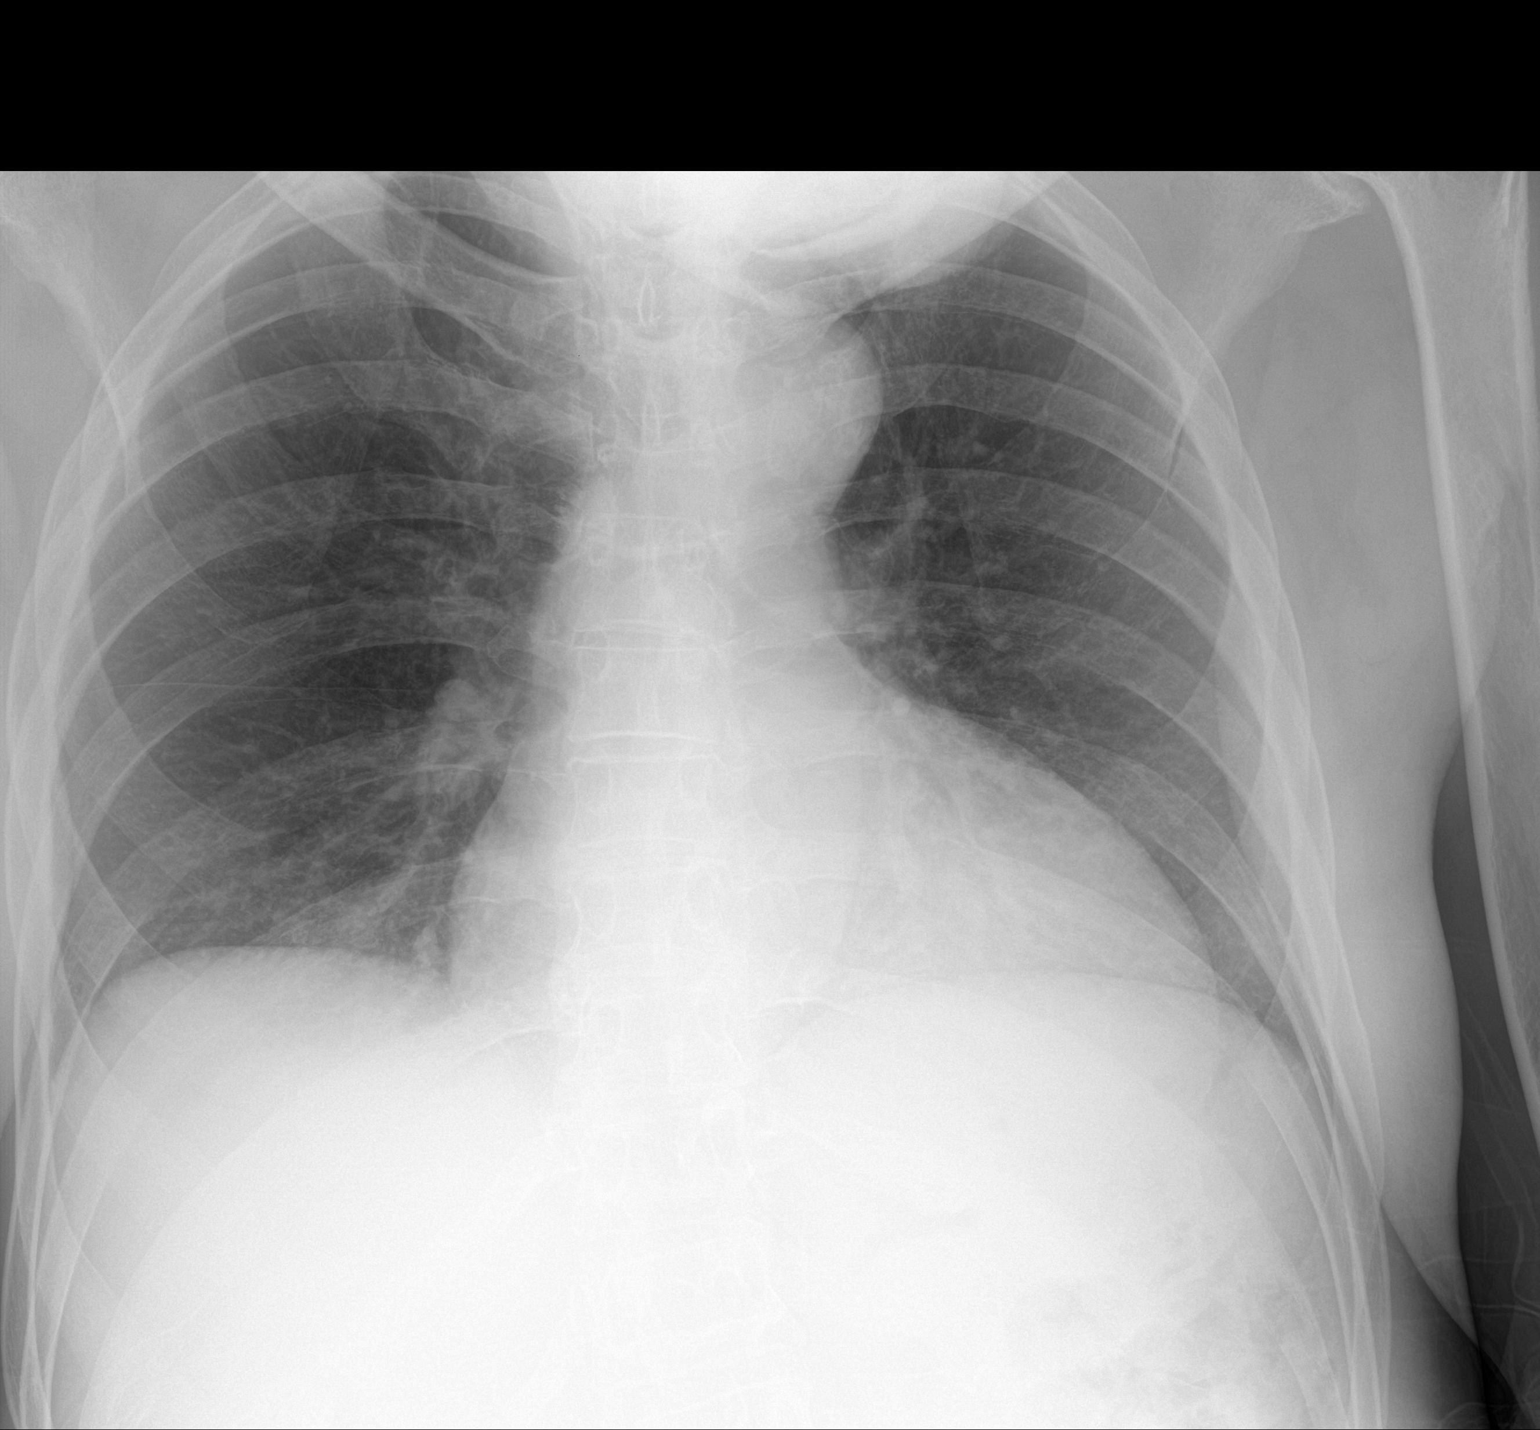

[2 of 2 positions shown; findings below may reference images not displayed]

FINDINGS: Borderline enlargement of cardiac silhouette.

Mediastinal contours and pulmonary vascularity normal.

Mild atelectasis at both lung bases similar to previous exam.

No definite acute infiltrate, pleural effusion or pneumothorax.

No acute osseous findings.

LEFT glenohumeral degenerative changes noted.
IMPRESSION: Mild bibasilar atelectasis.

## 2019-04-02 IMAGING — DX DG CHEST 1V PORT
1 series · 1 of 1 positions shown · non-contrast
Comparison: 07/19/2016

CLINICAL DATA: Mental retardation - schizophrenia - reported cough
- pt is uncooperative and does not comply with positioning or
breathing instructions - no other chest hx stated in chart

EXAM:
PORTABLE CHEST - 1 VIEW

[chest ap]
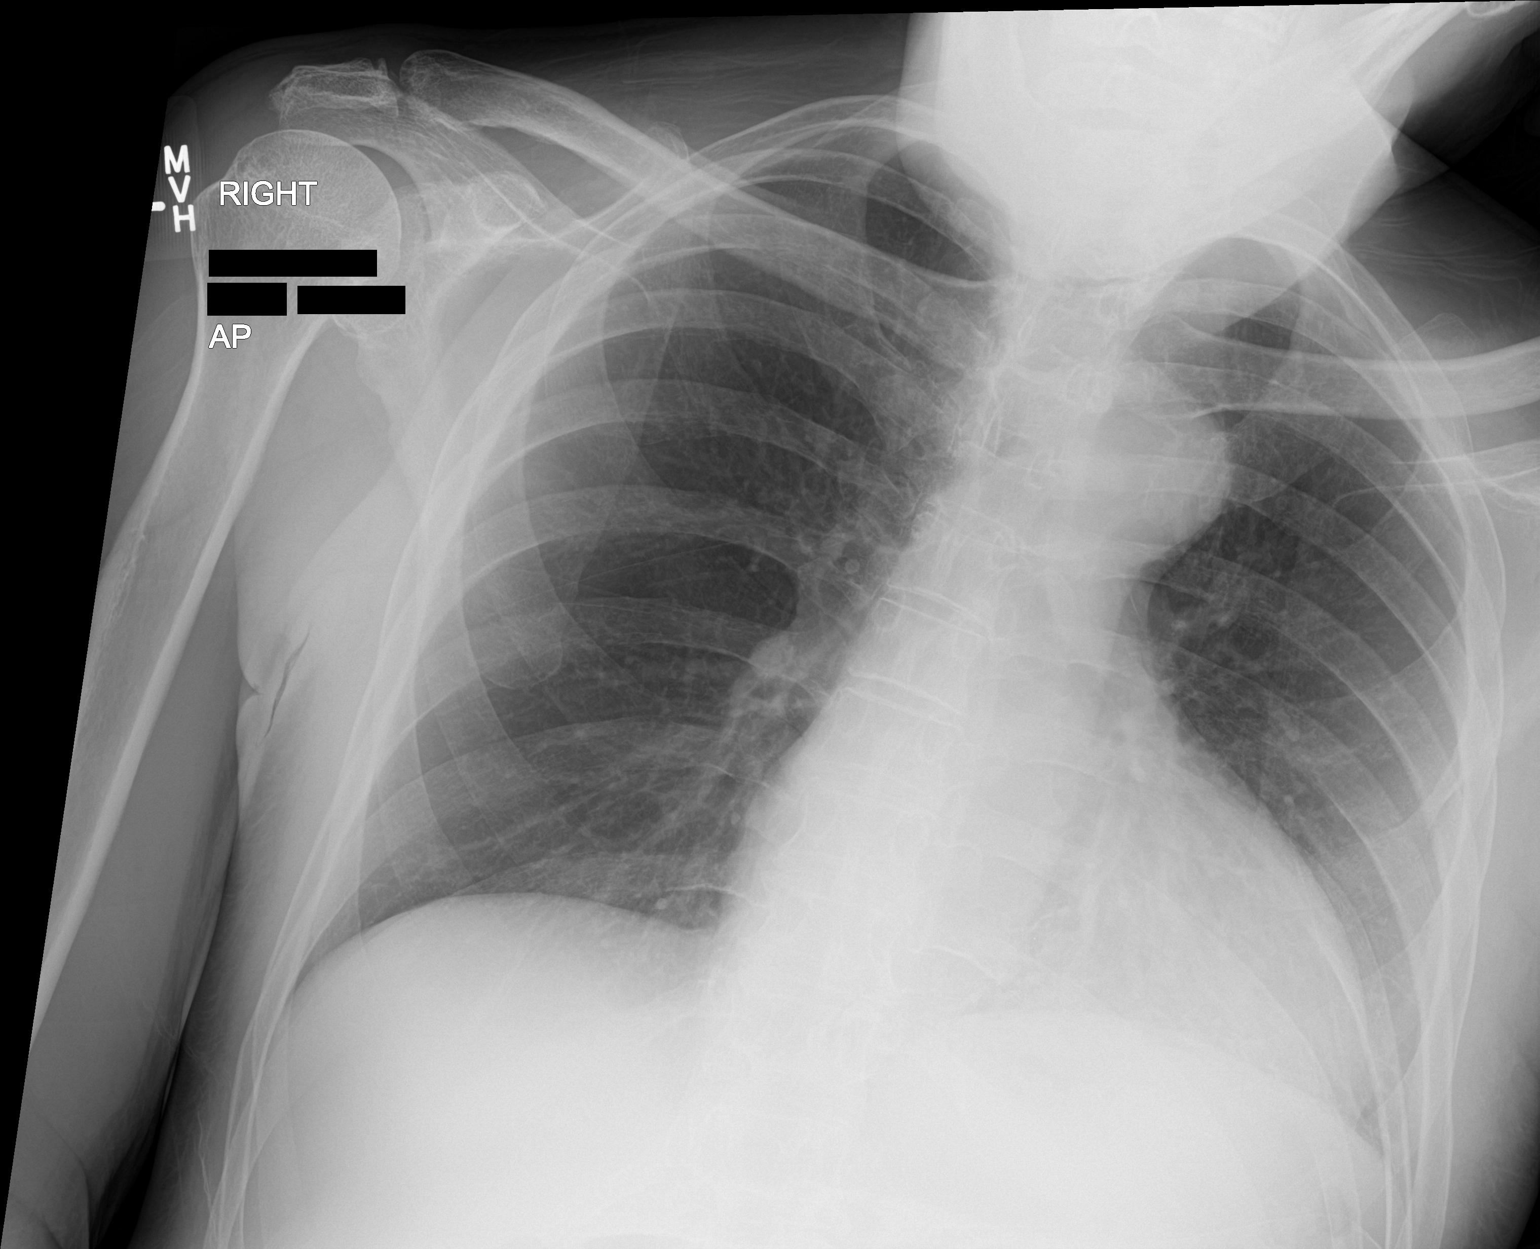

[1 of 1 positions shown; findings below may reference images not displayed]

FINDINGS: Lungs are clear.

Heart size and mediastinal contours are within normal limits.

No effusion.  No pneumothorax.

Visualized bones unremarkable.
IMPRESSION: No acute cardiopulmonary disease.
